# Patient Record
Sex: Female | Born: 1945 | Race: White | Hispanic: No | Smoking: Never smoker
Health system: Southern US, Community
[De-identification: ages and names within clinical notes are randomized; demographics above are authoritative.]

---

## 1999-03-07 ENCOUNTER — Emergency Department (HOSPITAL_COMMUNITY): Admission: EM | Admit: 1999-03-07 | Discharge: 1999-03-08 | Payer: Self-pay | Admitting: Emergency Medicine

## 2006-03-29 ENCOUNTER — Emergency Department (HOSPITAL_COMMUNITY): Admission: EM | Admit: 2006-03-29 | Discharge: 2006-03-30 | Payer: Self-pay | Admitting: Emergency Medicine

## 2009-03-15 ENCOUNTER — Inpatient Hospital Stay (HOSPITAL_COMMUNITY): Admission: EM | Admit: 2009-03-15 | Discharge: 2009-03-19 | Payer: Self-pay | Admitting: Emergency Medicine

## 2009-03-17 ENCOUNTER — Encounter (INDEPENDENT_AMBULATORY_CARE_PROVIDER_SITE_OTHER): Payer: Self-pay | Admitting: General Surgery

## 2009-04-10 ENCOUNTER — Encounter (INDEPENDENT_AMBULATORY_CARE_PROVIDER_SITE_OTHER): Payer: Self-pay

## 2011-01-29 LAB — COMPREHENSIVE METABOLIC PANEL
ALT: 43 U/L — ABNORMAL HIGH (ref 0–35)
AST: 25 U/L (ref 0–37)
AST: 28 U/L (ref 0–37)
AST: 36 U/L (ref 0–37)
AST: 41 U/L — ABNORMAL HIGH (ref 0–37)
Albumin: 2.3 g/dL — ABNORMAL LOW (ref 3.5–5.2)
Albumin: 2.5 g/dL — ABNORMAL LOW (ref 3.5–5.2)
Albumin: 2.6 g/dL — ABNORMAL LOW (ref 3.5–5.2)
Albumin: 3.7 g/dL (ref 3.5–5.2)
Alkaline Phosphatase: 109 U/L (ref 39–117)
Alkaline Phosphatase: 113 U/L (ref 39–117)
Alkaline Phosphatase: 117 U/L (ref 39–117)
Alkaline Phosphatase: 86 U/L (ref 39–117)
BUN: 16 mg/dL (ref 6–23)
BUN: 3 mg/dL — ABNORMAL LOW (ref 6–23)
BUN: 5 mg/dL — ABNORMAL LOW (ref 6–23)
CO2: 23 mEq/L (ref 19–32)
CO2: 25 mEq/L (ref 19–32)
CO2: 27 mEq/L (ref 19–32)
Calcium: 7.8 mg/dL — ABNORMAL LOW (ref 8.4–10.5)
Calcium: 9.1 mg/dL (ref 8.4–10.5)
Chloride: 105 mEq/L (ref 96–112)
Chloride: 105 mEq/L (ref 96–112)
Chloride: 109 mEq/L (ref 96–112)
Creatinine, Ser: 0.6 mg/dL (ref 0.4–1.2)
Creatinine, Ser: 0.87 mg/dL (ref 0.4–1.2)
GFR calc Af Amer: >60 mL/min (ref >60)
GFR calc Af Amer: >60 mL/min (ref >60)
GFR calc Af Amer: >60 mL/min (ref >60)
GFR calc Af Amer: >60 mL/min (ref >60)
GFR calc Af Amer: >60 mL/min (ref >60)
GFR calc non Af Amer: >60 mL/min (ref >60)
Glucose, Bld: 102 mg/dL — ABNORMAL HIGH (ref 70–99)
Glucose, Bld: 147 mg/dL — ABNORMAL HIGH (ref 70–99)
Potassium: 3.2 mEq/L — ABNORMAL LOW (ref 3.5–5.1)
Potassium: 3.4 mEq/L — ABNORMAL LOW (ref 3.5–5.1)
Potassium: 3.6 mEq/L (ref 3.5–5.1)
Sodium: 137 mEq/L (ref 135–145)
Sodium: 137 mEq/L (ref 135–145)
Total Bilirubin: 0.4 mg/dL (ref 0.3–1.2)
Total Bilirubin: 0.7 mg/dL (ref 0.3–1.2)
Total Bilirubin: 1 mg/dL (ref 0.3–1.2)
Total Bilirubin: 1 mg/dL (ref 0.3–1.2)
Total Bilirubin: 1.6 mg/dL — ABNORMAL HIGH (ref 0.3–1.2)
Total Protein: 5.1 g/dL — ABNORMAL LOW (ref 6.0–8.3)
Total Protein: 5.5 g/dL — ABNORMAL LOW (ref 6.0–8.3)

## 2011-01-29 LAB — CBC
HCT: 35.9 % — ABNORMAL LOW (ref 36.0–46.0)
HCT: 37.5 % (ref 36.0–46.0)
Hemoglobin: 13.3 g/dL (ref 12.0–15.0)
MCHC: 33.4 g/dL (ref 30.0–36.0)
MCHC: 34.2 g/dL (ref 30.0–36.0)
MCV: 93.6 fL (ref 78.0–100.0)
MCV: 94.9 fL (ref 78.0–100.0)
Platelets: 258 10*3/uL (ref 150–400)
Platelets: 283 10*3/uL (ref 150–400)
Platelets: 289 10*3/uL (ref 150–400)
RBC: 3.79 MIL/uL — ABNORMAL LOW (ref 3.87–5.11)
RBC: 4.09 MIL/uL (ref 3.87–5.11)
RBC: 4.98 MIL/uL (ref 3.87–5.11)
RDW: 12.9 % (ref 11.5–15.5)
RDW: 13 % (ref 11.5–15.5)
WBC: 12.7 10*3/uL — ABNORMAL HIGH (ref 4.0–10.5)
WBC: 14.9 10*3/uL — ABNORMAL HIGH (ref 4.0–10.5)
WBC: 26.8 10*3/uL — ABNORMAL HIGH (ref 4.0–10.5)

## 2011-01-29 LAB — URINALYSIS, ROUTINE W REFLEX MICROSCOPIC
Glucose, UA: NEGATIVE mg/dL
Ketones, ur: >80 mg/dL — AB
Nitrite: NEGATIVE

## 2011-01-29 LAB — GLUCOSE, CAPILLARY
Glucose-Capillary: 102 mg/dL — ABNORMAL HIGH (ref 70–99)
Glucose-Capillary: 111 mg/dL — ABNORMAL HIGH (ref 70–99)
Glucose-Capillary: 112 mg/dL — ABNORMAL HIGH (ref 70–99)
Glucose-Capillary: 114 mg/dL — ABNORMAL HIGH (ref 70–99)
Glucose-Capillary: 161 mg/dL — ABNORMAL HIGH (ref 70–99)
Glucose-Capillary: 85 mg/dL (ref 70–99)

## 2011-01-29 LAB — URINE CULTURE: Colony Count: 30000

## 2011-01-29 LAB — LIPID PANEL
HDL: 34 mg/dL — ABNORMAL LOW (ref >39)
LDL Cholesterol: 120 mg/dL — ABNORMAL HIGH (ref 0–99)
Triglycerides: 94 mg/dL (ref <150)
VLDL: 19 mg/dL (ref 0–40)

## 2011-01-29 LAB — URINE MICROSCOPIC-ADD ON

## 2011-01-29 LAB — DIFFERENTIAL
Basophils Absolute: 0.3 10*3/uL — ABNORMAL HIGH (ref 0.0–0.1)
Basophils Relative: 1 % (ref 0–1)
Eosinophils Relative: 0 % (ref 0–5)
Lymphocytes Relative: 4 % — ABNORMAL LOW (ref 12–46)
Lymphs Abs: 1.1 10*3/uL (ref 0.7–4.0)
Monocytes Absolute: 0.8 10*3/uL (ref 0.1–1.0)
Neutrophils Relative %: 92 % — ABNORMAL HIGH (ref 43–77)

## 2011-01-29 LAB — AMYLASE: Amylase: 33 U/L (ref 27–131)

## 2011-01-29 LAB — POTASSIUM: Potassium: 3 mEq/L — ABNORMAL LOW (ref 3.5–5.1)

## 2011-01-29 LAB — LIPASE, BLOOD: Lipase: 17 U/L (ref 11–59)

## 2011-01-29 LAB — ETHANOL: Alcohol, Ethyl (B): <5 mg/dL (ref 0–10)

## 2011-03-05 NOTE — Op Note (Signed)
NAMELENOIR, FACCHINI              ACCOUNT NO.:  [id number]   MEDICAL RECORD NO.:  [id number]          PATIENT TYPE:  INP   LOCATION:  1335                         FACILITY:  [facility]   PHYSICIAN:  Juanetta Gosling, MDDATE OF BIRTH:  11-16-1945   DATE OF PROCEDURE:  03/17/2009  DATE OF DISCHARGE:                               OPERATIVE REPORT   PREOPERATIVE DIAGNOSIS:  Gallstone pancreatitis.   POSTOPERATIVE DIAGNOSIS:  Gallstone pancreatitis.   PROCEDURE:  Laparoscopic cholecystectomy with cholangiogram.   SURGEON:  Harden Mo, M.D.   ASSISTANT:  Ollen Gross. Vernell Morgans, M.D.   ANESTHESIA:  General.   FINDINGS:  Chronically scarred and contracted gallbladder with adhesions  to the omentum and duodenum.   SPECIMEN:  Gallbladder contents to pathology.   DRAINS:  A 19-French Blake drain to gallbladder fossa.   ESTIMATED BLOOD LOSS:  50 mL.   COMPLICATIONS:  None.   DISPOSITION:  To recovery room in stable condition.   INDICATIONS:  Mrs. Egley is a 65 year old female who had developed  epigastric pain, nausea and vomiting on Mar 12, 2009, with a subjective  fever.  She was admitted on Mar 15, 2009, after her pain became worse.  She was evaluated and noted to have a white blood cell count of 26,000  and a CT consistent with acute pancreatitis at that time.  She also had  an episode similar to this in 2007.  An ultrasound showed a possible  gallstone, but a very contracted gallbladder and a common bile duct of  8.1 mm.  She also had a lipase of 1863.  She underwent conservative  therapy by the medical team and her pancreatitis has resolved, although  her white blood cell count is still 14, clinically she has completely  resolved this.  Dr. Karie Soda had initially seen her in consult and  recommended a lap cholecystectomy when her pancreatitis had resolved.  I  saw her today for Dr. Michaell Cowing and evaluated her.  I think that she is  ready for  a laparoscopic cholecystectomy.  I  counseled for a lap and  possible open cholecystectomy.   DESCRIPTION OF PROCEDURE:  After an informed consent was obtained, the  patient was taken to the operating room.  She was administered 2 grams  of intravenous cefazolin.  Sequential compression devices were placed on  her lower extremities prior to the operation.  She was then placed under  general endotracheal anesthesia without complication.  Her abdomen was  then prepped and draped in the standard sterile surgical fashion.  A  surgical time-out was then performed.   A 10-mm vertical incision was then made below her umbilicus, and  dissection was carried out down to the level of her fascia.  This was  then grasped with the Kocher clamp and incised sharply.  Her peritoneum  was then entered bluntly with my finger.  A #0-Vicryl pursestring suture  was then placed through the fascia and a Hassan trocar was introduced.  The abdomen was insufflated with 15 mmHg pressure.  She was then placed  in the reverse Trendelenburg position.  Three further  5 mm ports were  placed in the epigastrium and the right side of the abdomen under direct  vision, after infiltration with local anesthetic.  Her gallbladder was  noted to be very densely adherent to the omentum, as well as the  duodenum.  A combination of blunt dissection with clips overlying the  vessels in the omentum, as well as some judicious electrocautery were  used, and the omentum and duodenum were taken down from the gallbladder.  The duodenum was healthy-appearing following this.  Her gallbladder was  very difficult to identify and eventually was able to be retracted  cephalad and a little bit lateral.  A combination of blunt dissection  and suction were then used to identify what appeared to be the cystic  duct.  I eventually was able to obtain a critical view of safety, seeing  the cystic duct and a very small cystic artery next to the duct, as well  as being able to identify  the common bile duct.  The cystic artery was  separated from what appeared to be the cystic duct.  This was clipped  and then divided.  The cystic duct was then very clearly identified.  A  clip was placed distally.  The [facility] catheter was introduced.  A  ductotomy was then made.  The catheter was easily placed into the cystic  duct.  A cholangiogram was then performed, which showed a longer cystic  duct, as well as a very large common bile duct and even intrahepatic  radicals.  Both sides of the liver filled.  The common duct did not  appear to have any stones in it, and there was noted to be some filling  of the duodenum associated with this.  Following this, I then removed  the catheter and placed three clips proximal on the cystic duct and then  divided the cystic duct.  There was a posterior branch of the cystic  artery also identified.  This was clipped and divided as well.  The  gallbladder was removed with electrocautery.  A small hole was made in  the gallbladder, with leakage of bile, but no stones were able leak.  This was all controlled.  The gallbladder was eventually removed off the  bed of the liver some difficulty, due to its adherence to the liver.  This was then placed in an EndoCatch and removed through the umbilicus.  Following this, I then irrigated copiously.  Hemostasis was obtained on  the liver with Bovie electrocautery.  This was clear.  I then placed a  piece of Surgicel on the bed of the liver, down towards the triangle of  Calot, at a couple of areas that I did not want to cauterize.  This was  all hemostatic upon completion.  I placed a 19-French Blake drain  through the right lateral port as well.  All of the fluid was evacuated.  I removed the trocars, after I watched the umbilical trocar get  tied down.  The abdomen was then desufflated.  All wounds were then  closed with #4-0 Monocryl in a subcuticular fashion.  Dermabond was  placed over all these.   She  tolerated this well and was transferred to the recovery room in  stable condition.      Juanetta Gosling, MD  Electronically Signed     MCW/MEDQ  D:  03/17/2009  T:  03/17/2009  Job:  [id number]   cc:   Iva Boop, MD,FACG  [facility]  [address]  [place], [place] [address]   Ardeth Sportsman, MD  [address] [place] [address]

## 2011-03-05 NOTE — Discharge Summary (Signed)
NAMEWESTON, FULCO              ACCOUNT NO.:  [id number]   MEDICAL RECORD NO.:  [id number]          PATIENT TYPE:  INP   LOCATION:  1335                         FACILITY:  [facility]   PHYSICIAN:  Theodosia Paling, MD    DATE OF BIRTH:  02-27-1946   DATE OF ADMISSION:  03/15/2009  DATE OF DISCHARGE:  03/19/2009                               DISCHARGE SUMMARY   PRIMARY CARE PHYSICIAN:  None.   ADMITTING HISTORY:  Please refer to the excellent admission note  dictated by Dr. Derenda Mis under history of present illness.   DISCHARGE DIAGNOSIS:  Acute biliary pancreatitis.   DISCHARGE MEDICATIONS:  Include Percocet 5/325 mg p.o. q.6 hours p.r.n.   HOSPITAL COURSE:  Following issues were addressed during the  hospitalization.  Acute biliary pancreatitis.  The patient received a CT  scan which is consistent with pancreatitis.  Ultrasound showed  gallstones.  [place] gastro[facility] specialists were contrast  contacted for pancreatitis who recommended general surgery as ultrasound  was suggestive of acute cholecystitis.  Dr. Dwain Sarna evaluated the  patient.  On Mar 17, 2009 Dr. Dwain Sarna performed cholecystectomy.  The  patient's postoperative period was without event.  The patient tolerated  the surgery very well.  At the time of discharge the patient is  hemodynamically stable and clinically asymptomatic.   CONSULTATION:  Performed by [facility] and by Dr. Sondra Come for biliary  pancreatitis.   IMAGING PERFORMED:  As mentioned in HPI including IOP intraoperative  cholangiogram showing dilated intra and extrahepatic biliary tree.  Ultrasound of the abdomen showing gallstones, small, contracted  gallbladder.  CT scan was consistent with pancreatitis and extensive  diverticular changes in sigmoid colon.   DISPOSITION:  The patient will follow up with Dr. Dwain Sarna in 2 to 3  weeks' time.  The patient will also establish a primary care physician  for cancer screening and general well  being examination.  Total time  spent in discharge of this patient, 45 minutes.      Theodosia Paling, MD  Electronically Signed     NP/MEDQ  D:  03/19/2009  T:  03/19/2009  Job:  [id number]   cc:   Juanetta Gosling, MD  [address]  [place] [address]

## 2011-03-05 NOTE — Consult Note (Signed)
NAMEKHILEY, LIESER              ACCOUNT NO.:  [id number]   MEDICAL RECORD NO.:  [id number]          PATIENT TYPE:  INP   LOCATION:  1335                         FACILITY:  [facility]   PHYSICIAN:  Ardeth Sportsman, MD     DATE OF BIRTH:  10-23-1945   DATE OF CONSULTATION:  03/16/2009  DATE OF DISCHARGE:                                 CONSULTATION   REASON FOR CONSULTATION:  Recurrent acute pancreatitis, question biliary  etiology.   HISTORY OF PRESENT ILLNESS:  Sandra Parrish is a pleasant 65 year old female  who tends to avoid doctors who has had intermittent episodes of nausea  and vomiting and abdominal pain in the past. She had an episode in 2007.  Came to the emergency room and had an elevated amylase and lipase and  evaluation concerning for an ultrasound that showed a contracted  gallbladder with possible stones. It was thought to perhaps be biliary  in nature. Recommendation was made for admission but the compromise was  made when she followed up with gastroenterology with Dr. Jeani Hawking.  She does not recall if she ever did this and there are no records that I  can find.   She had recovered since that time and 5 days ago, started having severe  upper abdominal epigastric pain with severe nausea, vomiting, and  retching. She thought perhaps she had a stomach flu and tried transition  to liquids only but it got to the point where she could not keep things  down and came to the emergency room and was admitted yesterday via  hospital service after a CT scan showed evidence of pancreatitis.  Interesting, her lipase was not elevated at this time and her ALT was  only slightly elevated. Her white count was 23,000. Over the past 24  hours, she is feeling better with the pain decreased with the help of  some narcotics, although she is not using as much and she is starting to  tolerate some sips. She is walking around okay.   The patient denies any history of any alcohol consumption.  Had a  negative alcohol level when she came in. No history of any lipid  disorders and has normal triglycerides at this time. No history of  spider bites.   MEDICATIONS:  She is on no medications at all.   FAMILY HISTORY:  She has a family notable for a mother who had gallstone  problems in her 6's and she passed away from Alzheimer's. Father has  history of diabetes.   The patient said she denies any history of biliary colic and no  postprandial nausea and vomiting. She tends to avoid heavy greasy foods  anyway but she also said that is a personal choice. Cannot really tells  me why she avoids them. Denies any heartburn or reflux. No dysphagia to  solids or liquids. No hematochezia or melena. She normally has a bowel  movement about every day. No history of irritable bowel syndrome, IBD or  Crohn's. No history of stomach ulcers. No history of pneumonias.  Otherwise, she has pretty good exercise tolerance.   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 She has  not seen a physician in over 10 years.   PAST SURGICAL HISTORY:  She denies, although she did have an ankle  fracture in the past.   PAST GYN HISTORY:  Last menstrual period was 12 years ago. Last pap  smear was 11 years ago. She has not had a breast examination or  mammogram since age 89, which she tells me was negative.   ALLERGIES:  NO KNOWN DRUG ALLERGIES.   MEDICATIONS:  She occasionally takes some aspirin on account of  headaches, but rarely does that. She has not been on any other  medications recently.   REVIEW OF SYSTEMS:  As noted per HPI.  CONSTITUTION:  No fever, chills,  or sweats. No weight gain or weight loss. PSYCHIATRIC:  Denies history  of any auditory or visual hallucinations. No history of depression or  anxiety. HEENT:  Eyes, no double vision, blurred vision, or loss. No ear  drainage. No sore throats or recent sinusitis. NEUROLOGIC:  No focal  weakness or deficits.  MUSCULOSKELETAL:  No joint pain or recent injury.  PULMONARY:  No productive cough or sputum. No history of asthma or  wheezing. CARDIOVASCULAR:  No exertional chest pain or palpitations.  HEME/LYMPH/ALLERGIC/DERMATOLOGIC/BREAST:  Negative. GYN:  As per HPI,  otherwise negative.   SOCIAL HISTORY:  She works at the __________. She has a distant history  of tobacco, probably about 20 pack years but has not smoked in several  decades. She claims that she has occasional glass of wine. She denies  drinking any alcohol in the past few weeks. She is divorced without any  children.   PHYSICAL EXAMINATION:  VITAL SIGNS:  She has been afebrile since she has  been in the hospital and on ER evaluation. Current temperature is 98.5.  Pulse is around 80. Respiratory rate 16, blood pressure 151/85. She is  97% saturation on room air. Her glucose is 89 to 105.  GENERAL:  A well developed, well nourished, overweight female lying in  bed in no acute distress.  PSYCHIATRIC:  She is pleasant and interactive. Tends to be talkative but  no pressured speech.  NEUROLOGIC:  Cranial nerves 2-12 are intact and grossly 5/5 and equal  and symmetrical. No rest or intention tremors. Eyes:  Pupils are equal,  round, and reactive to light. Sclerae nonicteric or injected.  NECK:  Supple. No masses. Trachea is midline.  CHEST:  Clear to auscultation bilaterally. No wheezes, rales, or  rhonchi. No pain to rib or sternal compression.  HEENT:  Normocephalic. Moist mucous membranes. Oropharynx are clear.  HEART:  Regular rate and rhythm. No murmur, rub, or gallop.  ABDOMEN:  Soft and obese but with only some mild epigastric discomfort.  No Murphy sign. No focal tenderness. No umbilical hernia.  GENITOURINARY:  No inguinal hernias, no obvious vaginal discharge.  RECTAL:  Deferred.  MUSCULOSKELETAL:  Normal range of motion of the shoulders, elbows,  wrists, as well as hips, knees, and ankles.  SKIN:  No obvious petechiae or  purpura. No obvious jaundice. No spider  angiomas or telangiectasias.  LYMPH:  No head, neck, axillary or groin lymphadenopathy.  BACK:  No pain on cervical, thoracolumbar, sacral spine or costophrenic  angle on palpation.  BREAST:  Deferred.   LABORATORY DATA:  Her current comprehensive metabolic panel was rather  under-whelming except for a mildly elevated ALT. Total bilirubin was 1.6  when she came in and now it  is 1. Alkaline phosphatase was normal.  Hemoglobin A1C is normal. Triglycerides are 94. White count on initial  presentation was 26 and today is 17.8.   CT scan does show some phlegmonous changes around the head and neck of  her pancreas and no discrete mass suspicious for pancreatitis. I do not  see any obvious pseudocyst. Body of pancreas is involved.  I do not see  any ductal dilatation. It is a challenge to see a contracted gallbladder  but is very small. Her duodenal sweep looks normal with no air or mass.  There is no evidence of any bowel obstruction or hernia. Her liver,  otherwise looks okay with no discrete masses.   Ultrasound shows a contracted gallbladder with a question of a stone in  it, although it is kind of hard to tell since it is so small and maybe  confused with duodenum.   ASSESSMENT/PLAN:  A 65 year old female with elevated bilirubin and  pancreatitis, suspicious for gallstone pancreatitis. There is a prior  history of elevated lipase and amylase.  1. Continue IV fluids.  2. Advised diet as tolerated.  3. Make NPO after midnight.  4. Diagnostic laparoscopy with laparoscopic cholecystectomy and      cholangiogram.   Anatomy and physiology of hepatobiliary and pancreatic function was  discussed. Pathophysiology of cholecystolithiasis with natural history  risks were discussed. Options and recommendations were made for  laparoscopic study of the gallbladder and study of bile ducts. Risks,  benefits, and alternatives were discussed. My concern is  that she has  already had a couple of attacks adn alcohol has been disproven.  Triglycerides have been disproven. There is no other medication to blame  or other behaviors to blame and she does have and elevated bilirubin on  this admission. I think the risk of surgery in her are low and the risk  of another attack are significant. She is anxious to proceed with  surgery.   I would like to wait until her white count is more normalized and her  pain has gone down. Hopefully, this can happen within 1 to 2 days,  depending on how she is feeling. My partners will be available to help  cover for this later in the week.      Ardeth Sportsman, MD  Electronically Signed     SCG/MEDQ  D:  03/16/2009  T:  03/16/2009  Job:  [id number]   cc:   Dr. Larey Days - [facility]   Iva Boop, MD,FACG  [facility]  [address] [place], [place] [address]   Jordan Hawks. Elnoria Howard, MD  Fax: [phone]

## 2011-03-05 NOTE — H&P (Signed)
NAME:  Sandra Parrish, CAMPUS              ACCOUNT NO.:  [id number]   MEDICAL RECORD NO.:  [id number]          PATIENT TYPE:  EMS   LOCATION:  ED                           FACILITY:  [facility]   PHYSICIAN:  Melissa L. Ladona Ridgel, MD  DATE OF BIRTH:  [birth year]   DATE OF ADMISSION:  03/15/2009  DATE OF DISCHARGE:                              HISTORY & PHYSICAL   CHIEF COMPLAINT:  Stomach flu.   HISTORY OF PRESENT ILLNESS:  The patient is a 65 year old white female  who stated on Sunday she developed symptoms of what she thought was a  stomach flu.  Symptoms consisted of nausea, vomiting.  She persisted  in having the nausea and vomiting through the first of the weekend.  Had  some associated pain with it intermittently.  She was unable to keep  anything down including Jello or fluids.  The patient came to the  emergency room for further evaluation and was found to have white count  of 26,000.  A CT scan was obtained showing acute pancreatitis.  The  patient describes the pain as intermittent, currently not present, but  located across the top of her stomach.  Food made it worse.   REVIEW OF SYSTEMS:  CONSTITUTIONAL:  Her weight is the same.  There is  no fever or chills.  Eyes:  No blurred vision, double vision.  EARS/NOSE/THROAT:  No tinnitus, dysphagia or discharge.  CARDIOVASCULAR:  No chest pain or palpitations.  PULMONARY:  No shortness of breath or  cough.  GI:  Positive nausea.  Positive vomiting.  No diarrhea.  Her  last bowel movement was on Saturday, it was formed.  GU:  No hesitancy,  frequency or dysuria.  NEUROLOGICAL:  No headaches or seizure disorder.  MUSCULOSKELETAL:  No deformity or pain.  HEMATOLOGIC:  No bleeding  disorder.  PSYCHIATRIC:  No depression or anxiety.   ALLERGIES:  NO KNOWN DRUG ALLERGIES.   MEDICATIONS:  She takes no medications except for an occasional aspirin  over-the-counter for headache.   PAST MEDICAL HISTORY:  She had an ankle fracture.  Her last  menstrual  period was 12 years ago.  Her last Pap was at the age of 21.  Her last  breast exam was at the age of 72.  She has not seen a primary care  physician since approximately 10-11 years.   PAST SURGICAL HISTORY:  None.   Reviewed the old records and indicates that the patient had a bout of  abdominal pain back in 2007 for which she was seen in the emergency  room.  At that time, her LFTs were slightly elevated.  Her lipase was  elevated at 14,000 and she clinically had evidence for pancreatitis.  Evidently, the patient left the emergency room against medical advice  before being seen by a GI physician.  The patient does not recall why  she left.  She does not recall what occurred after the fact, but  evidently she got better.   FAMILY HISTORY:  Dad is deceased with history of diabetes.  Mom is  deceased with history of Alzheimer's.  SOCIAL HISTORY:  She is divorced.  She has no children.  She works as a  Diplomatic Services operational officer for [employer].  The patient quit tobacco  many years ago.  She has an occasional glass of wine.   PHYSICAL EXAMINATION:  VITAL SIGNS:  Temperature 98.6, blood pressure  118/57, pulse 83, respirations 16, saturation 94%.  GENERAL:  She is in no acute distress at present.  HEENT:  Normocephalic, atraumatic.  Pupils are equal, round and reactive  to light.  Extraocular muscles are intact.  She has anicteric sclerae.  Examination of the ears reveal tympanic membranes are obscured by  cerumen on the right.  TM on the left is clear.  No discharge.  Examination does reveal septum midline without discharge.  Examination  of the mouth reveals no oral lesions.  Dentition is intact.  There are  no lip lesions.  NECK:  Supple.  There is no JVD.  No lymph nodes.  No carotid bruits.  CHEST:  Clear to auscultation.  There are no rhonchi, rales or wheezes.  CARDIOVASCULAR:  Regular rate and rhythm.  Positive S1 and S2.  No S3 or  S4.  No murmurs, rubs or  gallops.  ABDOMEN:  Soft.  Currently she is nontender.  She has only a rare  occasional bowel sound.  There is no guarding.  There is no rebound.  There is no hepatosplenomegaly.  I do not appreciate any bruits.  EXTREMITIES:  No clubbing, cyanosis or edema.  She has a mildly deformed  hallux  bilaterally of the feet, otherwise no lesions are noted.  She  has 2+ pulses.  NEUROLOGIC:  She is awake, alert, oriented.  Cranial nerves II-XII are  intact.  Power is 5/5.  DTRs are 2+.  Plantars are downgoing.  PSYCHIATRIC:  Affect is appropriate.  Recent and remote memory are  intact.  Judgment and insight are intact.   LABORATORY:  White count is 26.8 with hemoglobin of 16.5, hematocrit  46.6, platelets of 347, sodium is 137, potassium 3.2, chloride 100, CO2  25, BUN 16, creatinine 0.87, glucose is 147, lipase is 17.  UA shows  small leukoesterase with 3-5 WBCs.   ASSESSMENT/PLAN:  This is a 65 year old white female who presents with  abdominal pain, nausea and vomiting that started over the weekend and  persisted.  In the emergency room, the patient is found to have an  elevated white blood cell count.  A CT scan was obtained which shows  diffuse pancreatitis.  The patient had an ED visit in 2007 for the same  issue, but has never been seen or evaluated for this.  1. Pancreatitis.  I will go ahead and check a STAT ultrasound to rule      out obstructing stone and look for masses.  There is no evidence      for a cyst on the CT scan.  Keep her n.p.o. except for may be      occasional ice chips and more IV fluid to hydrate her.  I will      check a lipid panel.  I will check an ethanol level.  I have      consulted [facility] because of the atypical presentation without      elevation in LFTs or lipase in this case.  I want to make sure that      we do not miss a possible obstructing lesion or other ominous cause      for her pancreatitis.  2. Genitourinary:  Slightly positive urinalysis.   In light of her      white count, we will go ahead and treat her with Levaquin.  We will      follow up the culture and discontinue if necessary.  3. Nausea with vomiting secondary to number 1.  We will treat her with      Zofran and discover the problem that is causing her pancreatitis.  4. Deep venous thrombosis prophylaxis.  Agree with either Lovenox or      heparin.   Total time in this case was from 1:15-2:15.      Melissa L. Ladona Ridgel, MD  Electronically Signed     MLT/MEDQ  D:  03/15/2009  T:  03/15/2009  Job:  [id number]   cc:   [facility]

## 2019-09-06 ENCOUNTER — Emergency Department (HOSPITAL_COMMUNITY): Payer: Self-pay

## 2019-09-06 ENCOUNTER — Other Ambulatory Visit: Payer: Self-pay

## 2019-09-06 ENCOUNTER — Emergency Department (HOSPITAL_COMMUNITY)
Admission: EM | Admit: 2019-09-06 | Discharge: 2019-09-07 | Disposition: A | Discharge status: Home / Self Care | Payer: Self-pay | Attending: Emergency Medicine | Admitting: Emergency Medicine

## 2019-09-06 DIAGNOSIS — R109 Unspecified abdominal pain: Secondary | ICD-10-CM | POA: Insufficient documentation

## 2019-09-06 DIAGNOSIS — Y999 Unspecified external cause status: Secondary | ICD-10-CM | POA: Insufficient documentation

## 2019-09-06 DIAGNOSIS — Z91040 Latex allergy status: Secondary | ICD-10-CM | POA: Insufficient documentation

## 2019-09-06 DIAGNOSIS — S0083XA Contusion of other part of head, initial encounter: Secondary | ICD-10-CM | POA: Insufficient documentation

## 2019-09-06 DIAGNOSIS — Y93E5 Activity, floor mopping and cleaning: Secondary | ICD-10-CM | POA: Insufficient documentation

## 2019-09-06 DIAGNOSIS — M549 Dorsalgia, unspecified: Secondary | ICD-10-CM

## 2019-09-06 DIAGNOSIS — Y92008 Other place in unspecified non-institutional (private) residence as the place of occurrence of the external cause: Secondary | ICD-10-CM | POA: Insufficient documentation

## 2019-09-06 DIAGNOSIS — Z23 Encounter for immunization: Secondary | ICD-10-CM | POA: Insufficient documentation

## 2019-09-06 DIAGNOSIS — W19XXXA Unspecified fall, initial encounter: Secondary | ICD-10-CM

## 2019-09-06 DIAGNOSIS — R519 Headache, unspecified: Secondary | ICD-10-CM | POA: Insufficient documentation

## 2019-09-06 DIAGNOSIS — S7001XA Contusion of right hip, initial encounter: Secondary | ICD-10-CM | POA: Insufficient documentation

## 2019-09-06 DIAGNOSIS — W010XXA Fall on same level from slipping, tripping and stumbling without subsequent striking against object, initial encounter: Secondary | ICD-10-CM | POA: Insufficient documentation

## 2019-09-06 LAB — CBC WITH DIFFERENTIAL/PLATELET
Abs Immature Granulocytes: 0.04 10*3/uL (ref 0.00–0.07)
Basophils Absolute: 0.1 10*3/uL (ref 0.0–0.1)
Basophils Relative: 1 %
Eosinophils Absolute: 0.1 10*3/uL (ref 0.0–0.5)
Eosinophils Relative: 0 %
HCT: 41.6 % (ref 36.0–46.0)
Hemoglobin: 14 g/dL (ref 12.0–15.0)
Immature Granulocytes: 0 %
Lymphocytes Relative: 7 %
Lymphs Abs: 1 10*3/uL (ref 0.7–4.0)
MCH: 30.9 pg (ref 26.0–34.0)
MCHC: 33.7 g/dL (ref 30.0–36.0)
MCV: 91.8 fL (ref 80.0–100.0)
Monocytes Absolute: 0.3 10*3/uL (ref 0.1–1.0)
Monocytes Relative: 2 %
Neutro Abs: 12.2 10*3/uL — ABNORMAL HIGH (ref 1.7–7.7)
Neutrophils Relative %: 90 %
Platelets: 327 10*3/uL (ref 150–400)
RBC: 4.53 MIL/uL (ref 3.87–5.11)
RDW: 12.1 % (ref 11.5–15.5)
WBC: 13.6 10*3/uL — ABNORMAL HIGH (ref 4.0–10.5)
nRBC: 0 % (ref 0.0–0.2)

## 2019-09-06 LAB — I-STAT CHEM 8, ED
BUN: 12 mg/dL (ref 8–23)
Calcium, Ion: 1.14 mmol/L — ABNORMAL LOW (ref 1.15–1.40)
Chloride: 106 mmol/L (ref 98–111)
Creatinine, Ser: 0.5 mg/dL (ref 0.44–1.00)
Glucose, Bld: 119 mg/dL — ABNORMAL HIGH (ref 70–99)
HCT: 41 % (ref 36.0–46.0)
Hemoglobin: 13.9 g/dL (ref 12.0–15.0)
Potassium: 3.8 mmol/L (ref 3.5–5.1)
Sodium: 140 mmol/L (ref 135–145)
TCO2: 22 mmol/L (ref 22–32)

## 2019-09-06 LAB — COMPREHENSIVE METABOLIC PANEL
ALT: 19 U/L (ref 0–44)
AST: 28 U/L (ref 15–41)
Albumin: 3.5 g/dL (ref 3.5–5.0)
Alkaline Phosphatase: 70 U/L (ref 38–126)
Anion gap: 13 (ref 5–15)
BUN: 11 mg/dL (ref 8–23)
CO2: 21 mmol/L — ABNORMAL LOW (ref 22–32)
Calcium: 8.7 mg/dL — ABNORMAL LOW (ref 8.9–10.3)
Chloride: 105 mmol/L (ref 98–111)
Creatinine, Ser: 0.59 mg/dL (ref 0.44–1.00)
GFR calc Af Amer: >60 mL/min (ref >60)
GFR calc non Af Amer: >60 mL/min (ref >60)
Glucose, Bld: 120 mg/dL — ABNORMAL HIGH (ref 70–99)
Potassium: 3.8 mmol/L (ref 3.5–5.1)
Sodium: 139 mmol/L (ref 135–145)
Total Bilirubin: 0.9 mg/dL (ref 0.3–1.2)
Total Protein: 6.6 g/dL (ref 6.5–8.1)

## 2019-09-06 MED ORDER — HYDROCODONE-ACETAMINOPHEN 5-325 MG PO TABS: 1.0000 | ORAL_TABLET | Freq: Four times a day (QID) | ORAL | 0 refills | Status: DC | PRN

## 2019-09-06 MED ORDER — TETANUS-DIPHTH-ACELL PERTUSSIS 5-2.5-18.5 LF-MCG/0.5 IM SUSP
0.5000 mL | Freq: Once | INTRAMUSCULAR | Status: AC
  Administered 2019-09-06: 0.5 mL via INTRAMUSCULAR
  Filled 2019-09-06: qty 0.5

## 2019-09-06 MED ORDER — IOHEXOL 300 MG/ML  SOLN
100.0000 mL | Freq: Once | INTRAMUSCULAR | Status: AC | PRN
  Administered 2019-09-06: 100 mL via INTRAVENOUS

## 2019-09-06 MED ORDER — MORPHINE SULFATE (PF) 4 MG/ML IV SOLN
4.0000 mg | Freq: Once | INTRAVENOUS | Status: AC
  Administered 2019-09-06: 4 mg via INTRAVENOUS
  Filled 2019-09-06: qty 1

## 2019-09-06 NOTE — Discharge Instructions (Addendum)
You bruised your hip and back   Take motrin for pain   Take vicodin for severe pain. Do NOT drive with it   See your doctor  Return to ER if you have worse back pain, hip pain, difficulty walking

## 2019-09-06 NOTE — ED Triage Notes (Signed)
Pt endorses mechanical fall when she tripped over some patio furniture landing on her right side. No loc. Not on blood thinners. Axox4. Complains of right hip pain and lower leg pain on palpation as well as right hand pain. VSS

## 2019-09-06 NOTE — ED Provider Notes (Signed)
[facility] EMERGENCY DEPARTMENT Provider Note   CSN: [id number] Arrival date & time: 09/06/19  1229     History   Chief Complaint Chief Complaint  Patient presents with   Fall    HPI [place] is a 73 y.o. female history of arthritis here presenting with a fall .  She states that she was on the porch and was cleaning some leaves off and tripped and fell and hit her face as well as right hand and right hip.  She states that she was laying on the floor unable to get up or bear any weight on her leg afterwards.  Patient was noted to have abrasion on the nose and states that her tetanus shot was over 20 years ago.     The history is provided by the patient.    No past medical history on file.  There are no active problems to display for this patient.    OB History   No obstetric history on file.      Home Medications    Prior to Admission medications   Medication Sig Start Date End Date Taking? Authorizing Provider  aspirin EC 325 MG tablet Take 650 mg by mouth daily as needed (pain/headache).   Yes [provider]    Family History No family history on file.  Social History Social History   Tobacco Use   Smoking status: Not on file  Substance Use Topics   Alcohol use: Not on file   Drug use: Not on file     Allergies   Latex   Review of Systems Review of Systems  Musculoskeletal:       R hip pain, R hand pain   All other systems reviewed and are negative.    Physical Exam Updated Vital Signs BP 109/90 (BP Location: Left Arm)    Pulse 71    Temp 98.7 F (37.1 C) (Oral)    Resp 16    SpO2 98%   Physical Exam Vitals signs and nursing note reviewed.  HENT:     Head: Normocephalic.     Nose:     Comments: Abrasion bridge of nose, no septal deviation     Mouth/Throat:     Mouth: Mucous membranes are moist.  Eyes:     Extraocular Movements: Extraocular movements intact.  Neck:     Musculoskeletal: Normal  range of motion and neck supple.     Comments: No midline tenderness  Cardiovascular:     Rate and Rhythm: Normal rate and regular rhythm.     Pulses: Normal pulses.     Heart sounds: Normal heart sounds.  Pulmonary:     Effort: Pulmonary effort is normal.     Breath sounds: Normal breath sounds.  Abdominal:     General: Abdomen is flat.     Palpations: Abdomen is soft.     Comments: Mild tenderness in RUQ. No obvious bruising   Musculoskeletal:     Comments: Dec ROM R hip, mild R SI joint vs paralumbar tenderness   Skin:    General: Skin is warm.     Capillary Refill: Capillary refill takes less than 2 seconds.  Neurological:     General: No focal deficit present.     Mental Status: She is alert.  Psychiatric:        Mood and Affect: Mood normal.        Behavior: Behavior normal.      ED Treatments / Results  Labs (all labs ordered are listed, but only abnormal results are displayed) Labs Reviewed  CBC WITH DIFFERENTIAL/PLATELET - Abnormal; Notable for the following components:      Result Value   WBC 13.6 (*)    Neutro Abs 12.2 (*)    All other components within normal limits  COMPREHENSIVE METABOLIC PANEL - Abnormal; Notable for the following components:   CO2 21 (*)    Glucose, Bld 120 (*)    Calcium 8.7 (*)    All other components within normal limits  I-STAT CHEM 8, ED - Abnormal; Notable for the following components:   Glucose, Bld 119 (*)    Calcium, Ion 1.14 (*)    All other components within normal limits    EKG None  Radiology Ct Head Wo Contrast  Result Date: 09/06/2019 CLINICAL DATA:  Mechanical fall no loss of consciousness or anticoagulation. EXAM: CT HEAD WITHOUT CONTRAST CT CERVICAL SPINE WITHOUT CONTRAST TECHNIQUE: Multidetector CT imaging of the head and cervical spine was performed following the standard protocol without intravenous contrast. Multiplanar CT image reconstructions of the cervical spine were also generated. COMPARISON:  None.  FINDINGS: CT HEAD FINDINGS Brain: No evidence of acute infarction, hemorrhage, hydrocephalus, extra-axial collection or mass lesion/mass effect. Symmetric prominence of the ventricles, cisterns and sulci compatible with parenchymal volume loss. Patchy areas of white matter hypoattenuation are most compatible with chronic microvascular angiopathy. Vascular: Atherosclerotic calcification of the carotid siphons. No hyperdense vessel. Skull: Midline frontal scalp swelling/contusion with small hematoma measuring 4 mm in maximal thickness. Sinuses/Orbits: Minimal mural thickening in the paranasal sinuses. Mastoid air cells are clear. Included orbital structures are unremarkable. Other: None CT CERVICAL SPINE FINDINGS Alignment: Straightening and slight reversal of the normal cervical lordosis centered at C5-6. No abnormal facet widening. Normal alignment of the craniocervical and atlantoaxial articulations. Skull base and vertebrae: No acute fracture. No primary bone lesion or focal pathologic process. Soft tissues and spinal canal: No pre or paravertebral fluid or swelling. No visible canal hematoma. Mild pannus formation posterior to the dens. Disc levels: Multilevel intervertebral disc height loss with spondylitic endplate changes. Diffuse facet hypertrophic changes and uncinate spurring are present as well. Features maximal at C5-6 and C6-7 with at least mild-to-moderate canal stenoses and moderate to severe bilateral neural foraminal narrowing at these levels. Upper chest: Atelectatic and hypoventilatory changes noted in the upper chest. No acute worse some abnormality. Aortic arch calcifications are noted. Other: Normal thyroid gland. IMPRESSION: 1. No acute intracranial abnormality. 2. Minimal parenchymal volume loss and chronic white matter angiopathy changes. 3. Midline frontal scalp swelling/contusion with small hematoma measuring 4 mm in maximal thickness. No calvarial fracture. 4. No acute cervical spine  fracture or traumatic listhesis. 5. Multilevel degenerative changes maximal at C5-6 and C6-7 with at least mild-to-moderate canal stenoses and moderate to severe bilateral neural foraminal narrowing at these levels. 6.  Aortic Atherosclerosis (ICD10-I70.0). Electronically Signed   By: Kreg Shropshire M.D.   On: 09/06/2019 14:06   Ct Cervical Spine Wo Contrast  Result Date: 09/06/2019 CLINICAL DATA:  Mechanical fall no loss of consciousness or anticoagulation. EXAM: CT HEAD WITHOUT CONTRAST CT CERVICAL SPINE WITHOUT CONTRAST TECHNIQUE: Multidetector CT imaging of the head and cervical spine was performed following the standard protocol without intravenous contrast. Multiplanar CT image reconstructions of the cervical spine were also generated. COMPARISON:  None. FINDINGS: CT HEAD FINDINGS Brain: No evidence of acute infarction, hemorrhage, hydrocephalus, extra-axial collection or mass lesion/mass effect. Symmetric prominence of the ventricles, cisterns and sulci  compatible with parenchymal volume loss. Patchy areas of white matter hypoattenuation are most compatible with chronic microvascular angiopathy. Vascular: Atherosclerotic calcification of the carotid siphons. No hyperdense vessel. Skull: Midline frontal scalp swelling/contusion with small hematoma measuring 4 mm in maximal thickness. Sinuses/Orbits: Minimal mural thickening in the paranasal sinuses. Mastoid air cells are clear. Included orbital structures are unremarkable. Other: None CT CERVICAL SPINE FINDINGS Alignment: Straightening and slight reversal of the normal cervical lordosis centered at C5-6. No abnormal facet widening. Normal alignment of the craniocervical and atlantoaxial articulations. Skull base and vertebrae: No acute fracture. No primary bone lesion or focal pathologic process. Soft tissues and spinal canal: No pre or paravertebral fluid or swelling. No visible canal hematoma. Mild pannus formation posterior to the dens. Disc levels:  Multilevel intervertebral disc height loss with spondylitic endplate changes. Diffuse facet hypertrophic changes and uncinate spurring are present as well. Features maximal at C5-6 and C6-7 with at least mild-to-moderate canal stenoses and moderate to severe bilateral neural foraminal narrowing at these levels. Upper chest: Atelectatic and hypoventilatory changes noted in the upper chest. No acute worse some abnormality. Aortic arch calcifications are noted. Other: Normal thyroid gland. IMPRESSION: 1. No acute intracranial abnormality. 2. Minimal parenchymal volume loss and chronic white matter angiopathy changes. 3. Midline frontal scalp swelling/contusion with small hematoma measuring 4 mm in maximal thickness. No calvarial fracture. 4. No acute cervical spine fracture or traumatic listhesis. 5. Multilevel degenerative changes maximal at C5-6 and C6-7 with at least mild-to-moderate canal stenoses and moderate to severe bilateral neural foraminal narrowing at these levels. 6.  Aortic Atherosclerosis (ICD10-I70.0). Electronically Signed   By: Kreg Shropshire M.D.   On: 09/06/2019 14:06   Ct Abdomen Pelvis W Contrast  Result Date: 09/06/2019 CLINICAL DATA:  Abdominal trauma EXAM: CT ABDOMEN AND PELVIS WITH CONTRAST TECHNIQUE: Multidetector CT imaging of the abdomen and pelvis was performed using the standard protocol following bolus administration of intravenous contrast. CONTRAST:  OMNIPAQUE IOHEXOL 300 MG/ML  SOLN COMPARISON:  01/13/2009 FINDINGS: Lower chest: Lung bases are clear. No effusions. Heart is normal size. Hepatobiliary: Prior cholecystectomy. Intrahepatic and extrahepatic biliary ductal dilatation. Common bile duct measures up to 13 mm. This may be related to patient's age and post cholecystectomy state. No focal hepatic abnormality. Pancreas: No focal abnormality or ductal dilatation. Spleen: No focal abnormality.  Normal size. Adrenals/Urinary Tract: No adrenal abnormality. No focal renal  abnormality. No stones or hydronephrosis. Urinary bladder is unremarkable. Stomach/Bowel: Extensive left colonic diverticulosis. No active diverticulitis. Normal appendix. Stomach and small bowel decompressed, unremarkable. Vascular/Lymphatic: Aortic atherosclerosis. No enlarged abdominal or pelvic lymph nodes. Reproductive: Uterus and adnexa unremarkable.  No mass. Other: No free fluid or free air. Musculoskeletal: Degenerative changes in the lumbar spine. No acute bony abnormality. IMPRESSION: No evidence of solid organ injury. Biliary ductal dilatation which may be related to post cholecystectomy state. Recommend correlation with LFTs. Extensive left colonic diverticulosis.  No active diverticulitis. Aortic atherosclerosis. No acute findings. Electronically Signed   By: Charlett Nose M.D.   On: 09/06/2019 20:55   Ct L-spine No Charge  Result Date: 09/06/2019 CLINICAL DATA:  Acute presentation with back pain. EXAM: CT LUMBAR SPINE WITHOUT CONTRAST TECHNIQUE: Multidetector CT imaging of the lumbar spine was performed without intravenous contrast administration. Multiplanar CT image reconstructions were also generated. COMPARISON:  None. FINDINGS: Segmentation: 5 lumbar type vertebral bodies. Alignment: Curvature convex to the left with the apex at L2-3. Vertebrae: No fracture or primary bone lesion. Chronic discogenic endplate changes. Paraspinal  and other soft tissues: See results of CT abdomen pelvis. Disc levels: T12-L1 and L1-2: Negative. L2-3: Chronic disc degeneration with vacuum phenomenon. Endplate osteophytes and bulging of the disc. No central canal stenosis. Lateral recess and foraminal narrowing on the right. L3-4: Disc degeneration with vacuum phenomenon. Prominent right-sided osteophytes. Bulging of the disc. Narrowing of the right lateral recess and intervertebral foramen that could cause right-sided pain. L4-5: Disc degeneration with vacuum phenomenon. Endplate osteophytes and bulging of the  disc. Narrowing of the lateral recesses and both foramina. Neural compression could occur on either side at this level. L5-S1: Disc degeneration with vacuum phenomenon. Mild bulging of the disc. Endplate osteophytes worse on the left with lateral recess and foraminal stenosis that could cause left-sided neural compression. IMPRESSION: No acute lumbar region finding. Chronic scoliotic curvature convex to the left the apex at L2-3. Right-sided predominant degenerative changes at L2-3 and L3-4 that could cause right-sided neural compression. Degenerative disease at L4-5 with narrowing of both lateral recesses and neural foramina that could cause neural compression on either or both sides. Left-sided predominant degenerative changes at L5-S1 with lateral recess and foraminal stenosis that could cause left-sided radicular pain. Certainly, the findings in general could be associated with back pain. Electronically Signed   By: Paulina Fusi M.D.   On: 09/06/2019 20:56   Dg Hand Complete Right  Result Date: 09/06/2019 CLINICAL DATA:  73 year old female with fall and trauma to the right hand. EXAM: RIGHT HAND - COMPLETE 3+ VIEW COMPARISON:  None. FINDINGS: There is no acute fracture or dislocation. The bones are osteopenic. Mild narrowing of the DIP joint spaces. The soft tissues are unremarkable. IMPRESSION: No acute fracture or dislocation. Electronically Signed   By: Elgie Collard M.D.   On: 09/06/2019 13:44   Dg Hip Unilat With Pelvis 2-3 Views Right  Result Date: 09/06/2019 CLINICAL DATA:  Status post fall, groin pain EXAM: DG HIP (WITH OR WITHOUT PELVIS) 2-3V RIGHT COMPARISON:  None. FINDINGS: Generalized osteopenia. No acute fracture or dislocation. Mild 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 IMPRESSION: No acute osseous injury of the right hip. Given the patient's age and osteopenia, if there is persistent clinical concern for an occult hip fracture, a MRI of the hip is recommended  for increased sensitivity. Electronically Signed   By: Elige Ko   On: 09/06/2019 13:43    Procedures Procedures (including critical care time)  Medications Ordered in ED Medications  morphine 4 MG/ML injection 4 mg (4 mg Intravenous Given 09/06/19 1646)  Tdap (BOOSTRIX) injection 0.5 mL (0.5 mLs Intramuscular Given 09/06/19 1646)  iohexol (OMNIPAQUE) 300 MG/ML solution 100 mL (100 mLs Intravenous Contrast Given 09/06/19 2006)     Initial Impression / Assessment and Plan / ED Course  I have reviewed the triage vital signs and the nursing notes.  Pertinent labs & imaging results that were available during my care of the patient were reviewed by me and considered in my medical decision making (see chart for details).       [place] is a 73 y.o. female here with ab pain, hip pain after fall. Also hit her head as well. CT head/neck and xrays ordered in triage. When I examined patient, she is still unable to bear weight on the right leg. Will get CT pelvis and CT lumbar spine to further assess.   9:34 PM CT showed some stenosis but no fractures. Pain improved and able to bear weight on the leg. Likely hip contusion. Stable  for discharge home.      Final Clinical Impressions(s) / ED Diagnoses   Final diagnoses:  Back pain    ED Discharge Orders    None       Charlynne PanderYao, Izel Eisenhardt Hsienta, MD 09/06/19 2135

## 2019-09-06 NOTE — ED Notes (Signed)
ptar called by dee  Unable to give a number on the list for pickup

## 2019-09-06 NOTE — ED Notes (Signed)
Unable to stand pt at beside. Pt is concerned about going home and being unable to ambulate. Dr Darl Householder notified

## 2019-09-07 ENCOUNTER — Emergency Department (HOSPITAL_COMMUNITY): Payer: Medicare Other

## 2019-09-07 ENCOUNTER — Encounter (HOSPITAL_COMMUNITY): Payer: Self-pay

## 2019-09-07 ENCOUNTER — Inpatient Hospital Stay (HOSPITAL_COMMUNITY)
Admission: EM | Admit: 2019-09-07 | Discharge: 2019-09-13 | DRG: 472 | Disposition: A | Discharge status: Rehabilitation Facility | Payer: Medicare Other | Attending: Neurosurgery | Admitting: Neurosurgery

## 2019-09-07 DIAGNOSIS — N319 Neuromuscular dysfunction of bladder, unspecified: Secondary | ICD-10-CM | POA: Diagnosis present

## 2019-09-07 DIAGNOSIS — Z91040 Latex allergy status: Secondary | ICD-10-CM

## 2019-09-07 DIAGNOSIS — Z79891 Long term (current) use of opiate analgesic: Secondary | ICD-10-CM

## 2019-09-07 DIAGNOSIS — M4802 Spinal stenosis, cervical region: Secondary | ICD-10-CM | POA: Diagnosis present

## 2019-09-07 DIAGNOSIS — G992 Myelopathy in diseases classified elsewhere: Secondary | ICD-10-CM | POA: Diagnosis present

## 2019-09-07 DIAGNOSIS — S0031XA Abrasion of nose, initial encounter: Secondary | ICD-10-CM | POA: Diagnosis present

## 2019-09-07 DIAGNOSIS — K5903 Drug induced constipation: Secondary | ICD-10-CM

## 2019-09-07 DIAGNOSIS — R339 Retention of urine, unspecified: Secondary | ICD-10-CM | POA: Diagnosis present

## 2019-09-07 DIAGNOSIS — Z20828 Contact with and (suspected) exposure to other viral communicable diseases: Secondary | ICD-10-CM | POA: Diagnosis present

## 2019-09-07 DIAGNOSIS — M50023 Cervical disc disorder at C6-C7 level with myelopathy: Secondary | ICD-10-CM | POA: Diagnosis present

## 2019-09-07 DIAGNOSIS — R001 Bradycardia, unspecified: Secondary | ICD-10-CM

## 2019-09-07 DIAGNOSIS — G9529 Other cord compression: Secondary | ICD-10-CM | POA: Diagnosis present

## 2019-09-07 DIAGNOSIS — M2578 Osteophyte, vertebrae: Secondary | ICD-10-CM | POA: Diagnosis present

## 2019-09-07 DIAGNOSIS — Z419 Encounter for procedure for purposes other than remedying health state, unspecified: Secondary | ICD-10-CM

## 2019-09-07 DIAGNOSIS — D62 Acute posthemorrhagic anemia: Secondary | ICD-10-CM | POA: Diagnosis not present

## 2019-09-07 DIAGNOSIS — W109XXA Fall (on) (from) unspecified stairs and steps, initial encounter: Secondary | ICD-10-CM | POA: Diagnosis present

## 2019-09-07 DIAGNOSIS — W19XXXA Unspecified fall, initial encounter: Secondary | ICD-10-CM

## 2019-09-07 DIAGNOSIS — Z7982 Long term (current) use of aspirin: Secondary | ICD-10-CM | POA: Diagnosis not present

## 2019-09-07 DIAGNOSIS — R1310 Dysphagia, unspecified: Secondary | ICD-10-CM | POA: Diagnosis present

## 2019-09-07 DIAGNOSIS — Z4889 Encounter for other specified surgical aftercare: Secondary | ICD-10-CM | POA: Diagnosis not present

## 2019-09-07 DIAGNOSIS — G8191 Hemiplegia, unspecified affecting right dominant side: Secondary | ICD-10-CM | POA: Diagnosis present

## 2019-09-07 DIAGNOSIS — G8918 Other acute postprocedural pain: Secondary | ICD-10-CM

## 2019-09-07 LAB — CBC
HCT: 42.8 % (ref 36.0–46.0)
Hemoglobin: 14 g/dL (ref 12.0–15.0)
MCH: 30.9 pg (ref 26.0–34.0)
MCHC: 32.7 g/dL (ref 30.0–36.0)
MCV: 94.5 fL (ref 80.0–100.0)
Platelets: 363 10*3/uL (ref 150–400)
RBC: 4.53 MIL/uL (ref 3.87–5.11)
RDW: 12.5 % (ref 11.5–15.5)
WBC: 11.4 10*3/uL — ABNORMAL HIGH (ref 4.0–10.5)
nRBC: 0 % (ref 0.0–0.2)

## 2019-09-07 LAB — BASIC METABOLIC PANEL
Anion gap: 11 (ref 5–15)
BUN: 15 mg/dL (ref 8–23)
CO2: 24 mmol/L (ref 22–32)
Calcium: 8.5 mg/dL — ABNORMAL LOW (ref 8.9–10.3)
Chloride: 105 mmol/L (ref 98–111)
Creatinine, Ser: 0.67 mg/dL (ref 0.44–1.00)
GFR calc Af Amer: >60 mL/min (ref >60)
GFR calc non Af Amer: >60 mL/min (ref >60)
Glucose, Bld: 107 mg/dL — ABNORMAL HIGH (ref 70–99)
Potassium: 3.7 mmol/L (ref 3.5–5.1)
Sodium: 140 mmol/L (ref 135–145)

## 2019-09-07 MED ORDER — ACETAMINOPHEN 325 MG PO TABS: 650.0000 mg | ORAL_TABLET | Freq: Four times a day (QID) | ORAL | Status: DC | PRN

## 2019-09-07 MED ORDER — FLEET ENEMA 7-19 GM/118ML RE ENEM: 1.0000 | ENEMA | Freq: Once | RECTAL | Status: DC | PRN

## 2019-09-07 MED ORDER — ONDANSETRON HCL 4 MG PO TABS: 4.0000 mg | ORAL_TABLET | Freq: Four times a day (QID) | ORAL | Status: DC | PRN

## 2019-09-07 MED ORDER — ONDANSETRON HCL 4 MG/2ML IJ SOLN: 4.0000 mg | Freq: Four times a day (QID) | INTRAMUSCULAR | Status: DC | PRN

## 2019-09-07 MED ORDER — ACETAMINOPHEN 650 MG RE SUPP: 650.0000 mg | Freq: Four times a day (QID) | RECTAL | Status: DC | PRN

## 2019-09-07 MED ORDER — OXYCODONE HCL 5 MG PO TABS
5.0000 mg | ORAL_TABLET | ORAL | Status: DC | PRN
  Administered 2019-09-08 – 2019-09-10 (×2): 5 mg via ORAL
  Filled 2019-09-07 (×2): qty 1

## 2019-09-07 MED ORDER — SODIUM CHLORIDE 0.9 % IV SOLN: 250.0000 mL | INTRAVENOUS | Status: DC | PRN

## 2019-09-07 MED ORDER — POLYETHYLENE GLYCOL 3350 17 G PO PACK: 17.0000 g | PACK | Freq: Every day | ORAL | Status: DC | PRN

## 2019-09-07 MED ORDER — SODIUM CHLORIDE 0.9% FLUSH
3.0000 mL | Freq: Two times a day (BID) | INTRAVENOUS | Status: DC
  Administered 2019-09-08 – 2019-09-11 (×2): 3 mL via INTRAVENOUS

## 2019-09-07 MED ORDER — ZOLPIDEM TARTRATE 5 MG PO TABS: 5.0000 mg | ORAL_TABLET | Freq: Every evening | ORAL | Status: DC | PRN

## 2019-09-07 MED ORDER — DOCUSATE SODIUM 100 MG PO CAPS
100.0000 mg | ORAL_CAPSULE | Freq: Two times a day (BID) | ORAL | Status: DC
  Administered 2019-09-08 – 2019-09-13 (×11): 100 mg via ORAL
  Filled 2019-09-07 (×11): qty 1

## 2019-09-07 MED ORDER — SODIUM CHLORIDE 0.9% FLUSH
3.0000 mL | Freq: Two times a day (BID) | INTRAVENOUS | Status: DC
  Administered 2019-09-08 – 2019-09-12 (×3): 3 mL via INTRAVENOUS

## 2019-09-07 MED ORDER — SODIUM CHLORIDE 0.9% FLUSH: 3.0000 mL | INTRAVENOUS | Status: DC | PRN

## 2019-09-07 MED ORDER — SODIUM CHLORIDE 0.9 % IV SOLN
INTRAVENOUS | Status: DC
  Administered 2019-09-07 – 2019-09-08 (×3): via INTRAVENOUS

## 2019-09-07 MED ORDER — LORAZEPAM 2 MG/ML IJ SOLN
1.0000 mg | Freq: Once | INTRAMUSCULAR | Status: DC | PRN
  Filled 2019-09-07: qty 1

## 2019-09-07 MED ORDER — BISACODYL 10 MG RE SUPP: 10.0000 mg | Freq: Every day | RECTAL | Status: DC | PRN

## 2019-09-07 NOTE — H&P (Signed)
Chief Complaint   No chief complaint on file.   HPI   Consult requested by: Dr Judd Lien, EDP at [facility] Reason for consult: cervical stenosis with myelopathy   HPI: [place] is a 73 y.o. female with no past medical history who presents today for worsening RUE weakness and RLE weakness. History begins yesterday after she had a mechanical fall - stubbed toe on outdoor wicker couch causing her to fall landing on face on concrete around 1100am. Initially after the fall, she felt as though she couldn't move her extremities, but this quickly resolved after several minutes. She did have difficulties getting up so she called her neighbor who helped her get into a chair, but ultimately called EMS. She underwent work up by EDP yesterday which included CT head/C spine/abd/pelvis which was negative for fracture. She was not noted to have any weakness and was able to bear weight and ambulate so was discharged home via EMS. Unfortunately, she was unable to get inside her home yesterday without what she reports "significant" assistance from EMS. They unfortunately also dropped her during transport. She was put in bed but unfortunately was unable to get up this am. She called her friend, who called EMS. On exam today, EDP noted RUE weakness. An MRI of cervical spine was obtained revealing severe cervical stenosis at C5-6 with cord signal changes. NSY consultation requested.   She complains of RUE weakness particularly with grip strength and difficulties with fine motor skills with RUE since yesterday evening. She is RHD. She also complains of RLE weaknesss which is causing gait instability. She denies neck pain, LUE or LLE symptoms. Did have numbness/tingling in right anterior forearm yesterday that has resolved. No other N/T. She denies bowel/bladder dysfunction.  Takes ASA as needed for HA, last dose approx 4 days ago. No other medications.  Patient Active Problem List   Diagnosis Date Noted   Stenosis of  cervical spine with myelopathy (HCC) 09/07/2019    PMH: History reviewed. No pertinent past medical history.  PSH: History reviewed. No pertinent surgical history.  (Not in a hospital admission)   SH: Social History   Tobacco Use   Smoking status: Never Smoker   Smokeless tobacco: Never Used  Substance Use Topics   Alcohol use: Not on file   Drug use: Not on file    MEDS: Prior to Admission medications   Medication Sig Start Date End Date Taking? Authorizing Provider  aspirin EC 325 MG tablet Take 650 mg by mouth daily as needed (pain/headache).   Yes [provider]  HYDROcodone-acetaminophen (NORCO/VICODIN) 5-325 MG tablet Take 1 tablet by mouth every 6 (six) hours as needed. 09/06/19  Yes Charlynne Pander, MD    ALLERGY: Allergies  Allergen Reactions   Latex Itching    Social History   Tobacco Use   Smoking status: Never Smoker   Smokeless tobacco: Never Used  Substance Use Topics   Alcohol use: Not on file     No family history on file.   ROS   Review of Systems  Constitutional: Negative.   HENT: Negative.   Eyes: Negative for blurred vision, double vision and photophobia.  Respiratory: Negative.   Cardiovascular: Negative.   Gastrointestinal: Negative for abdominal pain, nausea and vomiting.  Genitourinary: Negative.   Musculoskeletal: Positive for falls. Negative for back pain, joint pain, myalgias and neck pain.  Skin: Negative.   Neurological: Positive for weakness (RUE). Negative for dizziness, tingling, tremors, sensory change, speech change, focal weakness, seizures,  loss of consciousness and headaches.    Exam   Vitals:   09/07/19 1534 09/07/19 1834  BP: 131/60 (!) 144/65  Pulse: 63 66  Resp: 18 18  Temp:  98.5 F (36.9 C)  SpO2: 97% 96%   General appearance: WDWN, NAD, abrasion nasal bridge Eyes: No scleral injection Cardiovascular: Regular rate and rhythm without murmurs, rubs, gallops. No edema or  variciosities. Distal pulses normal. Pulmonary: Effort normal, non-labored breathing Musculoskeletal:     Muscle tone upper extremities: Normal    Muscle tone lower extremities: Normal    Motor exam: Upper Extremities Deltoid Bicep Tricep Grip  Right 4+/5 5/5 4/5 3/5, also unable to extend fingers, 4-/5 wrist extension  Left 5/5 5/5 5/5 5/5   Lower Extremity IP Quad PF DF EHL  Right 2/5 3/5 4/5 4/5 4/5  Left 5/5 5/5 5/5 5/5 5/5   Neurological Mental Status:    - Patient is awake, alert, oriented to person, place, month, year, and situation    - Patient is able to give a clear and coherent history.    - No signs of aphasia or neglect Cranial Nerves    - II: Visual Fields are full. PERRL    - III/IV/VI: EOMI without ptosis or diploplia.     - V: Facial sensation is grossly normal    - VII: Facial movement is symmetric.     - VIII: hearing is intact to voice    - X: Uvula elevates symmetrically    - XI: Shoulder shrug is symmetric.    - XII: tongue is midline without atrophy or fasciculations.  Sensory: Sensation grossly intact to LT Deep Tendon Reflexes    - 3+ in the biceps and 3+/4 patellar Plantars   - Toes are downgoing bilaterally.  Cerebellar    - FNF and HKS are intact bilaterally  Results - Imaging/Labs   Results for orders placed or performed during the hospital encounter of 09/07/19 (from the past 48 hour(s))  CBC     Status: Abnormal   Collection Time: 09/07/19  2:55 PM  Result Value Ref Range   WBC 11.4 (H) 4.0 - 10.5 K/uL   RBC 4.53 3.87 - 5.11 MIL/uL   Hemoglobin 14.0 12.0 - 15.0 g/dL   HCT 16.1 09.6 - 04.5 %   MCV 94.5 80.0 - 100.0 fL   MCH 30.9 26.0 - 34.0 pg   MCHC 32.7 30.0 - 36.0 g/dL   RDW 40.9 81.1 - 91.4 %   Platelets 363 150 - 400 K/uL   nRBC 0.0 0.0 - 0.2 %    Comment: Performed at [facility], [address] [place], [place] [address]  Basic metabolic panel     Status: Abnormal   Collection Time: 09/07/19  2:55 PM    Result Value Ref Range   Sodium 140 135 - 145 mmol/L   Potassium 3.7 3.5 - 5.1 mmol/L   Chloride 105 98 - 111 mmol/L   CO2 24 22 - 32 mmol/L   Glucose, Bld 107 (H) 70 - 99 mg/dL   BUN 15 8 - 23 mg/dL   Creatinine, Ser 6.21 0.44 - 1.00 mg/dL   Calcium 8.5 (L) 8.9 - 10.3 mg/dL   GFR calc non Af Amer >60 >60 mL/min   GFR calc Af Amer >60 >60 mL/min   Anion gap 11 5 - 15    Comment: Performed at [facility], [address] [place], [place] [address]    Ct  Head Wo Contrast  Result Date: 09/06/2019 CLINICAL DATA:  Mechanical fall no loss of consciousness or anticoagulation. EXAM: CT HEAD WITHOUT CONTRAST CT CERVICAL SPINE WITHOUT CONTRAST TECHNIQUE: Multidetector CT imaging of the head and cervical spine was performed following the standard protocol without intravenous contrast. Multiplanar CT image reconstructions of the cervical spine were also generated. COMPARISON:  None. FINDINGS: CT HEAD FINDINGS Brain: No evidence of acute infarction, hemorrhage, hydrocephalus, extra-axial collection or mass lesion/mass effect. Symmetric prominence of the ventricles, cisterns and sulci compatible with parenchymal volume loss. Patchy areas of white matter hypoattenuation are most compatible with chronic microvascular angiopathy. Vascular: Atherosclerotic calcification of the carotid siphons. No hyperdense vessel. Skull: Midline frontal scalp swelling/contusion with small hematoma measuring 4 mm in maximal thickness. Sinuses/Orbits: Minimal mural thickening in the paranasal sinuses. Mastoid air cells are clear. Included orbital structures are unremarkable. Other: None CT CERVICAL SPINE FINDINGS Alignment: Straightening and slight reversal of the normal cervical lordosis centered at C5-6. No abnormal facet widening. Normal alignment of the craniocervical and atlantoaxial articulations. Skull base and vertebrae: No acute fracture. No primary bone lesion or focal pathologic process. Soft tissues  and spinal canal: No pre or paravertebral fluid or swelling. No visible canal hematoma. Mild pannus formation posterior to the dens. Disc levels: Multilevel intervertebral disc height loss with spondylitic endplate changes. Diffuse facet hypertrophic changes and uncinate spurring are present as well. Features maximal at C5-6 and C6-7 with at least mild-to-moderate canal stenoses and moderate to severe bilateral neural foraminal narrowing at these levels. Upper chest: Atelectatic and hypoventilatory changes noted in the upper chest. No acute worse some abnormality. Aortic arch calcifications are noted. Other: Normal thyroid gland. IMPRESSION: 1. No acute intracranial abnormality. 2. Minimal parenchymal volume loss and chronic white matter angiopathy changes. 3. Midline frontal scalp swelling/contusion with small hematoma measuring 4 mm in maximal thickness. No calvarial fracture. 4. No acute cervical spine fracture or traumatic listhesis. 5. Multilevel degenerative changes maximal at C5-6 and C6-7 with at least mild-to-moderate canal stenoses and moderate to severe bilateral neural foraminal narrowing at these levels. 6.  Aortic Atherosclerosis (ICD10-I70.0). Electronically Signed   By: Lovena Le M.D.   On: 09/06/2019 14:06   Ct Cervical Spine Wo Contrast  Result Date: 09/06/2019 CLINICAL DATA:  Mechanical fall no loss of consciousness or anticoagulation. EXAM: CT HEAD WITHOUT CONTRAST CT CERVICAL SPINE WITHOUT CONTRAST TECHNIQUE: Multidetector CT imaging of the head and cervical spine was performed following the standard protocol without intravenous contrast. Multiplanar CT image reconstructions of the cervical spine were also generated. COMPARISON:  None. FINDINGS: CT HEAD FINDINGS Brain: No evidence of acute infarction, hemorrhage, hydrocephalus, extra-axial collection or mass lesion/mass effect. Symmetric prominence of the ventricles, cisterns and sulci compatible with parenchymal volume loss. Patchy  areas of white matter hypoattenuation are most compatible with chronic microvascular angiopathy. Vascular: Atherosclerotic calcification of the carotid siphons. No hyperdense vessel. Skull: Midline frontal scalp swelling/contusion with small hematoma measuring 4 mm in maximal thickness. Sinuses/Orbits: Minimal mural thickening in the paranasal sinuses. Mastoid air cells are clear. Included orbital structures are unremarkable. Other: None CT CERVICAL SPINE FINDINGS Alignment: Straightening and slight reversal of the normal cervical lordosis centered at C5-6. No abnormal facet widening. Normal alignment of the craniocervical and atlantoaxial articulations. Skull base and vertebrae: No acute fracture. No primary bone lesion or focal pathologic process. Soft tissues and spinal canal: No pre or paravertebral fluid or swelling. No visible canal hematoma. Mild pannus formation posterior to the dens. Disc levels: Multilevel intervertebral  disc height loss with spondylitic endplate changes. Diffuse facet hypertrophic changes and uncinate spurring are present as well. Features maximal at C5-6 and C6-7 with at least mild-to-moderate canal stenoses and moderate to severe bilateral neural foraminal narrowing at these levels. Upper chest: Atelectatic and hypoventilatory changes noted in the upper chest. No acute worse some abnormality. Aortic arch calcifications are noted. Other: Normal thyroid gland. IMPRESSION: 1. No acute intracranial abnormality. 2. Minimal parenchymal volume loss and chronic white matter angiopathy changes. 3. Midline frontal scalp swelling/contusion with small hematoma measuring 4 mm in maximal thickness. No calvarial fracture. 4. No acute cervical spine fracture or traumatic listhesis. 5. Multilevel degenerative changes maximal at C5-6 and C6-7 with at least mild-to-moderate canal stenoses and moderate to severe bilateral neural foraminal narrowing at these levels. 6.  Aortic Atherosclerosis (ICD10-I70.0).  Electronically Signed   By: Kreg ShropshirePrice  DeHay M.D.   On: 09/06/2019 14:06   Mr Cervical Spine Wo Contrast  Result Date: 09/07/2019 CLINICAL DATA:  Fall down stairs, reduced strength in the legs and right arm. EXAM: MRI CERVICAL SPINE WITHOUT CONTRAST TECHNIQUE: Multiplanar, multisequence MR imaging of the cervical spine was performed. No intravenous contrast was administered. COMPARISON:  Cervical spine CT 09/06/2019 FINDINGS: Alignment: 2 mm degenerative retrolisthesis at C5-6 and C6-7. Vertebrae: Disc desiccation throughout cervical spine with loss of disc height most severe at C5-6 and C6-7. Type 1 degenerative endplate findings at C5-6 and C6-7. Mild degenerative facet edema on the left at C4-5 and C5-6. Cord: There is abnormal accentuated T2 signal in the cord extending from the C4-5 level through the mid C7 level, about 3.2 cm in length. This abnormal signal is indistinctly marginated and probably associated with the severe impingement at C5-6 and C6-7. The rest of the cord appears unremarkable. Posterior Fossa, vertebral arteries, paraspinal tissues: Lack of flow signal in the left vertebral artery compatible with either slow flow or lack of flow, chronicity unknown. Disc levels: C2-3: Mild right foraminal stenosis due to facet spurring. C3-4: Mild bilateral foraminal stenosis due to disc bulge, uncinate spurring, and facet spurring. C4-5: Moderate right and mild left foraminal stenosis due to disc bulge, uncinate spurring, and facet spurring. C5-6: Severe central narrowing of the thecal sac with severe bilateral foraminal stenosis due to central disc protrusion, posterior osseous ridging, uncinate spurring, and facet arthropathy. The AP diameter of the thecal sac is narrowed to about 0.4 cm. C6-7: Prominent bilateral foraminal stenosis and moderate central narrowing of the thecal sac due to disc osteophyte complex, uncinate spurring, and facet spurring. C7-T1: Unremarkable. IMPRESSION: 1. Abnormal cord edema  or myelomalacia extending from C4-5 through the mid C7 level, probably associated with the severe central narrowing of the thecal sac at C5-6 and the moderate central narrowing of the thecal sac at C6-7. The chronicity of this abnormal cord signal is uncertain but an acute cord injury is not excluded. 2. Overall cervical spondylosis and degenerative disc disease causes severe impingement at C5-6 and C6-7; moderate impingement at C4-5; and mild impingement at C2-3 and C3-4, as detailed above. 3. Lack of flow signal in the left vertebral artery compatible with either slow flow or lack of flow, chronicity unknown. Electronically Signed   By: Gaylyn RongWalter  Liebkemann M.D.   On: 09/07/2019 18:10   Ct Abdomen Pelvis W Contrast  Result Date: 09/06/2019 CLINICAL DATA:  Abdominal trauma EXAM: CT ABDOMEN AND PELVIS WITH CONTRAST TECHNIQUE: Multidetector CT imaging of the abdomen and pelvis was performed using the standard protocol following bolus administration of intravenous  contrast. CONTRAST:  OMNIPAQUE IOHEXOL 300 MG/ML  SOLN COMPARISON:  01/13/2009 FINDINGS: Lower chest: Lung bases are clear. No effusions. Heart is normal size. Hepatobiliary: Prior cholecystectomy. Intrahepatic and extrahepatic biliary ductal dilatation. Common bile duct measures up to 13 mm. This may be related to patient's age and post cholecystectomy state. No focal hepatic abnormality. Pancreas: No focal abnormality or ductal dilatation. Spleen: No focal abnormality.  Normal size. Adrenals/Urinary Tract: No adrenal abnormality. No focal renal abnormality. No stones or hydronephrosis. Urinary bladder is unremarkable. Stomach/Bowel: Extensive left colonic diverticulosis. No active diverticulitis. Normal appendix. Stomach and small bowel decompressed, unremarkable. Vascular/Lymphatic: Aortic atherosclerosis. No enlarged abdominal or pelvic lymph nodes. Reproductive: Uterus and adnexa unremarkable.  No mass. Other: No free fluid or free air.  Musculoskeletal: Degenerative changes in the lumbar spine. No acute bony abnormality. IMPRESSION: No evidence of solid organ injury. Biliary ductal dilatation which may be related to post cholecystectomy state. Recommend correlation with LFTs. Extensive left colonic diverticulosis.  No active diverticulitis. Aortic atherosclerosis. No acute findings. Electronically Signed   By: Charlett Nose M.D.   On: 09/06/2019 20:55   Mr Hip Right Wo Contrast  Result Date: 09/07/2019 CLINICAL DATA:  Hip pain. Fall yesterday. EXAM: MR OF THE RIGHT HIP WITHOUT CONTRAST TECHNIQUE: Multiplanar, multisequence MR imaging was performed. No intravenous contrast was administered. COMPARISON:  CT pelvis 09/06/2019 FINDINGS: Bones: Mild spurring of both hips. No visible fracture. Lumbar spondylosis and degenerative disc disease. Mild degenerative subcortical cyst formation along both acetabular rims. Articular cartilage and labrum Articular cartilage:  Mild degenerative chondral thinning. Labrum:  Grossly unremarkable Joint or bursal effusion Joint effusion:  Absent Bursae: No regional bursitis. Muscles and tendons Muscles and tendons:  Unremarkable Other findings Miscellaneous:   Sigmoid colon diverticulosis. IMPRESSION: 1. No acute findings in the right hip or regional bony pelvis. 2. Lumbar spondylosis and degenerative disc disease. 3. Mild degenerative findings in both hips. 4. Sigmoid colon diverticulosis. Electronically Signed   By: Gaylyn Rong M.D.   On: 09/07/2019 18:02   Ct L-spine No Charge  Result Date: 09/06/2019 CLINICAL DATA:  Acute presentation with back pain. EXAM: CT LUMBAR SPINE WITHOUT CONTRAST TECHNIQUE: Multidetector CT imaging of the lumbar spine was performed without intravenous contrast administration. Multiplanar CT image reconstructions were also generated. COMPARISON:  None. FINDINGS: Segmentation: 5 lumbar type vertebral bodies. Alignment: Curvature convex to the left with the apex at L2-3.  Vertebrae: No fracture or primary bone lesion. Chronic discogenic endplate changes. Paraspinal and other soft tissues: See results of CT abdomen pelvis. Disc levels: T12-L1 and L1-2: Negative. L2-3: Chronic disc degeneration with vacuum phenomenon. Endplate osteophytes and bulging of the disc. No central canal stenosis. Lateral recess and foraminal narrowing on the right. L3-4: Disc degeneration with vacuum phenomenon. Prominent right-sided osteophytes. Bulging of the disc. Narrowing of the right lateral recess and intervertebral foramen that could cause right-sided pain. L4-5: Disc degeneration with vacuum phenomenon. Endplate osteophytes and bulging of the disc. Narrowing of the lateral recesses and both foramina. Neural compression could occur on either side at this level. L5-S1: Disc degeneration with vacuum phenomenon. Mild bulging of the disc. Endplate osteophytes worse on the left with lateral recess and foraminal stenosis that could cause left-sided neural compression. IMPRESSION: No acute lumbar region finding. Chronic scoliotic curvature convex to the left the apex at L2-3. Right-sided predominant degenerative changes at L2-3 and L3-4 that could cause right-sided neural compression. Degenerative disease at L4-5 with narrowing of both lateral recesses and neural foramina that  could cause neural compression on either or both sides. Left-sided predominant degenerative changes at L5-S1 with lateral recess and foraminal stenosis that could cause left-sided radicular pain. Certainly, the findings in general could be associated with back pain. Electronically Signed   By: Paulina Fusi M.D.   On: 09/06/2019 20:56   Dg Hand Complete Right  Result Date: 09/06/2019 CLINICAL DATA:  73 year old female with fall and trauma to the right hand. EXAM: RIGHT HAND - COMPLETE 3+ VIEW COMPARISON:  None. FINDINGS: There is no acute fracture or dislocation. The bones are osteopenic. Mild narrowing of the DIP joint spaces. The  soft tissues are unremarkable. IMPRESSION: No acute fracture or dislocation. Electronically Signed   By: Elgie Collard M.D.   On: 09/06/2019 13:44   Dg Hip Unilat With Pelvis 2-3 Views Right  Result Date: 09/06/2019 CLINICAL DATA:  Status post fall, groin pain EXAM: DG HIP (WITH OR WITHOUT PELVIS) 2-3V RIGHT COMPARISON:  None. FINDINGS: Generalized osteopenia. No acute fracture or dislocation. Mild 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 IMPRESSION: No acute osseous injury of the right hip. Given the patient's age and osteopenia, if there is persistent clinical concern for an occult hip fracture, a MRI of the hip is recommended for increased sensitivity. Electronically Signed   By: Elige Ko   On: 09/06/2019 13:43    Impression/Plan   73 y.o. female with severe cervical spinal stenosis with cord signal changes at C5-6 and to a lesser extent C6-7. Neuro deficits as above most notable for RUE and RLE weakness. Given degree of stenosis, se will need to undergo C5-6, C6-7 ACDF. Will admit and plan for surgery this upcoming Thursday with Dr Conchita Paris.  I had a long discussion with patient regarding her MRI findings, recommendations for surgery, surgery expectations, prognosis, risks, benefits etc. She states understanding and wishes to proceed.  Cindra Presume, PA-C [facility]

## 2019-09-07 NOTE — Progress Notes (Signed)
Per [place] MUST pt now has a PASRR:  [id number] With start date of: 09/07/19 and no end date.  CSW will continue to follow for D/C needs.  Alphonse Guild. Latamara Melder, LCSW, LCAS, CSI Transitions of Care Clinical Social Worker Care Coordination Department Ph: [phone]

## 2019-09-07 NOTE — ED Provider Notes (Signed)
Care assumed from Dr. Tomi Bamberger at shift change.  Patient seen last night at [facility] after a fall.  She had CT scans performed which were unremarkable.  Since returning home, she is now having weakness in her right hand and ongoing pain in her right hip.  Dr. Tomi Bamberger has ordered an MRI of the cervical spine and right hip and care was signed out to me in anticipation of the results of the studies.  Studies have returned showing no fracture of the hip, but do show abnormal signal flair in the cervical spine.  She has significant spinal stenosis.  This finding was discussed with neurosurgery who was evaluated and will admit the patient.  She will likely require surgical intervention/decompression.   Veryl Speak, MD 09/07/19 [phone]

## 2019-09-07 NOTE — Progress Notes (Signed)
CSW spoke to [facility] RN CM who states pt provided verbal  Permission for [facility] CSW/CM Dept to create an FL-2  and confirmed pt's plan to be discharged to SNF to for rehab at discharge to regain ability to perform ASL's and amulate safely as pt lives alone.  Per TOC RN CM, RN CM provided active listening and validated pt's concerns.   CSW will complete FL-2 and send referrals out to SNF facilities via the hub per pt's request.  Pt has been living independently, but alone prior to being admitted to [facility].  CSW will continue to follow for D/C needs.  Alphonse Guild. Emigdio Wildeman, LCSW, LCAS, CSI Transitions of Care Clinical Social Worker Care Coordination Department Ph: [phone]

## 2019-09-07 NOTE — ED Triage Notes (Addendum)
Patient arrived via [facility] from.  Ground level fall outside yesterday on concrete patio. Fell face first. Has scrape on her nose per ems.  Seen as [place] yesterday and was told no fractures just bruising.   Patient c/o weakness and soreness  Normally able to get around the house without walker or cane. Today patient can not get out of bed.   A friend called EMS wanting patient to possibly go to rehab.   Patient lives alone.   151/54 94% RA P-76 RR-18 98.2 orally   No significant hx per ems

## 2019-09-07 NOTE — Progress Notes (Signed)
PT Cancellation Note  Patient Details Name: Sandra Parrish MRN: [id number] DOB: 12-22-1945   Cancelled Treatment:    Reason Eval/Treat Not Completed: Medical issues which prohibited therapy  PT orders received.  Saw that imminent discharge order; however, pt with MRI c-spine pending due to new R grip weakness and MRI of hip pending to evaluate inability to bear weight (CT was negative yesterday).  Will await these results for safe PT evaluation.     Karlton Lemon 09/07/2019, 4:43 PM

## 2019-09-07 NOTE — TOC Initial Note (Addendum)
Transition of Care [facility]) - Initial/Assessment Note    Patient Details  Name: Sandra Parrish MRN: [id number] Date of Birth: 1946/05/06  Transition of Care [facility]) CM/SW Contact:    Erenest Rasher, RN Phone Number:[phone] 09/07/2019, 5:17 PM  Clinical Narrative:                 Spoke to pt a bedside. Pt states she lives alone. Gave permission to speak to speak to [facility], friend. States she does have Medicare Part A. States she does want to go to SNF rehab. Will need PT recommendations. CSW will follow up on SNF rehab placement. Pt states no DME at home. Reports she was independent at home prior to falls. Pt gave permission to create FL2 and fax referral to SNF for placement.   Expected Discharge Plan: Skilled Nursing Facility Barriers to Discharge: Continued Medical Work up   Patient Goals and CMS Choice Patient states their goals for this hospitalization and ongoing recovery are:: wants rehab CMS Medicare.gov Compare Post Acute Care list provided to:: Patient Choice offered to / list presented to : Patient  Expected Discharge Plan and Services Expected Discharge Plan: [place] In-house Referral: Clinical Social Work Discharge Planning Services: CM Consult Post Acute Care Choice: [place] Living arrangements for the past 2 months: Apartment                                      Prior Living Arrangements/Services Living arrangements for the past 2 months: Apartment Lives with:: Self Patient language and need for interpreter reviewed:: Yes Do you feel safe going back to the place where you live?: No   lives alone  Need for Family Participation in Patient Care: Yes (Comment) Care giver support system in place?: Yes (comment)   Criminal Activity/Legal Involvement Pertinent to Current Situation/Hospitalization: No - Comment as needed  Activities of Daily Living      Permission Sought/Granted Permission sought to share  information with : Case Manager, PCP, Family Supports Permission granted to share information with : Yes, Verbal Permission Granted  Share Information with NAME: Amy Rizalla  Permission granted to share info w AGENCY: SNF  Permission granted to share info w Relationship: friend  Permission granted to share info w Contact Information: [phone]  Emotional Assessment Appearance:: Appears younger than stated age Attitude/Demeanor/Rapport: Gracious Affect (typically observed): Accepting Orientation: : Oriented to Self, Oriented to Place, Oriented to  Time, Oriented to Situation   Psych Involvement: No (comment)  Admission diagnosis:  Fall There are no active problems to display for this patient.  PCP:  Patient, No Pcp Per Pharmacy:   [facility] [place], [place] - [address] [place] [address] Phone: [phone] Fax: [phone]     Social Determinants of Health ([place]) Interventions    Readmission Risk Interventions No flowsheet data found.

## 2019-09-07 NOTE — Progress Notes (Signed)
Consult request has been received. CSW attempting to follow up at present time.  Although chart previously stated pt had no insurance CSW spoke to registration who confirmed pt has Medicare A (but NOT B).  Per PT a PT assessment was pending a MRI completion.  CSW will continue to follow for D/C needs.  Alphonse Guild. Panagiotis Oelkers, LCSW, LCAS, CSI Transitions of Care Clinical Social Worker Care Coordination Department Ph: [phone]

## 2019-09-07 NOTE — ED Provider Notes (Signed)
[facility]-EMERGENCY DEPT Provider Note   CSN: [id number] Arrival date & time: 09/07/19  1306     History   Chief Complaint No chief complaint on file.   HPI Sandra Parrish is a 73 y.o. female.     HPI Patient presents to the emergency room for persistent pain and now weakness after recent fall.  Patient was at home yesterday when she was cleaning up some leaves on her porch.  She tripped and fell landing on the ground hitting her face right hand and right hip.  Patient was unable to get up and bear any weight after head injury.  Patient went to [facility] emergency room.  While there she had x-rays of her hand hip head C-spine abdomen pelvis and L-spine.  Patient CT scans demonstrated no acute fractures but she did have multilevel degenerative changes With mild to moderate canal stenosis and moderate to severe bilateral neural foraminal stenosis.  Patient's lumbar spine films also showed the possibility of degenerative changes at multiple levels that could cause foraminal stenosis and neural compression.  Patient was able to ambulate somewhat last night but this morning however she is finding it very difficult to move.  She is hurting in both sides her hip as well as her wrist.  Patient was not able to get out of bed.  She has been taking Vicodin that was prescribed for her.  Patient also finds that her right hand is weak.  She is also having difficulty lifting her right leg.  History reviewed. No pertinent past medical history.   There are no active problems to display for this patient.   History reviewed. No pertinent surgical history.   OB History   No obstetric history on file.      Home Medications    Prior to Admission medications   Medication Sig Start Date End Date Taking? Authorizing Provider  aspirin EC 325 MG tablet Take 650 mg by mouth daily as needed (pain/headache).   Yes [provider]  HYDROcodone-acetaminophen (NORCO/VICODIN)  5-325 MG tablet Take 1 tablet by mouth every 6 (six) hours as needed. 09/06/19  Yes Charlynne Pander, MD    Family History No family history on file.  Social History Social History   Tobacco Use   Smoking status: Never Smoker   Smokeless tobacco: Never Used  Substance Use Topics   Alcohol use: Not on file   Drug use: Not on file     Allergies   Latex   Review of Systems Review of Systems  All other systems reviewed and are negative.    Physical Exam Updated Vital Signs BP 131/60 (BP Location: Right Arm)    Pulse 63    Temp 98 F (36.7 C) (Oral)    Resp 18    SpO2 97%   Physical Exam Vitals signs and nursing note reviewed.  Constitutional:      General: She is not in acute distress.    Appearance: She is well-developed.  HENT:     Head: Normocephalic.     Comments: Abrasion nose    Right Ear: External ear normal.     Left Ear: External ear normal.  Eyes:     General: No scleral icterus.       Right eye: No discharge.        Left eye: No discharge.     Conjunctiva/sclera: Conjunctivae normal.  Neck:     Musculoskeletal: Neck supple.     Trachea: No tracheal deviation.  Cardiovascular:     Rate and Rhythm: Normal rate and regular rhythm.  Pulmonary:     Effort: Pulmonary effort is normal. No respiratory distress.     Breath sounds: Normal breath sounds. No stridor. No wheezing or rales.  Abdominal:     General: Bowel sounds are normal. There is no distension.     Palpations: Abdomen is soft.     Tenderness: There is no abdominal tenderness. There is no guarding or rebound.  Musculoskeletal:     Right wrist: She exhibits tenderness.     Right hip: She exhibits tenderness.     Comments: No swelling or deformity  Skin:    General: Skin is warm and dry.     Findings: No rash.  Neurological:     Mental Status: She is alert.     Cranial Nerves: No cranial nerve deficit (no facial droop, extraocular movements intact, no slurred speech).     Sensory:  No sensory deficit.     Motor: Weakness present. No abnormal muscle tone or seizure activity.     Coordination: Coordination normal.     Comments: Weak grip strength right hand, weakness vs pain  Right leg, unable to lift right leg off the bed,nl sensation      ED Treatments / Results  Labs (all labs ordered are listed, but only abnormal results are displayed) Labs Reviewed  CBC - Abnormal; Notable for the following components:      Result Value   WBC 11.4 (*)    All other components within normal limits  BASIC METABOLIC PANEL - Abnormal; Notable for the following components:   Glucose, Bld 107 (*)    Calcium 8.5 (*)    All other components within normal limits    EKG None  Radiology Ct Head Wo Contrast  Result Date: 09/06/2019 CLINICAL DATA:  Mechanical fall no loss of consciousness or anticoagulation. EXAM: CT HEAD WITHOUT CONTRAST CT CERVICAL SPINE WITHOUT CONTRAST TECHNIQUE: Multidetector CT imaging of the head and cervical spine was performed following the standard protocol without intravenous contrast. Multiplanar CT image reconstructions of the cervical spine were also generated. COMPARISON:  None. FINDINGS: CT HEAD FINDINGS Brain: No evidence of acute infarction, hemorrhage, hydrocephalus, extra-axial collection or mass lesion/mass effect. Symmetric prominence of the ventricles, cisterns and sulci compatible with parenchymal volume loss. Patchy areas of white matter hypoattenuation are most compatible with chronic microvascular angiopathy. Vascular: Atherosclerotic calcification of the carotid siphons. No hyperdense vessel. Skull: Midline frontal scalp swelling/contusion with small hematoma measuring 4 mm in maximal thickness. Sinuses/Orbits: Minimal mural thickening in the paranasal sinuses. Mastoid air cells are clear. Included orbital structures are unremarkable. Other: None CT CERVICAL SPINE FINDINGS Alignment: Straightening and slight reversal of the normal cervical lordosis  centered at C5-6. No abnormal facet widening. Normal alignment of the craniocervical and atlantoaxial articulations. Skull base and vertebrae: No acute fracture. No primary bone lesion or focal pathologic process. Soft tissues and spinal canal: No pre or paravertebral fluid or swelling. No visible canal hematoma. Mild pannus formation posterior to the dens. Disc levels: Multilevel intervertebral disc height loss with spondylitic endplate changes. Diffuse facet hypertrophic changes and uncinate spurring are present as well. Features maximal at C5-6 and C6-7 with at least mild-to-moderate canal stenoses and moderate to severe bilateral neural foraminal narrowing at these levels. Upper chest: Atelectatic and hypoventilatory changes noted in the upper chest. No acute worse some abnormality. Aortic arch calcifications are noted. Other: Normal thyroid gland. IMPRESSION: 1. No acute intracranial abnormality.  2. Minimal parenchymal volume loss and chronic white matter angiopathy changes. 3. Midline frontal scalp swelling/contusion with small hematoma measuring 4 mm in maximal thickness. No calvarial fracture. 4. No acute cervical spine fracture or traumatic listhesis. 5. Multilevel degenerative changes maximal at C5-6 and C6-7 with at least mild-to-moderate canal stenoses and moderate to severe bilateral neural foraminal narrowing at these levels. 6.  Aortic Atherosclerosis (ICD10-I70.0). Electronically Signed   By: Lovena Le M.D.   On: 09/06/2019 14:06   Ct Cervical Spine Wo Contrast  Result Date: 09/06/2019 CLINICAL DATA:  Mechanical fall no loss of consciousness or anticoagulation. EXAM: CT HEAD WITHOUT CONTRAST CT CERVICAL SPINE WITHOUT CONTRAST TECHNIQUE: Multidetector CT imaging of the head and cervical spine was performed following the standard protocol without intravenous contrast. Multiplanar CT image reconstructions of the cervical spine were also generated. COMPARISON:  None. FINDINGS: CT HEAD FINDINGS  Brain: No evidence of acute infarction, hemorrhage, hydrocephalus, extra-axial collection or mass lesion/mass effect. Symmetric prominence of the ventricles, cisterns and sulci compatible with parenchymal volume loss. Patchy areas of white matter hypoattenuation are most compatible with chronic microvascular angiopathy. Vascular: Atherosclerotic calcification of the carotid siphons. No hyperdense vessel. Skull: Midline frontal scalp swelling/contusion with small hematoma measuring 4 mm in maximal thickness. Sinuses/Orbits: Minimal mural thickening in the paranasal sinuses. Mastoid air cells are clear. Included orbital structures are unremarkable. Other: None CT CERVICAL SPINE FINDINGS Alignment: Straightening and slight reversal of the normal cervical lordosis centered at C5-6. No abnormal facet widening. Normal alignment of the craniocervical and atlantoaxial articulations. Skull base and vertebrae: No acute fracture. No primary bone lesion or focal pathologic process. Soft tissues and spinal canal: No pre or paravertebral fluid or swelling. No visible canal hematoma. Mild pannus formation posterior to the dens. Disc levels: Multilevel intervertebral disc height loss with spondylitic endplate changes. Diffuse facet hypertrophic changes and uncinate spurring are present as well. Features maximal at C5-6 and C6-7 with at least mild-to-moderate canal stenoses and moderate to severe bilateral neural foraminal narrowing at these levels. Upper chest: Atelectatic and hypoventilatory changes noted in the upper chest. No acute worse some abnormality. Aortic arch calcifications are noted. Other: Normal thyroid gland. IMPRESSION: 1. No acute intracranial abnormality. 2. Minimal parenchymal volume loss and chronic white matter angiopathy changes. 3. Midline frontal scalp swelling/contusion with small hematoma measuring 4 mm in maximal thickness. No calvarial fracture. 4. No acute cervical spine fracture or traumatic listhesis.  5. Multilevel degenerative changes maximal at C5-6 and C6-7 with at least mild-to-moderate canal stenoses and moderate to severe bilateral neural foraminal narrowing at these levels. 6.  Aortic Atherosclerosis (ICD10-I70.0). Electronically Signed   By: Lovena Le M.D.   On: 09/06/2019 14:06   Ct Abdomen Pelvis W Contrast  Result Date: 09/06/2019 CLINICAL DATA:  Abdominal trauma EXAM: CT ABDOMEN AND PELVIS WITH CONTRAST TECHNIQUE: Multidetector CT imaging of the abdomen and pelvis was performed using the standard protocol following bolus administration of intravenous contrast. CONTRAST:  127mL OMNIPAQUE IOHEXOL 300 MG/ML  SOLN COMPARISON:  01/13/2009 FINDINGS: Lower chest: Lung bases are clear. No effusions. Heart is normal size. Hepatobiliary: Prior cholecystectomy. Intrahepatic and extrahepatic biliary ductal dilatation. Common bile duct measures up to 13 mm. This may be related to patient's age and post cholecystectomy state. No focal hepatic abnormality. Pancreas: No focal abnormality or ductal dilatation. Spleen: No focal abnormality.  Normal size. Adrenals/Urinary Tract: No adrenal abnormality. No focal renal abnormality. No stones or hydronephrosis. Urinary bladder is unremarkable. Stomach/Bowel: Extensive left colonic diverticulosis. No  active diverticulitis. Normal appendix. Stomach and small bowel decompressed, unremarkable. Vascular/Lymphatic: Aortic atherosclerosis. No enlarged abdominal or pelvic lymph nodes. Reproductive: Uterus and adnexa unremarkable.  No mass. Other: No free fluid or free air. Musculoskeletal: Degenerative changes in the lumbar spine. No acute bony abnormality. IMPRESSION: No evidence of solid organ injury. Biliary ductal dilatation which may be related to post cholecystectomy state. Recommend correlation with LFTs. Extensive left colonic diverticulosis.  No active diverticulitis. Aortic atherosclerosis. No acute findings. Electronically Signed   By: Charlett NoseKevin  Dover M.D.   On:  09/06/2019 20:55   Ct L-spine No Charge  Result Date: 09/06/2019 CLINICAL DATA:  Acute presentation with back pain. EXAM: CT LUMBAR SPINE WITHOUT CONTRAST TECHNIQUE: Multidetector CT imaging of the lumbar spine was performed without intravenous contrast administration. Multiplanar CT image reconstructions were also generated. COMPARISON:  None. FINDINGS: Segmentation: 5 lumbar type vertebral bodies. Alignment: Curvature convex to the left with the apex at L2-3. Vertebrae: No fracture or primary bone lesion. Chronic discogenic endplate changes. Paraspinal and other soft tissues: See results of CT abdomen pelvis. Disc levels: T12-L1 and L1-2: Negative. L2-3: Chronic disc degeneration with vacuum phenomenon. Endplate osteophytes and bulging of the disc. No central canal stenosis. Lateral recess and foraminal narrowing on the right. L3-4: Disc degeneration with vacuum phenomenon. Prominent right-sided osteophytes. Bulging of the disc. Narrowing of the right lateral recess and intervertebral foramen that could cause right-sided pain. L4-5: Disc degeneration with vacuum phenomenon. Endplate osteophytes and bulging of the disc. Narrowing of the lateral recesses and both foramina. Neural compression could occur on either side at this level. L5-S1: Disc degeneration with vacuum phenomenon. Mild bulging of the disc. Endplate osteophytes worse on the left with lateral recess and foraminal stenosis that could cause left-sided neural compression. IMPRESSION: No acute lumbar region finding. Chronic scoliotic curvature convex to the left the apex at L2-3. Right-sided predominant degenerative changes at L2-3 and L3-4 that could cause right-sided neural compression. Degenerative disease at L4-5 with narrowing of both lateral recesses and neural foramina that could cause neural compression on either or both sides. Left-sided predominant degenerative changes at L5-S1 with lateral recess and foraminal stenosis that could cause  left-sided radicular pain. Certainly, the findings in general could be associated with back pain. Electronically Signed   By: Paulina FusiMark  Shogry M.D.   On: 09/06/2019 20:56   Dg Hand Complete Right  Result Date: 09/06/2019 CLINICAL DATA:  73 year old female with fall and trauma to the right hand. EXAM: RIGHT HAND - COMPLETE 3+ VIEW COMPARISON:  None. FINDINGS: There is no acute fracture or dislocation. The bones are osteopenic. Mild narrowing of the DIP joint spaces. The soft tissues are unremarkable. IMPRESSION: No acute fracture or dislocation. Electronically Signed   By: Elgie CollardArash  Radparvar M.D.   On: 09/06/2019 13:44   Dg Hip Unilat With Pelvis 2-3 Views Right  Result Date: 09/06/2019 CLINICAL DATA:  Status post fall, groin pain EXAM: DG HIP (WITH OR WITHOUT PELVIS) 2-3V RIGHT COMPARISON:  None. FINDINGS: Generalized osteopenia. No acute fracture or dislocation. Mild 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 IMPRESSION: No acute osseous injury of the right hip. Given the patient's age and osteopenia, if there is persistent clinical concern for an occult hip fracture, a MRI of the hip is recommended for increased sensitivity. Electronically Signed   By: Elige KoHetal  Patel   On: 09/06/2019 13:43    Procedures Procedures (including critical care time)  Medications Ordered in ED Medications  LORazepam (ATIVAN) injection 1 mg (has  no administration in time range)     Initial Impression / Assessment and Plan / ED Course  I have reviewed the triage vital signs and the nursing notes.  Pertinent labs & imaging results that were available during my care of the patient were reviewed by me and considered in my medical decision making (see chart for details).  Clinical Course as of Sep 07 1715  Tue Sep 07, 2019  1433 Patient had negative x-rays yesterday.  I am concerned about her weak grip strength and persistent pain and weakness in her leg.  CT scan did not show acute hip fracture but I do  think MRI is reasonable.  Also MRI her cervical spine considering her CT scan findings and her weak grip strength that is new.   [JK]  1715 Social work and PT consult ordered.    [JK]  1716 Care turned over to Dr Judd Lien   [JK]    Clinical Course User Index [JK] Linwood Dibbles, MD       Final Clinical Impressions(s) / ED Diagnoses  pending   Linwood Dibbles, MD 09/07/19 [phone]

## 2019-09-07 NOTE — ED Notes (Signed)
Discharge instructions discussed with pt. Pt verbalized understanding. Pt stable and ambulatory. No signature pad available. 

## 2019-09-08 ENCOUNTER — Other Ambulatory Visit: Payer: Self-pay | Admitting: Neurosurgery

## 2019-09-08 LAB — MRSA PCR SCREENING: MRSA by PCR: NEGATIVE

## 2019-09-08 LAB — SARS CORONAVIRUS 2 (TAT 6-24 HRS): SARS Coronavirus 2: NEGATIVE

## 2019-09-08 NOTE — Progress Notes (Signed)
  NEUROSURGERY PROGRESS NOTE   No issues overnight. Slept well. No concerns this am Denies new or progressive symptoms  EXAM:  BP 105/69 (BP Location: Left Arm)   Pulse 71   Temp 98 F (36.7 C) (Oral)   Resp 20   SpO2 94%   Awake, alert, oriented  Speech fluent, appropriate  CN grossly intact  LUE/LLE: grossly normal RUE: 4+ delt, 3 grip, 2 finger extension, 4- wrist extension RLE: 2 IP, 3 quad, 4 distal  IMPRESSION/PLAN 73 y.o. 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 Noted cord signal changes. Stable neurologically - Plan for C5-6 and C6-7 ACDF tomorrow with Dr Kathyrn Sheriff - NPO at midnight

## 2019-09-08 NOTE — Anesthesia Preprocedure Evaluation (Addendum)
Anesthesia Evaluation  Patient identified by MRN, date of birth, ID band Patient awake    Reviewed: Allergy & Precautions, NPO status , Patient's Chart, lab work & pertinent test results  History of Anesthesia Complications Negative for: history of anesthetic complications  Airway Mallampati: II  TM Distance: >3 FB Neck ROM: Limited    Dental  (+) Dental Advisory Given, Poor Dentition   Pulmonary neg pulmonary ROS,    Pulmonary exam normal        Cardiovascular negative cardio ROS Normal cardiovascular exam     Neuro/Psych negative psych ROS   GI/Hepatic negative GI ROS, Neg liver ROS,   Endo/Other  negative endocrine ROS  Renal/GU negative Renal ROS     Musculoskeletal negative musculoskeletal ROS (+)   Abdominal   Peds  Hematology negative hematology ROS (+)   Anesthesia Other Findings   Reproductive/Obstetrics                            Anesthesia Physical Anesthesia Plan  ASA: I  Anesthesia Plan: General   Post-op Pain Management:    Induction: Intravenous  PONV Risk Score and Plan: 4 or greater and Treatment may vary due to age or medical condition, Ondansetron and Dexamethasone  Airway Management Planned: Oral ETT and Video Laryngoscope Planned  Additional Equipment: None  Intra-op Plan:   Post-operative Plan: Extubation in OR  Informed Consent: I have reviewed the patients History and Physical, chart, labs and discussed the procedure including the risks, benefits and alternatives for the proposed anesthesia with the patient or authorized representative who has indicated his/her understanding and acceptance.     Dental advisory given  Plan Discussed with: CRNA and Anesthesiologist  Anesthesia Plan Comments:        Anesthesia Quick Evaluation

## 2019-09-08 NOTE — Evaluation (Signed)
Occupational Therapy Evaluation Patient Details Name: Sandra Parrish MRN: [id number] DOB: 03/22/1946 Today's Date: 09/08/2019    History of Present Illness Pt is a 73 yo female s/p fall plan is for C5-6, C6-7 ACDF on 09/09/19 and R sided weakness. Pt PMHx: no pertinent history.   Clinical Impression   Pt PTA: pt lives alone and reports no prior falls to this injury; independent with ADL and mobility. Pt currently performing be limited by bedrest until surgery tomorrow 11/19, poor activity tolerance and decreased ability to care for self due to spinal precautions. Limited to rolling  With supervisionA and scooting toward [facility] with minA with trendelenberg due to bed rest until sx. Education for log rolling and general C spinal precautions with handout provided. Pt seemed a little overwhelmed so limited OT session with set-upA to minA for UB ADL and MaxA for LB ADL. Pt requires continued OT skilled services for ADL, mobility and safety in setting TBD once pt  Has surgery and pt can get OOB. OT following acutely.    Follow Up Recommendations  SNF;Home health OT;Supervision/Assistance - 24 hour(TBD s/p sx)    Equipment Recommendations  3 in 1 bedside commode    Recommendations for Other Services       Precautions / Restrictions Precautions Precautions: Fall;Cervical Precaution Booklet Issued: Yes (comment) Precaution Comments: verbally discussed Required Braces or Orthoses: (will be given after sx) Restrictions Weight Bearing Restrictions: No      Mobility Bed Mobility Overal bed mobility: Needs Assistance Bed Mobility: Rolling Rolling: Supervision         General bed mobility comments: Limited to rolling and scooting toward HOB with trendelenberg due to bed rest until sx.  Transfers                 General transfer comment: deferred until after surgery    Balance                                           ADL either performed or assessed with  clinical judgement   ADL Overall ADL's : Needs assistance/impaired Eating/Feeding: Set up;Bed level   Grooming: Minimal assistance;Bed level   Upper Body Bathing: Set up;Bed level   Lower Body Bathing: Maximal assistance;Bed level   Upper Body Dressing : Set up;Bed level   Lower Body Dressing: Maximal assistance;Bed level       Toileting- Clothing Manipulation and Hygiene: Maximal assistance;Bed level         General ADL Comments: Pt limited by bedrest until surgery tomorrow 11/19, poor activity tolerance and decreased ability to care for self due to spinal precautions,     Vision   Vision Assessment?: No apparent visual deficits     Perception     Praxis      Pertinent Vitals/Pain Pain Assessment: 0-10 Pain Score: 5  Pain Location: neck Pain Descriptors / Indicators: Discomfort Pain Intervention(s): Limited activity within patient's tolerance     Hand Dominance Right   Extremity/Trunk Assessment Upper Extremity Assessment Upper Extremity Assessment: Generalized weakness   Lower Extremity Assessment Lower Extremity Assessment: Generalized weakness   Cervical / Trunk Assessment Cervical / Trunk Assessment: Other exceptions Cervical / Trunk Exceptions: s/p severe C spinal stenosis   Communication Communication Communication: No difficulties   Cognition Arousal/Alertness: Awake/alert Behavior During Therapy: WFL for tasks assessed/performed Overall Cognitive Status: Within Functional Limits for tasks assessed  General Comments       Exercises     Shoulder Instructions      Home Living Family/patient expects to be discharged to:: Private residence Living Arrangements: Alone   Type of Home: Apartment Home Access: Stairs to enter [employer] of Steps: 6+8 Entrance Stairs-Rails: Right Home Layout: One level     Bathroom Shower/Tub: Chief Strategy Officer: Standard      Home Equipment: None          Prior Functioning/Environment Level of Independence: Independent        Comments: Pt has 2 cats. pt was cooking, cleaning, driving        OT Problem List: Decreased strength;Decreased activity tolerance;Impaired balance (sitting and/or standing);Decreased safety awareness;Impaired UE functional use;Pain;Decreased knowledge of use of DME or AE;Decreased coordination      OT Treatment/Interventions: Self-care/ADL training;Therapeutic exercise;Neuromuscular education;Energy conservation;DME and/or AE instruction;Therapeutic activities;Patient/family education;Balance training    OT Goals(Current goals can be found in the care plan section) Acute Rehab OT Goals Patient Stated Goal: to go home  OT Goal Formulation: With patient Time For Goal Achievement: 09/22/19 Potential to Achieve Goals: Good ADL Goals Pt Will Perform Grooming: with set-up;standing Pt Will Perform Upper Body Dressing: with set-up;standing Pt Will Perform Lower Body Dressing: with min guard assist;with adaptive equipment;sitting/lateral leans;sit to/from stand Pt Will Transfer to Toilet: with min guard assist;ambulating;bedside commode Pt Will Perform Toileting - Clothing Manipulation and hygiene: with min guard assist;sitting/lateral leans;sit to/from stand  OT Frequency: Min 2X/week   Barriers to D/C: Decreased caregiver support          Co-evaluation              AM-PAC OT "6 Clicks" Daily Activity     Outcome Measure Help from another person eating meals?: None Help from another person taking care of personal grooming?: A Little Help from another person toileting, which includes using toliet, bedpan, or urinal?: A Lot Help from another person bathing (including washing, rinsing, drying)?: A Lot Help from another person to put on and taking off regular upper body clothing?: A Little Help from another person to put on and taking off regular lower body clothing?: A  Lot 6 Click Score: 16   End of Session Nurse Communication: Mobility status  Activity Tolerance: Patient tolerated treatment well Patient left: in bed;with call bell/phone within reach;with bed alarm set  OT Visit Diagnosis: Unsteadiness on feet (R26.81);Muscle weakness (generalized) (M62.81);Pain Pain - part of body: (neck)                Time: 6712-4580 OT Time Calculation (min): 34 min Charges:  OT General Charges $OT Visit: 1 Visit OT Evaluation $OT Eval Moderate Complexity: 1 Mod OT Treatments $Therapeutic Activity: 8-22 mins  Sandra Parrish Acute Rehabilitation Services Pager: [phone] Office: [phone]   Sandra Parrish 09/08/2019, 5:07 PM

## 2019-09-08 NOTE — Progress Notes (Signed)
PT Cancellation Note  Patient Details Name: Sandra Parrish MRN: [id number] DOB: [birth year]   Cancelled Treatment:    Reason Eval/Treat Not Completed: PT screened, no needs identified, will sign off  Noted pt to have surgery tomorrow 09/09/19 for cervical stenosis s/p fall with spinal cord signal changes and Rt sided weakness.   OT has completed a bed-level pre-op evaluation and further PT evaluation not indicated at this time.   PT is signing off and will await post-op orders when appropriate.   Barry Brunner, PT Pager [phone]  Rexanne Mano 09/08/2019, 9:32 AM

## 2019-09-09 ENCOUNTER — Inpatient Hospital Stay (HOSPITAL_COMMUNITY): Payer: Medicare Other | Admitting: Anesthesiology

## 2019-09-09 ENCOUNTER — Encounter (HOSPITAL_COMMUNITY): Payer: Self-pay

## 2019-09-09 ENCOUNTER — Inpatient Hospital Stay (HOSPITAL_COMMUNITY): Payer: Medicare Other

## 2019-09-09 ENCOUNTER — Encounter (HOSPITAL_COMMUNITY)
Admission: EM | Disposition: A | Discharge status: Rehabilitation Facility | Payer: Self-pay | Source: Home / Self Care | Attending: Neurosurgery

## 2019-09-09 HISTORY — PX: ANTERIOR CERVICAL DECOMP/DISCECTOMY FUSION: SHX1161

## 2019-09-09 SURGERY — ANTERIOR CERVICAL DECOMPRESSION/DISCECTOMY FUSION 2 LEVELS
Anesthesia: General

## 2019-09-09 MED ORDER — THROMBIN 5000 UNITS EX SOLR
CUTANEOUS | Status: AC
  Filled 2019-09-09: qty 5000

## 2019-09-09 MED ORDER — SUCCINYLCHOLINE CHLORIDE 200 MG/10ML IV SOSY
PREFILLED_SYRINGE | INTRAVENOUS | Status: AC
  Filled 2019-09-09: qty 10

## 2019-09-09 MED ORDER — ACETAMINOPHEN 325 MG PO TABS: 650.0000 mg | ORAL_TABLET | ORAL | Status: DC | PRN

## 2019-09-09 MED ORDER — OXYCODONE HCL 5 MG PO TABS: 5.0000 mg | ORAL_TABLET | Freq: Once | ORAL | Status: DC | PRN

## 2019-09-09 MED ORDER — FENTANYL CITRATE (PF) 100 MCG/2ML IJ SOLN
INTRAMUSCULAR | Status: DC | PRN
  Administered 2019-09-09: 50 ug via INTRAVENOUS
  Administered 2019-09-09: 75 ug via INTRAVENOUS
  Administered 2019-09-09: 25 ug via INTRAVENOUS
  Administered 2019-09-09 (×2): 50 ug via INTRAVENOUS

## 2019-09-09 MED ORDER — BISACODYL 10 MG RE SUPP: 10.0000 mg | Freq: Every day | RECTAL | Status: DC | PRN

## 2019-09-09 MED ORDER — FLEET ENEMA 7-19 GM/118ML RE ENEM: 1.0000 | ENEMA | Freq: Once | RECTAL | Status: DC | PRN

## 2019-09-09 MED ORDER — LIDOCAINE-EPINEPHRINE 1 %-1:100000 IJ SOLN
INTRAMUSCULAR | Status: DC | PRN
  Administered 2019-09-09: 3 mL

## 2019-09-09 MED ORDER — CEFAZOLIN SODIUM-DEXTROSE 2-3 GM-%(50ML) IV SOLR
INTRAVENOUS | Status: DC | PRN
  Administered 2019-09-09: 2 g via INTRAVENOUS

## 2019-09-09 MED ORDER — SODIUM CHLORIDE 0.9 % IV SOLN
INTRAVENOUS | Status: DC | PRN
  Administered 2019-09-09: 09:00:00 500 mL

## 2019-09-09 MED ORDER — BUPIVACAINE HCL 0.5 % IJ SOLN
INTRAMUSCULAR | Status: DC | PRN
  Administered 2019-09-09: 3 mL

## 2019-09-09 MED ORDER — MENTHOL 3 MG MT LOZG
1.0000 | LOZENGE | OROMUCOSAL | Status: DC | PRN
  Administered 2019-09-10: 3 mg via ORAL
  Filled 2019-09-09: qty 9

## 2019-09-09 MED ORDER — ONDANSETRON HCL 4 MG/2ML IJ SOLN: 4.0000 mg | Freq: Four times a day (QID) | INTRAMUSCULAR | Status: DC | PRN

## 2019-09-09 MED ORDER — DEXAMETHASONE SODIUM PHOSPHATE 10 MG/ML IJ SOLN
INTRAMUSCULAR | Status: AC
  Filled 2019-09-09: qty 1

## 2019-09-09 MED ORDER — SENNA 8.6 MG PO TABS
1.0000 | ORAL_TABLET | Freq: Two times a day (BID) | ORAL | Status: DC
  Administered 2019-09-09 – 2019-09-13 (×8): 8.6 mg via ORAL
  Filled 2019-09-09 (×8): qty 1

## 2019-09-09 MED ORDER — ONDANSETRON HCL 4 MG/2ML IJ SOLN
INTRAMUSCULAR | Status: DC | PRN
  Administered 2019-09-09: 4 mg via INTRAVENOUS

## 2019-09-09 MED ORDER — SODIUM CHLORIDE 0.9% FLUSH
3.0000 mL | Freq: Two times a day (BID) | INTRAVENOUS | Status: DC
  Administered 2019-09-10 – 2019-09-12 (×3): 3 mL via INTRAVENOUS

## 2019-09-09 MED ORDER — THROMBIN 5000 UNITS EX SOLR
OROMUCOSAL | Status: DC | PRN
  Administered 2019-09-09: 09:00:00 5 mL via TOPICAL

## 2019-09-09 MED ORDER — EPHEDRINE 5 MG/ML INJ
INTRAVENOUS | Status: AC
  Filled 2019-09-09: qty 10

## 2019-09-09 MED ORDER — DOCUSATE SODIUM 100 MG PO CAPS
100.0000 mg | ORAL_CAPSULE | Freq: Two times a day (BID) | ORAL | Status: DC
  Administered 2019-09-09: 100 mg via ORAL

## 2019-09-09 MED ORDER — LACTATED RINGERS IV SOLN
INTRAVENOUS | Status: DC | PRN
  Administered 2019-09-09 (×2): via INTRAVENOUS

## 2019-09-09 MED ORDER — ACETAMINOPHEN 650 MG RE SUPP: 650.0000 mg | RECTAL | Status: DC | PRN

## 2019-09-09 MED ORDER — FENTANYL CITRATE (PF) 250 MCG/5ML IJ SOLN
INTRAMUSCULAR | Status: AC
  Filled 2019-09-09: qty 5

## 2019-09-09 MED ORDER — THROMBIN 20000 UNITS EX SOLR
CUTANEOUS | Status: DC | PRN
  Administered 2019-09-09: 20 mL via TOPICAL

## 2019-09-09 MED ORDER — LIDOCAINE 2% (20 MG/ML) 5 ML SYRINGE
INTRAMUSCULAR | Status: AC
  Filled 2019-09-09: qty 5

## 2019-09-09 MED ORDER — ONDANSETRON HCL 4 MG/2ML IJ SOLN: 4.0000 mg | Freq: Once | INTRAMUSCULAR | Status: DC | PRN

## 2019-09-09 MED ORDER — SODIUM CHLORIDE 0.9 % IV SOLN: INTRAVENOUS | Status: DC

## 2019-09-09 MED ORDER — OXYCODONE HCL 5 MG/5ML PO SOLN: 5.0000 mg | Freq: Once | ORAL | Status: DC | PRN

## 2019-09-09 MED ORDER — LIDOCAINE 2% (20 MG/ML) 5 ML SYRINGE
INTRAMUSCULAR | Status: DC | PRN
  Administered 2019-09-09: 60 mg via INTRAVENOUS

## 2019-09-09 MED ORDER — PROPOFOL 10 MG/ML IV BOLUS
INTRAVENOUS | Status: AC
  Filled 2019-09-09: qty 40

## 2019-09-09 MED ORDER — LIDOCAINE-EPINEPHRINE 1 %-1:100000 IJ SOLN
INTRAMUSCULAR | Status: AC
  Filled 2019-09-09: qty 1

## 2019-09-09 MED ORDER — METHOCARBAMOL 500 MG PO TABS
500.0000 mg | ORAL_TABLET | Freq: Four times a day (QID) | ORAL | Status: DC | PRN
  Administered 2019-09-10 – 2019-09-12 (×4): 500 mg via ORAL
  Filled 2019-09-09 (×4): qty 1

## 2019-09-09 MED ORDER — HYDROCODONE-ACETAMINOPHEN 5-325 MG PO TABS: 1.0000 | ORAL_TABLET | ORAL | Status: DC | PRN

## 2019-09-09 MED ORDER — PROPOFOL 10 MG/ML IV BOLUS
INTRAVENOUS | Status: DC | PRN
  Administered 2019-09-09: 160 mg via INTRAVENOUS

## 2019-09-09 MED ORDER — FENTANYL CITRATE (PF) 100 MCG/2ML IJ SOLN: 25.0000 ug | INTRAMUSCULAR | Status: DC | PRN

## 2019-09-09 MED ORDER — CEFAZOLIN SODIUM-DEXTROSE 2-4 GM/100ML-% IV SOLN
INTRAVENOUS | Status: AC
  Filled 2019-09-09: qty 100

## 2019-09-09 MED ORDER — HYDROMORPHONE HCL 1 MG/ML IJ SOLN
0.5000 mg | INTRAMUSCULAR | Status: DC | PRN
  Administered 2019-09-10: 1 mg via INTRAVENOUS
  Filled 2019-09-09: qty 1

## 2019-09-09 MED ORDER — SUGAMMADEX SODIUM 200 MG/2ML IV SOLN
INTRAVENOUS | Status: DC | PRN
  Administered 2019-09-09: 160 mg via INTRAVENOUS

## 2019-09-09 MED ORDER — 0.9 % SODIUM CHLORIDE (POUR BTL) OPTIME
TOPICAL | Status: DC | PRN
  Administered 2019-09-09: 1000 mL

## 2019-09-09 MED ORDER — SODIUM CHLORIDE 0.9% FLUSH: 3.0000 mL | INTRAVENOUS | Status: DC | PRN

## 2019-09-09 MED ORDER — METHOCARBAMOL 1000 MG/10ML IJ SOLN
500.0000 mg | Freq: Four times a day (QID) | INTRAVENOUS | Status: DC | PRN
  Administered 2019-09-09: 500 mg via INTRAVENOUS
  Filled 2019-09-09 (×2): qty 5

## 2019-09-09 MED ORDER — PHENOL 1.4 % MT LIQD: 1.0000 | OROMUCOSAL | Status: DC | PRN

## 2019-09-09 MED ORDER — ROCURONIUM BROMIDE 10 MG/ML (PF) SYRINGE
PREFILLED_SYRINGE | INTRAVENOUS | Status: AC
  Filled 2019-09-09: qty 10

## 2019-09-09 MED ORDER — CEFAZOLIN SODIUM-DEXTROSE 2-4 GM/100ML-% IV SOLN
2.0000 g | Freq: Three times a day (TID) | INTRAVENOUS | Status: AC
  Administered 2019-09-09 – 2019-09-10 (×2): 2 g via INTRAVENOUS
  Filled 2019-09-09 (×2): qty 100

## 2019-09-09 MED ORDER — BUPIVACAINE HCL (PF) 0.5 % IJ SOLN
INTRAMUSCULAR | Status: AC
  Filled 2019-09-09: qty 30

## 2019-09-09 MED ORDER — CEFAZOLIN SODIUM-DEXTROSE 2-4 GM/100ML-% IV SOLN: 2.0000 g | INTRAVENOUS | Status: DC

## 2019-09-09 MED ORDER — OXYCODONE HCL 5 MG PO TABS
5.0000 mg | ORAL_TABLET | ORAL | Status: DC | PRN
  Administered 2019-09-09 – 2019-09-12 (×9): 10 mg via ORAL
  Administered 2019-09-12: 5 mg via ORAL
  Administered 2019-09-12 – 2019-09-13 (×2): 10 mg via ORAL
  Filled 2019-09-09 (×12): qty 2

## 2019-09-09 MED ORDER — ACETAMINOPHEN 500 MG PO TABS
1000.0000 mg | ORAL_TABLET | Freq: Four times a day (QID) | ORAL | Status: AC
  Administered 2019-09-09 – 2019-09-10 (×4): 1000 mg via ORAL
  Filled 2019-09-09 (×4): qty 2

## 2019-09-09 MED ORDER — DEXAMETHASONE SODIUM PHOSPHATE 10 MG/ML IJ SOLN
INTRAMUSCULAR | Status: DC | PRN
  Administered 2019-09-09: 10 mg via INTRAVENOUS

## 2019-09-09 MED ORDER — SODIUM CHLORIDE 0.9 % IV SOLN: 250.0000 mL | INTRAVENOUS | Status: DC

## 2019-09-09 MED ORDER — PHENYLEPHRINE HCL-NACL 10-0.9 MG/250ML-% IV SOLN
INTRAVENOUS | Status: DC | PRN
  Administered 2019-09-09: 25 ug/min via INTRAVENOUS

## 2019-09-09 MED ORDER — PHENYLEPHRINE 40 MCG/ML (10ML) SYRINGE FOR IV PUSH (FOR BLOOD PRESSURE SUPPORT)
PREFILLED_SYRINGE | INTRAVENOUS | Status: AC
  Filled 2019-09-09: qty 10

## 2019-09-09 MED ORDER — ONDANSETRON HCL 4 MG/2ML IJ SOLN
INTRAMUSCULAR | Status: AC
  Filled 2019-09-09: qty 2

## 2019-09-09 MED ORDER — THROMBIN 20000 UNITS EX SOLR
CUTANEOUS | Status: AC
  Filled 2019-09-09: qty 20000

## 2019-09-09 MED ORDER — ROCURONIUM BROMIDE 10 MG/ML (PF) SYRINGE
PREFILLED_SYRINGE | INTRAVENOUS | Status: DC | PRN
  Administered 2019-09-09: 60 mg via INTRAVENOUS

## 2019-09-09 MED ORDER — ONDANSETRON HCL 4 MG PO TABS: 4.0000 mg | ORAL_TABLET | Freq: Four times a day (QID) | ORAL | Status: DC | PRN

## 2019-09-09 MED ORDER — SENNOSIDES-DOCUSATE SODIUM 8.6-50 MG PO TABS: 1.0000 | ORAL_TABLET | Freq: Every evening | ORAL | Status: DC | PRN

## 2019-09-09 SURGICAL SUPPLY — 66 items
BAG DECANTER FOR FLEXI CONT (MISCELLANEOUS) ×2 IMPLANT
BENZOIN TINCTURE PRP APPL 2/3 (GAUZE/BANDAGES/DRESSINGS) IMPLANT
BLADE CLIPPER SURG (BLADE) IMPLANT
BLADE SURG 11 STRL SS (BLADE) ×2 IMPLANT
BLADE ULTRA TIP 2M (BLADE) IMPLANT
BNDG GAUZE ELAST 4 BULKY (GAUZE/BANDAGES/DRESSINGS) IMPLANT
BUR MATCHSTICK NEURO 3.0 LAGG (BURR) ×2 IMPLANT
CAGE LORDOTIC 6 SM (Cage) ×2 IMPLANT
CANISTER SUCT 3000ML PPV (MISCELLANEOUS) ×2 IMPLANT
CARTRIDGE OIL MAESTRO DRILL (MISCELLANEOUS) ×1 IMPLANT
COVER WAND RF STERILE (DRAPES) IMPLANT
DECANTER SPIKE VIAL GLASS SM (MISCELLANEOUS) ×2 IMPLANT
DERMABOND ADVANCED (GAUZE/BANDAGES/DRESSINGS) ×1
DERMABOND ADVANCED .7 DNX12 (GAUZE/BANDAGES/DRESSINGS) ×1 IMPLANT
DEVICE ENDSKLTN TC NANOLCK 6MM (Cage) ×1 IMPLANT
DIFFUSER DRILL AIR PNEUMATIC (MISCELLANEOUS) ×2 IMPLANT
DRAIN CHANNEL 10M FLAT 3/4 FLT (DRAIN) IMPLANT
DRAPE C-ARM 42X72 X-RAY (DRAPES) ×4 IMPLANT
DRAPE HALF SHEET 40X57 (DRAPES) IMPLANT
DRAPE LAPAROTOMY 100X72 PEDS (DRAPES) ×2 IMPLANT
DRAPE MICROSCOPE LEICA (MISCELLANEOUS) ×2 IMPLANT
DRSG OPSITE 4X5.5 SM (GAUZE/BANDAGES/DRESSINGS) ×4 IMPLANT
DRSG OPSITE POSTOP 3X4 (GAUZE/BANDAGES/DRESSINGS) ×2 IMPLANT
DURAPREP 6ML APPLICATOR 50/CS (WOUND CARE) ×2 IMPLANT
ELECT COATED BLADE 2.86 ST (ELECTRODE) ×4 IMPLANT
ELECT REM PT RETURN 9FT ADLT (ELECTROSURGICAL) ×2
ELECTRODE REM PT RTRN 9FT ADLT (ELECTROSURGICAL) ×1 IMPLANT
ENDOSKELETON TC NANOLOCK 6MM (Cage) ×2 IMPLANT
EVACUATOR SILICONE 100CC (DRAIN) IMPLANT
GAUZE 4X4 16PLY RFD (DISPOSABLE) IMPLANT
GLOVE BIO SURGEON STRL SZ7.5 (GLOVE) IMPLANT
GLOVE BIOGEL PI IND STRL 7.5 (GLOVE) ×2 IMPLANT
GLOVE BIOGEL PI INDICATOR 7.5 (GLOVE) ×2
GLOVE ECLIPSE 7.0 STRL STRAW (GLOVE) ×2 IMPLANT
GLOVE EXAM NITRILE XL STR (GLOVE) IMPLANT
GOWN STRL REUS W/ TWL LRG LVL3 (GOWN DISPOSABLE) ×2 IMPLANT
GOWN STRL REUS W/ TWL XL LVL3 (GOWN DISPOSABLE) IMPLANT
GOWN STRL REUS W/TWL 2XL LVL3 (GOWN DISPOSABLE) IMPLANT
GOWN STRL REUS W/TWL LRG LVL3 (GOWN DISPOSABLE) ×2
GOWN STRL REUS W/TWL XL LVL3 (GOWN DISPOSABLE)
HEMOSTAT POWDER KIT SURGIFOAM (HEMOSTASIS) ×2 IMPLANT
KIT BASIN OR (CUSTOM PROCEDURE TRAY) ×2 IMPLANT
KIT TURNOVER KIT B (KITS) ×2 IMPLANT
NEEDLE HYPO 22GX1.5 SAFETY (NEEDLE) ×2 IMPLANT
NEEDLE SPNL 22GX3.5 QUINCKE BK (NEEDLE) ×2 IMPLANT
NS IRRIG 1000ML POUR BTL (IV SOLUTION) ×2 IMPLANT
OIL CARTRIDGE MAESTRO DRILL (MISCELLANEOUS) ×2
PACK LAMINECTOMY NEURO (CUSTOM PROCEDURE TRAY) ×2 IMPLANT
PAD ARMBOARD 7.5X6 YLW CONV (MISCELLANEOUS) ×6 IMPLANT
PLATE 2 37.5XLCK NS SPNE CVD (Plate) ×1 IMPLANT
PLATE 2 ATLANTIS TRANS (Plate) ×1 IMPLANT
PUTTY DBF 1CC CORTICAL FIBERS (Putty) ×2 IMPLANT
RUBBERBAND STERILE (MISCELLANEOUS) ×4 IMPLANT
SCREW 4.0X15MM (Screw) ×12 IMPLANT
SPONGE INTESTINAL PEANUT (DISPOSABLE) ×2 IMPLANT
SPONGE SURGIFOAM ABS GEL 100 (HEMOSTASIS) ×2 IMPLANT
STRIP CLOSURE SKIN 1/2X4 (GAUZE/BANDAGES/DRESSINGS) ×2 IMPLANT
SUT ETHILON 3 0 FSL (SUTURE) IMPLANT
SUT VIC AB 3-0 SH 8-18 (SUTURE) ×2 IMPLANT
SUT VICRYL 3-0 RB1 18 ABS (SUTURE) ×2 IMPLANT
TAPE CLOTH 3X10 TAN LF (GAUZE/BANDAGES/DRESSINGS) ×2 IMPLANT
TOWEL GREEN STERILE (TOWEL DISPOSABLE) ×2 IMPLANT
TOWEL GREEN STERILE FF (TOWEL DISPOSABLE) ×2 IMPLANT
TRAY FOL W/BAG SLVR 16FR STRL (SET/KITS/TRAYS/PACK) ×1 IMPLANT
TRAY FOLEY W/BAG SLVR 16FR LF (SET/KITS/TRAYS/PACK) ×1
WATER STERILE IRR 1000ML POUR (IV SOLUTION) ×2 IMPLANT

## 2019-09-09 NOTE — Anesthesia Procedure Notes (Addendum)
Procedure Name: Intubation Date/Time: 09/09/2019 8:06 AM Performed by: Lowella Dell, CRNA Pre-anesthesia Checklist: Patient identified, Emergency Drugs available, Suction available and Patient being monitored Patient Re-evaluated:Patient Re-evaluated prior to induction Oxygen Delivery Method: Circle System Utilized Preoxygenation: Pre-oxygenation with 100% oxygen Induction Type: IV induction Ventilation: Mask ventilation without difficulty Laryngoscope Size: Glidescope and 3 (*elective d/t neuro symptoms*) Grade View: Grade I Tube type: Oral Tube size: 7.0 mm Number of attempts: 1 Airway Equipment and Method: Stylet Placement Confirmation: ETT inserted through vocal cords under direct vision,  positive ETCO2 and breath sounds checked- equal and bilateral Secured at: 21 cm Tube secured with: Tape Dental Injury: Teeth and Oropharynx as per pre-operative assessment  Comments: Head and neck maintained in neutral position throughout MV and VL.

## 2019-09-09 NOTE — Transfer of Care (Signed)
Immediate Anesthesia Transfer of Care Note  Patient: [place]  Procedure(s) Performed: ANTERIOR CERVICAL DECOMPRESSION/DISCECTOMY FUSION CERVICAL FIVE- CERVICAL SIX, CERVICAL SIX- CERVICAL SEVEN (N/A )  Patient Location: PACU  Anesthesia Type:General  Level of Consciousness: awake, alert , oriented and patient cooperative  Airway & Oxygen Therapy: Patient Spontanous Breathing and Patient connected to face mask oxygen  Post-op Assessment: Report given to RN and Post -op Vital signs reviewed and stable  Post vital signs: Reviewed and stable  Last Vitals:  Vitals Value Taken Time  BP 140/81   Temp    Pulse 82   Resp 14   SpO2 98     Last Pain:  Vitals:   09/09/19 0334  TempSrc: Oral  PainSc:       Patients Stated Pain Goal: 3 (30/94/07 6808)  Complications: No apparent anesthesia complications

## 2019-09-09 NOTE — Progress Notes (Signed)
Orthopedic Tech Progress Note Patient Details:  [place] 09/13/1946 [id number]  Ortho Devices Type of Ortho Device: Soft collar Ortho Device/Splint Interventions: Application   Post Interventions Patient Tolerated: Well Instructions Provided: Care of device   [place] Pink 09/09/2019, 11:35 AM

## 2019-09-09 NOTE — Progress Notes (Signed)
  NEUROSURGERY PROGRESS NOTE   No issues overnight.   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 Risks of the procedure were reviewed including risk of spinal cord injury leading to worsening weakness/numbness/tingling, vascular injury leading to life-threatening bleeding, stroke, dysphagia, infection, and need for additional surgeries. All her questions were answered today and consent was obtained.

## 2019-09-09 NOTE — Anesthesia Postprocedure Evaluation (Signed)
Anesthesia Post Note  Patient: Sandra Parrish  Procedure(s) Performed: ANTERIOR CERVICAL DECOMPRESSION/DISCECTOMY FUSION CERVICAL FIVE- CERVICAL SIX, CERVICAL SIX- CERVICAL SEVEN (N/A )     Patient location during evaluation: PACU Anesthesia Type: General Level of consciousness: awake and alert Pain management: pain level controlled Vital Signs Assessment: post-procedure vital signs reviewed and stable Respiratory status: spontaneous breathing, nonlabored ventilation and respiratory function stable Cardiovascular status: blood pressure returned to baseline and stable Postop Assessment: no apparent nausea or vomiting Anesthetic complications: no    Last Vitals:  Vitals:   09/09/19 1120 09/09/19 1204  BP: (!) 148/78 137/70  Pulse:  72  Resp:  16  Temp:  36.5 C  SpO2:  94%    Last Pain:  Vitals:   09/09/19 1204  TempSrc: Oral  PainSc:                  [name]

## 2019-09-10 ENCOUNTER — Encounter (HOSPITAL_COMMUNITY): Payer: Self-pay | Admitting: Neurosurgery

## 2019-09-10 DIAGNOSIS — K5903 Drug induced constipation: Secondary | ICD-10-CM

## 2019-09-10 DIAGNOSIS — G992 Myelopathy in diseases classified elsewhere: Secondary | ICD-10-CM

## 2019-09-10 DIAGNOSIS — R001 Bradycardia, unspecified: Secondary | ICD-10-CM

## 2019-09-10 DIAGNOSIS — D62 Acute posthemorrhagic anemia: Secondary | ICD-10-CM

## 2019-09-10 DIAGNOSIS — M4802 Spinal stenosis, cervical region: Principal | ICD-10-CM

## 2019-09-10 DIAGNOSIS — G8918 Other acute postprocedural pain: Secondary | ICD-10-CM

## 2019-09-10 DIAGNOSIS — R339 Retention of urine, unspecified: Secondary | ICD-10-CM

## 2019-09-10 LAB — CBC
HCT: 36.2 % (ref 36.0–46.0)
Hemoglobin: 11.6 g/dL — ABNORMAL LOW (ref 12.0–15.0)
MCH: 30.4 pg (ref 26.0–34.0)
MCHC: 32 g/dL (ref 30.0–36.0)
MCV: 94.8 fL (ref 80.0–100.0)
Platelets: 246 10*3/uL (ref 150–400)
RBC: 3.82 MIL/uL — ABNORMAL LOW (ref 3.87–5.11)
RDW: 12.4 % (ref 11.5–15.5)
WBC: 10 10*3/uL (ref 4.0–10.5)
nRBC: 0 % (ref 0.0–0.2)

## 2019-09-10 LAB — BASIC METABOLIC PANEL
Anion gap: 10 (ref 5–15)
BUN: 7 mg/dL — ABNORMAL LOW (ref 8–23)
CO2: 23 mmol/L (ref 22–32)
Calcium: 8 mg/dL — ABNORMAL LOW (ref 8.9–10.3)
Chloride: 107 mmol/L (ref 98–111)
Creatinine, Ser: 0.65 mg/dL (ref 0.44–1.00)
GFR calc Af Amer: >60 mL/min (ref >60)
GFR calc non Af Amer: >60 mL/min (ref >60)
Glucose, Bld: 103 mg/dL — ABNORMAL HIGH (ref 70–99)
Potassium: 3.6 mmol/L (ref 3.5–5.1)
Sodium: 140 mmol/L (ref 135–145)

## 2019-09-10 LAB — PROTIME-INR
INR: 1.1 (ref 0.8–1.2)
Prothrombin Time: 13.8 seconds (ref 11.4–15.2)

## 2019-09-10 LAB — APTT: aPTT: 28 seconds (ref 24–36)

## 2019-09-10 MED ORDER — CHLORHEXIDINE GLUCONATE CLOTH 2 % EX PADS
6.0000 | MEDICATED_PAD | Freq: Every day | CUTANEOUS | Status: DC
  Administered 2019-09-11 – 2019-09-12 (×2): 6 via TOPICAL

## 2019-09-10 MED ORDER — TAMSULOSIN HCL 0.4 MG PO CAPS
0.4000 mg | ORAL_CAPSULE | Freq: Every day | ORAL | Status: DC
  Administered 2019-09-10 – 2019-09-12 (×3): 0.4 mg via ORAL
  Filled 2019-09-10 (×3): qty 1

## 2019-09-10 NOTE — Progress Notes (Signed)
  NEUROSURGERY PROGRESS NOTE   Continued issues with urinary retention requiring I/O Complains of mild neck soreness and dysphagia although advancing diet well. Stable right hemiparesis  EXAM:  BP (!) 131/54 (BP Location: Left Arm)   Pulse 65   Temp (!) 97.5 F (36.4 C) (Oral)   Resp 18   SpO2 93%   Awake, alert, oriented  Speech fluent, appropriate  CN grossly intact  5/5 right deltoid, bicep, 4/5 right tricep, WR, 3/5 grip 5/5 LUE 3/5 right hip flexion, 3/5 quad, 5/5 PF/DF 5/5 LLE  IMPRESSION/PLAN 73 y.o. female pod #1 C5-6 and C6-7 ACDF for cervical stenosis with myelopathy. Stable neurologically with right hemiparesis.   - Urinary retention: flomax, foley, bladder rest - continue PT/OT. Possible CIR candidate - supportive care

## 2019-09-10 NOTE — Evaluation (Signed)
Physical Therapy Evaluation Patient Details Name: Sandra Parrish MRN: [id number] DOB: May 12, 1946 Today's Date: 09/10/2019   History of Present Illness  73 y.o. female s/p fall presenting with primarily subjective right arm/leg weakness confirmed on exam. 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 She underwent ACDF 09/09/19.    Clinical Impression  Pt admitted with above diagnosis. PTA pt active and independent. On eval, she required mod assist bed mobility, mod assist sit to stand, and +2 mod assist SPT with RW. Unable to progress gait due to LE weakness. R knee blocked for sit to stand and buckling noted during SPT.  Pt currently with functional limitations due to the deficits listed below (see PT Problem List). Pt will benefit from skilled PT to increase their independence and safety with mobility to allow discharge to the venue listed below.       Follow Up Recommendations CIR    Equipment Recommendations  Other (comment)(TBD)    Recommendations for Other Services Rehab consult     Precautions / Restrictions Precautions Precautions: Fall;Cervical Precaution Comments: reviewed cervical precautions Required Braces or Orthoses: Cervical Brace Cervical Brace: Soft collar;At all times Restrictions Weight Bearing Restrictions: No      Mobility  Bed Mobility Overal bed mobility: Needs Assistance Bed Mobility: Rolling;Sidelying to Sit Rolling: Min assist Sidelying to sit: Mod assist;HOB elevated       General bed mobility comments: +rail, increased time, cues for sequencing, use of bed pad to scoot to EOB  Transfers Overall transfer level: Needs assistance Equipment used: Rolling walker (2 wheeled) Transfers: Sit to/from [employer] Sit to Stand: From elevated surface;Mod assist Stand pivot transfers: Mod assist;+2 physical assistance;+2 safety/equipment       General transfer comment:  Therapist blocked R knee for sit to stand. Cues for hand placement/sequencing. Pivot steps toward L with RW bed to recliner. Multi episodes of R knee buckling with mod assist to maintain stance.  Ambulation/Gait             General Gait Details: unable  Stairs            Wheelchair Mobility    Modified Rankin (Stroke Patients Only)       Balance Overall balance assessment: Needs assistance Sitting-balance support: No upper extremity supported;Feet supported Sitting balance-Leahy Scale: Good     Standing balance support: Bilateral upper extremity supported;During functional activity Standing balance-Leahy Scale: Poor Standing balance comment: reliant on RW and therapist support                             Pertinent Vitals/Pain Pain Assessment: 0-10 Pain Score: 3  Pain Location: neck Pain Descriptors / Indicators: Discomfort Pain Intervention(s): Monitored during session;Repositioned    Home Living Family/patient expects to be discharged to:: Private residence Living Arrangements: Alone Available Help at Discharge: Friend(s);Available PRN/intermittently Type of Home: Apartment Home Access: Stairs to enter Entrance Stairs-Rails: Right Entrance Stairs-Number of Steps: 6+8 Home Layout: One level Home Equipment: None      Prior Function Level of Independence: Independent         Comments: Pt has 2 cats. pt was cooking, cleaning, driving     Hand Dominance   Dominant Hand: Right    Extremity/Trunk Assessment   Upper Extremity Assessment Upper Extremity Assessment: Defer to OT evaluation    Lower Extremity Assessment Lower Extremity Assessment: Generalized weakness;RLE deficits/detail;LLE deficits/detail RLE Deficits / Details:  grossly 3/5, numbness/tingling reported bilat thighs LLE Deficits / Details: grossly 4/5, numbness/tingling reported bilat thighs    Cervical / Trunk Assessment Cervical / Trunk Assessment: Other  exceptions Cervical / Trunk Exceptions: s/p ACDF  Communication   Communication: No difficulties  Cognition Arousal/Alertness: Awake/alert Behavior During Therapy: WFL for tasks assessed/performed Overall Cognitive Status: Within Functional Limits for tasks assessed                                        General Comments General comments (skin integrity, edema, etc.): VSS    Exercises     Assessment/Plan    PT Assessment Patient needs continued PT services  PT Problem List Decreased strength;Decreased mobility;Decreased coordination;Decreased knowledge of precautions;Decreased activity tolerance;Pain;Decreased knowledge of use of DME;Decreased balance       PT Treatment Interventions DME instruction;Therapeutic activities;Gait training;Therapeutic exercise;Stair training;Balance training;Functional mobility training;Patient/family education;Neuromuscular re-education    PT Goals (Current goals can be found in the Care Plan section)  Acute Rehab PT Goals Patient Stated Goal: to go home  PT Goal Formulation: With patient Time For Goal Achievement: 09/24/19 Potential to Achieve Goals: Good    Frequency Min 5X/week   Barriers to discharge Inaccessible home environment 14 steps to enter apartment    Co-evaluation               AM-PAC PT "6 Clicks" Mobility  Outcome Measure Help needed turning from your back to your side while in a flat bed without using bedrails?: A Little Help needed moving from lying on your back to sitting on the side of a flat bed without using bedrails?: A Lot Help needed moving to and from a bed to a chair (including a wheelchair)?: A Lot Help needed standing up from a chair using your arms (e.g., wheelchair or bedside chair)?: A Lot Help needed to walk in hospital room?: A Lot Help needed climbing 3-5 steps with a railing? : Total 6 Click Score: 12    End of Session Equipment Utilized During Treatment: Gait belt;Cervical  collar Activity Tolerance: Patient tolerated treatment well Patient left: in chair;with call bell/phone within reach Nurse Communication: Mobility status PT Visit Diagnosis: Unsteadiness on feet (R26.81);Muscle weakness (generalized) (M62.81);Pain;Other abnormalities of gait and mobility (R26.89)    Time: 5035-4656 PT Time Calculation (min) (ACUTE ONLY): 29 min   Charges:   PT Evaluation $PT Eval Moderate Complexity: 1 Mod PT Treatments $Therapeutic Activity: 8-22 mins        Lorrin Goodell, PT  Office # [phone] Pager [phone]   Lorriane Shire 09/10/2019, 11:22 AM

## 2019-09-10 NOTE — Progress Notes (Signed)
Occupational Therapy Treatment Patient Details Name: Sandra Parrish MRN: [id number] DOB: 06-10-1946 Today's Date: 09/10/2019    History of present illness 73 y.o. female s/p fall presenting with primarily subjective right arm/leg weakness confirmed on exam. 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 She underwent ACDF 09/09/19.   OT comments  OT re-assessment s/p ACDF. Pt PTA: living alone and independent. Pt minA overall for bed mobility, but maximal cueing for log roll. Pt currently maxA overall for ADL and very lethargic, poor memory with current pain medicine. Pt modA for lateral scoot to L side. Pt's BUEs fatigue easily, but pt very motivated to return to PLOF. Pt sitting upright in chair ~2.5 hours today. Pt requires higher level cognitive therapy as pt's medication decreases her short term memory as she had no carry over from PT visit this AM and the OT visit from yesterday. Pt would benefit from continued OT skilled services for ADL, mobility and safety in CIR setting. OT following acutely.    Follow Up Recommendations  CIR;Supervision/Assistance - 24 hour    Equipment Recommendations  Other (comment)(to be determined)    Recommendations for Other Services      Precautions / Restrictions Precautions Precautions: Fall;Cervical Precaution Booklet Issued: Yes (comment) Precaution Comments: reviewed cervical precautions Required Braces or Orthoses: Cervical Brace Cervical Brace: Soft collar;At all times Restrictions Weight Bearing Restrictions: No       Mobility Bed Mobility Overal bed mobility: Needs Assistance Bed Mobility: Sit to Supine;Sit to Sidelying Rolling: Min assist Sidelying to sit: Mod assist;HOB elevated   Sit to supine: Min assist Sit to sidelying: Min assist General bed mobility comments: Increased time, cues for sequencing  Transfers Overall transfer level: Needs assistance Equipment  used: Rolling walker (2 wheeled) Transfers: Lateral/Scoot Transfers Sit to Stand: From elevated surface;Mod assist Stand pivot transfers: Mod assist;+2 physical assistance;+2 safety/equipment      Lateral/Scoot Transfers: Mod assist;Max assist General transfer comment: Pt performing lateral scoot to L towards stronger side. Pt with pain in BUEs after scoot.    Balance Overall balance assessment: Needs assistance Sitting-balance support: No upper extremity supported;Feet supported Sitting balance-Leahy Scale: Good     Standing balance support: Bilateral upper extremity supported;During functional activity Standing balance-Leahy Scale: Poor Standing balance comment: scooting for transfer                           ADL either performed or assessed with clinical judgement   ADL Overall ADL's : Needs assistance/impaired Eating/Feeding: Modified independent;Sitting   Grooming: Minimal assistance;Bed level   Upper Body Bathing: Set up;Bed level   Lower Body Bathing: Maximal assistance;Bed level   Upper Body Dressing : Set up;Bed level   Lower Body Dressing: Maximal assistance;Bed level       Toileting- Clothing Manipulation and Hygiene: Maximal assistance;Bed level       Functional mobility during ADLs: Moderate assistance;+2 for safety/equipment;+2 for physical assistance General ADL Comments: Pt requiring increased assist for ADL and mobility. Pt limited by decreased strength, decreased activity tolerance and decreased mobility.      Vision   Vision Assessment?: No apparent visual deficits   Perception     Praxis      Cognition Arousal/Alertness: Awake/alert Behavior During Therapy: WFL for tasks assessed/performed Overall Cognitive Status: Within Functional Limits for tasks assessed  Exercises     Shoulder Instructions       General Comments VSS    Pertinent Vitals/ Pain       Pain  Assessment: 0-10 Pain Score: 3  Pain Location: neck Pain Descriptors / Indicators: Discomfort Pain Intervention(s): Monitored during session  Home Living Family/patient expects to be discharged to:: Private residence Living Arrangements: Alone Available Help at Discharge: Friend(s);Available PRN/intermittently Type of Home: Apartment Home Access: Stairs to enter Entrance Stairs-Number of Steps: 6+8 Entrance Stairs-Rails: Right Home Layout: One level     Bathroom Shower/Tub: Teacher, early years/pre: Standard     Home Equipment: None          Prior Functioning/Environment Level of Independence: Independent        Comments: Pt has 2 cats. pt was cooking, cleaning, driving   Frequency  Min 2X/week        Progress Toward Goals  OT Goals(current goals can now be found in the care plan section)  Progress towards OT goals: Progressing toward goals  Acute Rehab OT Goals Patient Stated Goal: to go home  OT Goal Formulation: With patient Time For Goal Achievement: 09/22/19 Potential to Achieve Goals: Good ADL Goals Pt Will Perform Grooming: with set-up;standing Pt Will Perform Upper Body Dressing: with set-up;standing Pt Will Perform Lower Body Dressing: with min guard assist;with adaptive equipment;sitting/lateral leans;sit to/from stand Pt Will Transfer to Toilet: with min guard assist;ambulating;bedside commode Pt Will Perform Toileting - Clothing Manipulation and hygiene: with min guard assist;sitting/lateral leans;sit to/from stand  Plan Discharge plan remains appropriate    Co-evaluation                 AM-PAC OT "6 Clicks" Daily Activity     Outcome Measure   Help from another person eating meals?: None Help from another person taking care of personal grooming?: A Little Help from another person toileting, which includes using toliet, bedpan, or urinal?: A Lot Help from another person bathing (including washing, rinsing, drying)?: A  Lot Help from another person to put on and taking off regular upper body clothing?: A Little Help from another person to put on and taking off regular lower body clothing?: A Lot 6 Click Score: 16    End of Session Equipment Utilized During Treatment: Cervical collar  OT Visit Diagnosis: Unsteadiness on feet (R26.81);Muscle weakness (generalized) (M62.81);Pain Pain - Right/Left: Right Pain - part of body: Arm   Activity Tolerance Patient tolerated treatment well   Patient Left in bed;with call bell/phone within reach;with bed alarm set   Nurse Communication Mobility status        Time: 0940-7680 OT Time Calculation (min): 34 min  Charges: OT General Charges $OT Visit: 1 Visit OT Treatments $Self Care/Home Management : 8-22 mins $Therapeutic Activity: 8-22 mins  Darryl Nestle) Marsa Aris OTR/L Acute Rehabilitation Services Pager: [phone] Office: [phone]    KMMNOTR R [facility] 09/10/2019, 12:12 PM

## 2019-09-10 NOTE — Consult Note (Signed)
Physical Medicine and Rehabilitation Consult Reason for Consult: Right side weakness Referring Physician: Dr. Conchita Paris   HPI: Sandra Parrish is a 73 y.o. right-handed female with unremarkable past medical history.  Per chart review and patient, patient lives alone.  She was independent prior to admission.  She does not have any family in the area, but does have a neighbor and some friends.  1 level home 6 steps to entry.  Presented 09/07/2019 after fall without loss of consciousness and progressive RUE/RLE weakness and tingling.  Cranial CT unremarkable for acute intracranial process.  There was midline frontal scalp swelling contusion with small hematoma measuring 4 mm in maximal thickness.  CT/ MRI cervical spine showed abnormal cord edema or myelomalacia extending from C4-5 through the mid C7 level associated severe central narrowing of the thecal sac at C5-6 and moderate central narrowing C6-7 with myelopathy.  Underwent discectomy at C5-6, 6-7 for decompression of spinal cord and existing nerve roots placement of anterior instrumentation consisting of interbody plate and screws arthrodesis on 09/09/2019 per Dr. Conchita Paris.  Hospital course complicated by pain, urinary retention.  Indwelling Foley was placed and she was started on Flomax.  Cervical collar when out of bed.  Therapy evaluation completed with recommendations of physical medicine rehab consult.   Review of Systems  Constitutional: Negative for chills and fever.  HENT: Negative for hearing loss.   Eyes: Negative for blurred vision and double vision.  Respiratory: Negative for cough and shortness of breath.   Cardiovascular: Negative for chest pain, palpitations and leg swelling.  Gastrointestinal: Positive for constipation. Negative for heartburn and nausea.  Genitourinary: Negative for dysuria, flank pain and hematuria.  Musculoskeletal: Positive for myalgias.  Skin: Negative for rash.  Neurological: Positive for  dizziness, tingling, focal weakness and weakness.  Psychiatric/Behavioral: Negative.    No past medical history. No past surgical history. No pertinent family history of trauma Social History:  reports that she has never smoked. She has never used smokeless tobacco.  Denies excessive alcohol use. Allergies:  Allergies  Allergen Reactions  . Latex Itching   Medications Prior to Admission  Medication Sig Dispense Refill  . aspirin EC 325 MG tablet Take 650 mg by mouth daily as needed (pain/headache).    Marland Kitchen HYDROcodone-acetaminophen (NORCO/VICODIN) 5-325 MG tablet Take 1 tablet by mouth every 6 (six) hours as needed. 8 tablet 0    Home: Home Living Family/patient expects to be discharged to:: Private residence Living Arrangements: Alone Type of Home: Apartment Home Access: Stairs to enter [employer] of Steps: 6+8 Entrance Stairs-Rails: Right Home Layout: One level Bathroom Shower/Tub: Engineer, manufacturing systems: Standard Home Equipment: None  Functional History: Prior Function Level of Independence: Independent Comments: Pt has 2 cats. pt was cooking, cleaning, driving Functional Status:  Mobility: Bed Mobility Overal bed mobility: Needs Assistance Bed Mobility: Rolling Rolling: Supervision General bed mobility comments: Limited to rolling and scooting toward HOB with trendelenberg due to bed rest until sx. Transfers General transfer comment: deferred until after surgery      ADL: ADL Overall ADL's : Needs assistance/impaired Eating/Feeding: Set up, Bed level Grooming: Minimal assistance, Bed level Upper Body Bathing: Set up, Bed level Lower Body Bathing: Maximal assistance, Bed level Upper Body Dressing : Set up, Bed level Lower Body Dressing: Maximal assistance, Bed level Toileting- Clothing Manipulation and Hygiene: Maximal assistance, Bed level General ADL Comments: Pt limited by bedrest until surgery tomorrow 11/19, poor activity tolerance and  decreased ability to care for  self due to spinal precautions,  Cognition: Cognition Overall Cognitive Status: Within Functional Limits for tasks assessed Orientation Level: Oriented X4 Cognition Arousal/Alertness: Awake/alert Behavior During Therapy: WFL for tasks assessed/performed Overall Cognitive Status: Within Functional Limits for tasks assessed  Blood pressure (!) 131/54, pulse 65, temperature (!) 97.5 F (36.4 C), temperature source Oral, resp. rate 18, SpO2 93 %. Physical Exam  Vitals reviewed. Constitutional: She appears well-developed and well-nourished.  Facial bruising with edema  Neck:  + Soft collar.  Respiratory: Effort normal. No respiratory distress.  GI: She exhibits no distension.  Musculoskeletal:     Comments: No edema or tenderness in extremities  Neurological: She is alert.  Alert sitting up in chair.   Follows full commands.   Fair awareness of deficits. Motor: Left upper extremity: Shoulder abduction, elbow flexion, 4-4+/5, elbow extension 3/5, wrist extension 4-4+/5, DI 2+/5 Right upper extremity: Shoulder abduction, elbow flexion/extension 3 -/5, wrist extension 4/5, handgrip 2+/5 Bilateral lower extremity: 4+/5 proximal to distal  Skin:  Surgical site clean and dry with cervical collar in place.   She does have healing abrasions to the forehead and nose  Psychiatric: She has a normal mood and affect. Her behavior is normal.    Results for orders placed or performed during the hospital encounter of 09/07/19 (from the past 24 hour(s))  CBC     Status: Abnormal   Collection Time: 09/10/19  6:33 AM  Result Value Ref Range   WBC 10.0 4.0 - 10.5 K/uL   RBC 3.82 (L) 3.87 - 5.11 MIL/uL   Hemoglobin 11.6 (L) 12.0 - 15.0 g/dL   HCT 36.2 36.0 - 46.0 %   MCV 94.8 80.0 - 100.0 fL   MCH 30.4 26.0 - 34.0 pg   MCHC 32.0 30.0 - 36.0 g/dL   RDW 12.4 11.5 - 15.5 %   Platelets 246 150 - 400 K/uL   nRBC 0.0 0.0 - 0.2 %  Basic Metabolic Panel     Status:  Abnormal   Collection Time: 09/10/19  6:33 AM  Result Value Ref Range   Sodium 140 135 - 145 mmol/L   Potassium 3.6 3.5 - 5.1 mmol/L   Chloride 107 98 - 111 mmol/L   CO2 23 22 - 32 mmol/L   Glucose, Bld 103 (H) 70 - 99 mg/dL   BUN 7 (L) 8 - 23 mg/dL   Creatinine, Ser 0.65 0.44 - 1.00 mg/dL   Calcium 8.0 (L) 8.9 - 10.3 mg/dL   GFR calc non Af Amer >60 >60 mL/min   GFR calc Af Amer >60 >60 mL/min   Anion gap 10 5 - 15  Protime-INR     Status: None   Collection Time: 09/10/19  6:33 AM  Result Value Ref Range   Prothrombin Time 13.8 11.4 - 15.2 seconds   INR 1.1 0.8 - 1.2  APTT     Status: None   Collection Time: 09/10/19  6:33 AM  Result Value Ref Range   aPTT 28 24 - 36 seconds   Dg Cervical Spine 2-3 Views  Result Date: 09/09/2019 CLINICAL DATA:  Cervical spine fusion EXAM: CERVICAL SPINE - 2-3 VIEW COMPARISON:  None. FINDINGS: Two lateral intraoperative radiographs are available. There is multilevel degenerative disc disease with disc height loss, endplate osteophytes, and endplate irregularity. Multilevel facet hypertrophy is noted. On the second image, there has been anterior fusion at C5-C7 with plate and screw fixation and interbody grafts. IMPRESSION: Post ACDF at C5-C7. Electronically Signed   By: Malachi Carl   M.D.   On: 09/09/2019 10:18   Dg C-arm 1-60 Min  Result Date: 09/09/2019 CLINICAL DATA:  Anterior cervical fusion EXAM: DG C-ARM 1-60 MIN FLUOROSCOPY TIME:  Fluoroscopy Time:  6 seconds COMPARISON:  None. FINDINGS: Two lateral intraoperative radiographs are submitted. Multilevel spondylosis is present with disc height loss, endplate osteophytes, endplate irregularity, and facet hypertrophy. On the second image, there has been anterior fusion at C5-C7 with plate and screw fixation and interbody grafts. IMPRESSION: Fluoroscopic guidance for ACDF at C5-C7. See operative note for additional details. Electronically Signed   By: Guadlupe SpanishPraneil   M.D.   On: 09/09/2019 10:19     Assessment/Plan: Diagnosis: Cervical myelopathy Labs and images (see above) independently reviewed.  Records reviewed and summated above.  1. Does the need for close, 24 hr/day medical supervision in concert with the patient's rehab needs make it unreasonable for this patient to be served in a less intensive setting? Likely 2. Co-Morbidities requiring supervision/potential complications: Bradycardia (monitor with increased activity), acute blood loss anemia (repeat labs, consider transfusion if necessary to ensure appropriate perfusion for increased activity tolerance), urinary retention (considering deseeding Foley with I/O cath as necessary), postoperative pain (Biofeedback training with therapies to help reduce reliance on opiate pain medications, monitor pain control during therapies, and sedation at rest and titrate to maximum efficacy to ensure participation and gains in therapies), drug-induced constipation (adjust bowel meds as necessary) 3. Due to bladder management, bowel management, safety, skin/wound care, disease management, pain management and patient education, does the patient require 24 hr/day rehab nursing? Yes 4. Does the patient require coordinated care of a physician, rehab nurse, therapy disciplines of PT/OT to address physical and functional deficits in the context of the above medical diagnosis(es)? Yes Addressing deficits in the following areas: balance, endurance, locomotion, strength, transferring, bathing, dressing, toileting and psychosocial support 5. Can the patient actively participate in an intensive therapy program of at least 3 hrs of therapy per day at least 5 days per week? Likely 6. The potential for patient to make measurable gains while on inpatient rehab is excellent 7. Anticipated functional outcomes upon discharge from inpatient rehab are Likely supervision  with PT, Likely supervision/min a with OT, n/a with SLP. 8. Estimated rehab length of stay to reach the  above functional goals is: TBD 9. Anticipated discharge destination: Home 10. Overall Rehab/Functional Prognosis: good  RECOMMENDATIONS: This patient's condition is appropriate for continued rehabilitative care in the following setting: Will await therapy evaluations, likely CIR. Patient has agreed to participate in recommended program. Yes Note that insurance prior authorization may be required for reimbursement for recommended care.  Comment: Rehab Admissions Coordinator to follow up.  I have personally performed a face to face diagnostic evaluation, including, but not limited to relevant history and physical exam findings, of this patient and developed relevant assessment and plan.  Additionally, I have reviewed and concur with the physician assistant's documentation above.   Mcarthur RossettiDaniel J Angiulli, PA-C 09/10/2019  Maryla MorrowAnkit , MD, ABPMR

## 2019-09-10 NOTE — Progress Notes (Signed)
Inpatient Rehabilitation Admissions Coordinator  Inpatient rehab consult received. I met with patient at bedside for rehab assessment. We discussed goals and expectations of an inpt rehab admit. Patient would like to discuss with her friends over the weekend and I will follow up with patient Monday. Patient overwhelmed with her decisions to be made at this time.  Danne Baxter, RN, MSN Rehab Admissions Coordinator [phone] 09/10/2019 12:13 PM

## 2019-09-10 NOTE — Op Note (Signed)
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 Discectomy at C5-6, C6-7 for decompression of spinal cord and exiting nerve roots  2. Placement of intervertebral biomechanical device, Medtronic Titan 6 mm lordotic x2 3. Placement of anterior instrumentation consisting of interbody plate and screws spanning Medtronic translational plate with 15 mm screws in C5, C6, C7 4. Use of morselized bone allograft  5. Arthrodesis, C5-6, C6-7, anterior interbody technique  6. Use of intraoperative microscope  SURGEON: Dr. Consuella Lose, MD  ASSISTANT: Mariana Single, PA-C  ANESTHESIA: General Endotracheal  EBL: 50 cc  SPECIMENS: None  DRAINS: None  COMPLICATIONS: None immediate  CONDITION: Hemodynamically stable to postanesthesia care unit  HISTORY: Sandra Parrish is a 73 y.o. woman initially presented to the hospital after a fall with primarily right arm and leg weakness.  MRI did demonstrate severe stenosis with spinal cord compression and intrinsic T2 signal change at C5-6 and C6-7.  With her clinical presentation and radiographic findings, surgical decompression and fusion was indicated.  The risks and benefits of the surgery were reviewed in detail with the patient and after all her questions were answered, informed consent was obtained and witnessed.  PROCEDURE IN DETAIL: The patient was brought to the operating room and transferred to the operative table. After induction of general anesthesia, the patient was positioned on the operative table in the supine position with all pressure points meticulously padded. The skin of the neck was then prepped and draped in the usual sterile fashion.  After timeout was conducted, the skin was infiltrated with local anesthetic. Skin incision was then made sharply and Bovie electrocautery was used to dissect the subcutaneous tissue until the platysma was  identified. The platysma was then divided and undermined. The sternocleidomastoid muscle was then identified and, utilizing natural fascial planes in the neck, the prevertebral fascia was identified and the carotid sheath was retracted laterally and the trachea and esophagus retracted medially. Again using fluoroscopy, the correct disc spaces were identified. Bovie electrocautery was used to dissect in the subperiosteal plane and elevate the bilateral longus coli muscles. Table mounted retractors were then placed. At this point, the microscope was draped and brought into the field, and the remainder of the case was done under the microscope using microdissecting technique.  The C6-7 disc space was incised sharply and rongeurs were use to initially complete a discectomy. The high-speed drill was then used to complete discectomy until the posterior annulus was identified and removed and the posterior longitudinal ligament was identified. Using a nerve hook, the PLL was elevated, and Kerrison rongeurs were used to remove the posterior longitudinal ligament and the ventral thecal sac was identified. Using a combination of curettes and rongeurs, complete decompression of the thecal sac and exiting nerve roots at this level was completed, and verified using micro-nerve hook.  I did note significant thickening of the posterior longitudinal ligament with a large overhanging osteophytes emanating from the C6 and the C7 vertebral bodies.  Once the decompression was completed I was is freely able to pass a small microdissector within the ventral epidural space indicating good decompression.  At this point, a 6 mm lordotic medium width interbody cage was sized and packed with morcellized bone allograft. This was then inserted and tapped into place.  Attention was then turned to the C5-6 level. In a similar fashion, discectomy was completed initially with curettes and rongeurs, and completed with the drill. The PLL was  again identified, elevated and incised. Using Kerrison rongeurs, decompression of the spinal cord and exiting roots was completed and confirmed with a dissector.  Similar to the lower level, there was a significant amount of thickening of the posterior longitudinal ligament.  In addition, there appeared to be a large posterior osteophyte emanating from the inferior margin of the C5 vertebral body causing a significant amount of compression.  This was removed using the high-speed drill and Kerrison punches.  Once decompression was completed, I was freely able to pass a ball-tipped dissector within the ventral epidural space.  A 6 mm lordotic small width interbody cage was then sized and filled with bone allograft, and tapped into place. Position of the interbody devices was then confirmed with fluoroscopy.  After placement of the intervertebral devices, the anterior cervical plate was selected, and placed across the interspaces. Using a high-speed drill, the cortex of the cervical vertebral bodies was punctured, and 15 mm screws inserted in the C5, C6, and C7 levels. Final fluoroscopic images in lateral projection were taken to confirm good hardware placement.  At this point, after all counts were verified to be correct, meticulous hemostasis was secured using a combination of bipolar electrocautery and passive hemostatics. The platysma muscle was then closed using interrupted 3-0 Vicryl sutures, and the skin was closed with a interrupted subcuticular stitch. Sterile dressings were then applied and the drapes removed.  The patient tolerated the procedure well and was extubated in the room and taken to the postanesthesia care unit in stable condition.

## 2019-09-11 NOTE — Progress Notes (Addendum)
Postop day 3.  Patient progressing well following anterior cervical decompression and fusion.  Right upper and lower extremity weakness slowly improving.  Participating with therapy.  Pain minimal.  She is afebrile.  Her vitals are stable.  Motor and sensory function are stable with some decreased grip and finger extension on the right.  Wound clean and dry.  Chest and abdomen benign.  Progressing reasonably well following anterior cervical decompression and fusion.  Continue efforts at therapy and working towards rehab transfer for further rehabilitation and convalescence.Sandra Parrish

## 2019-09-11 NOTE — Progress Notes (Signed)
Physical Therapy Treatment Patient Details Name: Sandra Parrish MRN: [id number] DOB: 1946-06-10 Today's Date: 09/11/2019    History of Present Illness 73 y.o. female s/p fall presenting with primarily subjective right arm/leg weakness confirmed on exam. 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 She underwent ACDF 09/09/19.    PT Comments    Patient seen for mobility progression. Pt is in bed upon arrival, pleasant, and eager to participate in therapy. Pt c/o soreness/discomfort between scapulae, shoulders, and bilat UE. Pt presents with bilat UE and R LE weakness and requires mod-mod A +2 for functional transfer training using RW. Continue to recommend CIR for further skilled PT services to maximize independence and safety with mobility.     Follow Up Recommendations  CIR     Equipment Recommendations  Other (comment)(TBD)    Recommendations for Other Services       Precautions / Restrictions Precautions Precautions: Fall;Cervical Required Braces or Orthoses: Cervical Brace Cervical Brace: Soft collar;At all times Restrictions Weight Bearing Restrictions: No    Mobility  Bed Mobility Overal bed mobility: Needs Assistance Bed Mobility: Supine to Sit     Supine to sit: Mod assist;+2 for safety/equipment;HOB elevated     General bed mobility comments: cues for sequencing and technique; assist to bring R LE to EOB and to elevate trunk into sitting   Transfers Overall transfer level: Needs assistance Equipment used: Rolling walker (2 wheeled) Transfers: Sit to/from [employer] Transfers Sit to Stand: From elevated surface;Mod assist Stand pivot transfers: Mod assist;+2 physical assistance;+2 safety/equipment       General transfer comment: cues for hand placement/positioning prior to stand; assistance to power up into standing and for balance and guiding RW when pivoting; pt with R knee buckling  throughout   Ambulation/Gait             General Gait Details: pt able to take pivotal steps bed to recliner   Stairs             Wheelchair Mobility    Modified Rankin (Stroke Patients Only)       Balance Overall balance assessment: Needs assistance Sitting-balance support: No upper extremity supported;Feet supported Sitting balance-Leahy Scale: Good     Standing balance support: Bilateral upper extremity supported;During functional activity Standing balance-Leahy Scale: Poor                              Cognition Arousal/Alertness: Awake/alert Behavior During Therapy: WFL for tasks assessed/performed Overall Cognitive Status: Within Functional Limits for tasks assessed                                        Exercises      General Comments        Pertinent Vitals/Pain Pain Assessment: Faces Faces Pain Scale: Hurts a little bit Pain Location: upper back, neck, and shoulders Pain Descriptors / Indicators: Sore Pain Intervention(s): Limited activity within patient's tolerance;Monitored during session;Repositioned    Home Living                      Prior Function            PT Goals (current goals can now be found in the care plan section) Progress towards PT goals: Progressing toward goals    Frequency  Min 5X/week      PT Plan Current plan remains appropriate    Co-evaluation              AM-PAC PT "6 Clicks" Mobility   Outcome Measure  Help needed turning from your back to your side while in a flat bed without using bedrails?: A Little Help needed moving from lying on your back to sitting on the side of a flat bed without using bedrails?: A Lot Help needed moving to and from a bed to a chair (including a wheelchair)?: A Lot Help needed standing up from a chair using your arms (e.g., wheelchair or bedside chair)?: A Lot Help needed to walk in hospital room?: A Lot Help needed climbing  3-5 steps with a railing? : Total 6 Click Score: 12    End of Session Equipment Utilized During Treatment: Gait belt;Cervical collar Activity Tolerance: Patient tolerated treatment well Patient left: in chair;with call bell/phone within reach Nurse Communication: Mobility status PT Visit Diagnosis: Unsteadiness on feet (R26.81);Muscle weakness (generalized) (M62.81);Pain;Other abnormalities of gait and mobility (R26.89)     Time: 3785-8850 PT Time Calculation (min) (ACUTE ONLY): 19 min  Charges:  $Gait Training: 8-22 mins                     Erline Levine, PTA Acute Rehabilitation Services Pager: [phone] Office: [phone]     Carolynne Edouard 09/11/2019, 1:34 PM

## 2019-09-11 NOTE — Progress Notes (Signed)
Occupational Therapy Treatment Patient Details Name: Sandra Parrish MRN: [id number] DOB: 02-Jul-1946 Today's Date: 09/11/2019    History of present illness 73 y.o. female s/p fall presenting with primarily subjective right arm/leg weakness confirmed on exam. 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 She underwent ACDF 09/09/19.   OT comments  Pt progressing with ADL tasks and BUE use. Pt able to hold toothpaste with RUE and squeeze toothpaste onto brush with LUE. Pt having difficulty with [facility] tasks like opening containers especially in RUE. Pt using LUE for grooming task. RUE continues to have poor fine motor coordination with her dominant hand. Pt very limited by decreased sensation and decreased FMC in BUEs. Pt continues to have numbness making it difficult to perform such tasks.  Pt unable to perform sit to stand without +2; pt attempting, but R side is very weak. Pt and OT unable to power up out of chair for chair push-up due to weakness. Pt performing light ROM exercises shoulder to 90* through digits. Pt would benefit from continued OT skilled services for ADL, mobility and HEP. OT following acutely.    Follow Up Recommendations  CIR;Supervision/Assistance - 24 hour    Equipment Recommendations  Other (comment)(to be determined)    Recommendations for Other Services      Precautions / Restrictions Precautions Precautions: Fall;Cervical Required Braces or Orthoses: Cervical Brace Cervical Brace: Soft collar;At all times Restrictions Weight Bearing Restrictions: No       Mobility Bed Mobility Overal bed mobility: Needs Assistance Bed Mobility: Supine to Sit     Supine to sit: Mod assist;+2 for safety/equipment;HOB elevated     General bed mobility comments: seated in recliner upon arrival  Transfers Overall transfer level: Needs assistance Equipment used: Rolling walker (2 wheeled) Transfers: Sit  to/from Stand Sit to Stand: From elevated surface;Mod assist Stand pivot transfers: Mod assist;+2 physical assistance;+2 safety/equipment       General transfer comment: unable to perform sit to stand without +2; pt attempting, but R side is very weak    Balance Overall balance assessment: Needs assistance Sitting-balance support: No upper extremity supported;Feet supported Sitting balance-Leahy Scale: Fair Sitting balance - Comments: pt fatigues easily in unsupported sitting   Standing balance support: Bilateral upper extremity supported;During functional activity Standing balance-Leahy Scale: Poor                             ADL either performed or assessed with clinical judgement   ADL Overall ADL's : Needs assistance/impaired Eating/Feeding: Modified independent;Sitting   Grooming: Minimal assistance;Sitting Grooming Details (indicate cue type and reason): Pt able to hold toothpaste with RUE and squeeze toothpaste onto brush with LUE. Pt having difficulty with [facility] tasks like opening containers especially in RUE.                              Functional mobility during ADLs: Moderate assistance;+2 for safety/equipment;+2 for physical assistance General ADL Comments: Pt very limited by decreased sensation and decreased FMC in BUEs. Pt continues to have numbness making it difficult to perform such tasks.      Vision   Vision Assessment?: No apparent visual deficits   Perception     Praxis      Cognition Arousal/Alertness: Awake/alert Behavior During Therapy: WFL for tasks assessed/performed Overall Cognitive Status: Within Functional Limits for tasks assessed  Exercises     Shoulder Instructions       General Comments Pt continues to feel "loopy on pain meds."    Pertinent Vitals/ Pain       Pain Assessment: Faces Faces Pain Scale: Hurts little more Pain Location: upper back,  neck, and shoulders Pain Descriptors / Indicators: Sore Pain Intervention(s): Limited activity within patient's tolerance  Home Living                                          Prior Functioning/Environment              Frequency  Min 2X/week        Progress Toward Goals  OT Goals(current goals can now be found in the care plan section)  Progress towards OT goals: Progressing toward goals  Acute Rehab OT Goals Patient Stated Goal: to go home  OT Goal Formulation: With patient Time For Goal Achievement: 09/22/19 Potential to Achieve Goals: Good ADL Goals Pt Will Perform Grooming: with set-up;standing Pt Will Perform Upper Body Dressing: with set-up;standing Pt Will Perform Lower Body Dressing: with min guard assist;with adaptive equipment;sitting/lateral leans;sit to/from stand Pt Will Transfer to Toilet: with min guard assist;ambulating;bedside commode Pt Will Perform Toileting - Clothing Manipulation and hygiene: with min guard assist;sitting/lateral leans;sit to/from stand Pt/caregiver will Perform Home Exercise Program: Increased strength;Right Upper extremity;Left upper extremity  Plan Discharge plan remains appropriate    Co-evaluation                 AM-PAC OT "6 Clicks" Daily Activity     Outcome Measure   Help from another person eating meals?: None Help from another person taking care of personal grooming?: A Little Help from another person toileting, which includes using toliet, bedpan, or urinal?: A Lot Help from another person bathing (including washing, rinsing, drying)?: A Lot Help from another person to put on and taking off regular upper body clothing?: A Little Help from another person to put on and taking off regular lower body clothing?: A Lot 6 Click Score: 16    End of Session Equipment Utilized During Treatment: Cervical collar;Rolling walker;Gait belt  OT Visit Diagnosis: Unsteadiness on feet (R26.81);Muscle  weakness (generalized) (M62.81);Pain Pain - part of body: (neck)   Activity Tolerance Patient tolerated treatment well   Patient Left in chair;with call bell/phone within reach;with chair alarm set   Nurse Communication Mobility status        Time: 0981-1914 OT Time Calculation (min): 33 min  Charges: OT General Charges $OT Visit: 1 Visit OT Treatments $Self Care/Home Management : 8-22 mins $Therapeutic Activity: 8-22 mins  Darryl Nestle) Marsa Aris OTR/L Acute Rehabilitation Services Pager: [phone] Office: [phone]    Sandra Parrish 09/11/2019, 2:21 PM

## 2019-09-12 MED ORDER — SALINE SPRAY 0.65 % NA SOLN
1.0000 | NASAL | Status: DC | PRN
  Filled 2019-09-12: qty 44

## 2019-09-12 NOTE — Progress Notes (Signed)
Patient ID: Sandra Parrish, female   DOB: 05/08/1946, 73 y.o.   MRN: [id number] BP (!) 141/63 (BP Location: Left Arm)   Pulse 75   Temp 98.1 F (36.7 C) (Oral)   Resp 20   SpO2 95%  Alert and oriented x4, speech is clear and fluent Moving extremities, weak in right upper extremity, no change Wound is clean, dry, no signs of infection Doing well

## 2019-09-13 ENCOUNTER — Inpatient Hospital Stay (HOSPITAL_COMMUNITY)
Admission: RE | Admit: 2019-09-13 | Discharge: 2019-10-08 | DRG: 949 | Disposition: A | Discharge status: Home Health | Payer: Medicare Other | Source: Intra-hospital | Attending: Physical Medicine and Rehabilitation | Admitting: Physical Medicine and Rehabilitation

## 2019-09-13 ENCOUNTER — Ambulatory Visit (HOSPITAL_COMMUNITY): Payer: Medicare Other

## 2019-09-13 ENCOUNTER — Encounter (HOSPITAL_COMMUNITY): Payer: Self-pay

## 2019-09-13 ENCOUNTER — Other Ambulatory Visit: Payer: Self-pay

## 2019-09-13 DIAGNOSIS — R338 Other retention of urine: Secondary | ICD-10-CM | POA: Diagnosis present

## 2019-09-13 DIAGNOSIS — K5909 Other constipation: Secondary | ICD-10-CM | POA: Diagnosis present

## 2019-09-13 DIAGNOSIS — Z79899 Other long term (current) drug therapy: Secondary | ICD-10-CM | POA: Diagnosis not present

## 2019-09-13 DIAGNOSIS — Z7982 Long term (current) use of aspirin: Secondary | ICD-10-CM

## 2019-09-13 DIAGNOSIS — N319 Neuromuscular dysfunction of bladder, unspecified: Secondary | ICD-10-CM | POA: Diagnosis present

## 2019-09-13 DIAGNOSIS — K592 Neurogenic bowel, not elsewhere classified: Secondary | ICD-10-CM | POA: Diagnosis present

## 2019-09-13 DIAGNOSIS — Z91040 Latex allergy status: Secondary | ICD-10-CM | POA: Diagnosis not present

## 2019-09-13 DIAGNOSIS — K5903 Drug induced constipation: Secondary | ICD-10-CM | POA: Diagnosis present

## 2019-09-13 DIAGNOSIS — R339 Retention of urine, unspecified: Secondary | ICD-10-CM | POA: Diagnosis present

## 2019-09-13 DIAGNOSIS — M7989 Other specified soft tissue disorders: Secondary | ICD-10-CM

## 2019-09-13 DIAGNOSIS — E876 Hypokalemia: Secondary | ICD-10-CM | POA: Diagnosis present

## 2019-09-13 DIAGNOSIS — G5621 Lesion of ulnar nerve, right upper limb: Secondary | ICD-10-CM | POA: Diagnosis present

## 2019-09-13 DIAGNOSIS — Z4889 Encounter for other specified surgical aftercare: Secondary | ICD-10-CM | POA: Diagnosis present

## 2019-09-13 DIAGNOSIS — Z981 Arthrodesis status: Secondary | ICD-10-CM | POA: Diagnosis not present

## 2019-09-13 DIAGNOSIS — M4802 Spinal stenosis, cervical region: Secondary | ICD-10-CM | POA: Diagnosis present

## 2019-09-13 DIAGNOSIS — R600 Localized edema: Secondary | ICD-10-CM | POA: Diagnosis present

## 2019-09-13 DIAGNOSIS — G959 Disease of spinal cord, unspecified: Secondary | ICD-10-CM | POA: Diagnosis present

## 2019-09-13 MED ORDER — SORBITOL 70 % SOLN
30.0000 mL | Freq: Every day | Status: DC | PRN
  Administered 2019-09-13 – 2019-09-23 (×2): 30 mL via ORAL
  Filled 2019-09-13 (×2): qty 30

## 2019-09-13 MED ORDER — ONDANSETRON HCL 4 MG PO TABS: 4.0000 mg | ORAL_TABLET | Freq: Four times a day (QID) | ORAL | Status: DC | PRN

## 2019-09-13 MED ORDER — ONDANSETRON HCL 4 MG/2ML IJ SOLN: 4.0000 mg | Freq: Four times a day (QID) | INTRAMUSCULAR | Status: DC | PRN

## 2019-09-13 MED ORDER — ACETAMINOPHEN 325 MG PO TABS
650.0000 mg | ORAL_TABLET | ORAL | Status: DC | PRN
  Administered 2019-09-20 – 2019-10-06 (×6): 650 mg via ORAL
  Filled 2019-09-13 (×7): qty 2

## 2019-09-13 MED ORDER — BISACODYL 10 MG RE SUPP
10.0000 mg | Freq: Every day | RECTAL | Status: DC | PRN
  Administered 2019-09-13: 10 mg via RECTAL
  Filled 2019-09-13: qty 1

## 2019-09-13 MED ORDER — POLYETHYLENE GLYCOL 3350 17 G PO PACK
17.0000 g | PACK | Freq: Every day | ORAL | Status: DC | PRN
  Administered 2019-09-13: 17 g via ORAL
  Filled 2019-09-13 (×3): qty 1

## 2019-09-13 MED ORDER — ACETAMINOPHEN 650 MG RE SUPP: 650.0000 mg | RECTAL | Status: DC | PRN

## 2019-09-13 MED ORDER — TAMSULOSIN HCL 0.4 MG PO CAPS: 0.4000 mg | ORAL_CAPSULE | Freq: Every day | ORAL | 0 refills | Status: DC

## 2019-09-13 MED ORDER — METHOCARBAMOL 500 MG PO TABS
500.0000 mg | ORAL_TABLET | Freq: Four times a day (QID) | ORAL | Status: DC | PRN
  Administered 2019-09-13 – 2019-10-06 (×6): 500 mg via ORAL
  Filled 2019-09-13 (×7): qty 1

## 2019-09-13 MED ORDER — TAMSULOSIN HCL 0.4 MG PO CAPS
0.4000 mg | ORAL_CAPSULE | Freq: Every day | ORAL | Status: DC
  Administered 2019-09-13 – 2019-09-14 (×2): 0.4 mg via ORAL
  Filled 2019-09-13 (×2): qty 1

## 2019-09-13 MED ORDER — DOCUSATE SODIUM 100 MG PO CAPS
100.0000 mg | ORAL_CAPSULE | Freq: Two times a day (BID) | ORAL | Status: DC
  Administered 2019-09-13 – 2019-10-08 (×49): 100 mg via ORAL
  Filled 2019-09-13 (×51): qty 1

## 2019-09-13 MED ORDER — SENNA 8.6 MG PO TABS
1.0000 | ORAL_TABLET | Freq: Two times a day (BID) | ORAL | Status: DC
  Administered 2019-09-13 – 2019-10-08 (×46): 8.6 mg via ORAL
  Filled 2019-09-13 (×50): qty 1

## 2019-09-13 MED ORDER — METHOCARBAMOL 1000 MG/10ML IJ SOLN: 500.0000 mg | Freq: Four times a day (QID) | INTRAVENOUS | Status: DC | PRN

## 2019-09-13 MED ORDER — OXYCODONE HCL 5 MG PO TABS
5.0000 mg | ORAL_TABLET | ORAL | Status: DC | PRN
  Administered 2019-09-13 – 2019-09-17 (×11): 10 mg via ORAL
  Filled 2019-09-13 (×16): qty 2

## 2019-09-13 NOTE — Progress Notes (Signed)
Pt admitted to room 4M12. Denies any pain or discomfort at this time. Oriented to floor, room number and rehab fall policy. No complaints at this time. Continue plan of care.  Gerald Stabs, RN

## 2019-09-13 NOTE — PMR Pre-admission (Addendum)
PMR Admission Coordinator Pre-Admission Assessment  Patient: Sandra Parrish is an 73 y.o., female MRN: [id number] DOB: 1945-11-08 Height:   Weight:                Insurance Information HMO:     PPO:      PCP:      IPA:      80/20:      OTHER:  PRIMARY: Medicare Part A only      Policy#: [id number]      Subscriber: pt Benefits:  Phone #: online     Name: 11/23 Eff. Date: 05/21/2012     Deduct: $1408      Out of Pocket Max: none      Life Max: none CIR: 100%      SNF: 20 full days Outpatient: 80%     Co-Pay: 20% Home Health: 100%      Co-Pay: none DME: 80%     Co-Pay: 20% Providers: pt choice  SECONDARY: none     Medicaid Application Date:       Case Manager:  Disability Application Date:       Case Worker:   The "Data Collection Information Summary" for patients in Inpatient Rehabilitation Facilities with attached "Privacy Act Assumption Records" was provided and verbally reviewed with: Patient  Emergency Contact Information Contact Information    Name Relation Home Work Mobile   [place] [phone]  [place], [place]   [phone]     Current Medical History  Patient Admitting Diagnosis: cervical myelopathy  History of Present Illness: 73 year old right-handed female with unremarkable past medical history.   Presented 09/07/2019 after a fall without loss of consciousness and progressive right upper extremity and right lower extremity weakness and tingling.  Admission chemistries unremarkable, SARS coronavirus negative, hemoglobin 11.6.  Cranial CT scan unremarkable for acute intracranial process.  There was a midline frontal scalp swelling contusion with small hematoma measuring 4 mm in maximal thickness.  CT/MRI cervical spine showed abnormal cord edema versus myelomalacia extending from C4-5 through the mid C7 level associated with severe central narrowing of the thecal sac at C5-6 and moderate central narrowing C6-7 with myelopathy.   Underwent discectomy at C5-6, 6-7 for decompression of spinal cord and existing nerve root placement of anterior instrumentation consisting of interbody plate and screws arthrodesis on 09/09/2019 per Dr. Kathyrn Sheriff.  Hospital course complicated by pain as well as bouts of urinary retention with initially Foley catheter tube placed maintained on Flomax.  Cervical collar when out of bed.  Tolerating a regular diet.   Past Medical History  History reviewed. No pertinent past medical history.  Family History  family history is not on file.  Prior Rehab/Hospitalizations:  Has the patient had prior rehab or hospitalizations prior to admission? Yes  Has the patient had major surgery during 100 days prior to admission? Yes  Current Medications   Current Facility-Administered Medications:  .  0.9 %  sodium chloride infusion, , Intravenous, Continuous, Costella, Vista Mink, PA-C, Stopped at 09/09/19 [phone] .  0.9 %  sodium chloride infusion, 250 mL, Intravenous, PRN, Costella, Vincent J, PA-C .  0.9 %  sodium chloride infusion, , Intravenous, Continuous, Costella, Vincent J, PA-C .  0.9 %  sodium chloride infusion, 250 mL, Intravenous, Continuous, Costella, Vincent J, PA-C .  acetaminophen (TYLENOL) tablet 650 mg, 650 mg, Oral, Q4H PRN **OR** acetaminophen (TYLENOL) suppository 650 mg, 650 mg, Rectal, Q4H PRN, Costella, Vincent J, PA-C .  bisacodyl (DULCOLAX) suppository  10 mg, 10 mg, Rectal, Daily PRN, Costella, Darci Current, PA-C .  Chlorhexidine Gluconate Cloth 2 % PADS 6 each, 6 each, Topical, Daily, Lisbeth Renshaw, MD, 6 each at 09/12/19 0854 .  docusate sodium (COLACE) capsule 100 mg, 100 mg, Oral, BID, Costella, Darci Current, PA-C, 100 mg at 09/12/19 2127 .  HYDROcodone-acetaminophen (NORCO/VICODIN) 5-325 MG per tablet 1 tablet, 1 tablet, Oral, Q4H PRN, Costella, Darci Current, PA-C .  HYDROmorphone (DILAUDID) injection 0.5-1 mg, 0.5-1 mg, Intravenous, Q2H PRN, Costella, Vincent J, PA-C, 1 mg at 09/10/19  1811 .  LORazepam (ATIVAN) injection 1 mg, 1 mg, Intravenous, Once PRN, Costella, Darci Current, PA-C .  menthol-cetylpyridinium (CEPACOL) lozenge 3 mg, 1 lozenge, Oral, PRN, 3 mg at 09/10/19 0657 **OR** phenol (CHLORASEPTIC) mouth spray 1 spray, 1 spray, Mouth/Throat, PRN, Costella, Vincent J, PA-C .  methocarbamol (ROBAXIN) tablet 500 mg, 500 mg, Oral, Q6H PRN, 500 mg at 09/12/19 1157 **OR** methocarbamol (ROBAXIN) 500 mg in dextrose 5 % 50 mL IVPB, 500 mg, Intravenous, Q6H PRN, Costella, Darci Current, PA-C, Stopped at 09/09/19 1458 .  ondansetron (ZOFRAN) tablet 4 mg, 4 mg, Oral, Q6H PRN **OR** ondansetron (ZOFRAN) injection 4 mg, 4 mg, Intravenous, Q6H PRN, Costella, Vincent J, PA-C .  oxyCODONE (Oxy IR/ROXICODONE) immediate release tablet 5 mg, 5 mg, Oral, Q4H PRN, Costella, Vincent J, PA-C, 5 mg at 09/10/19 0657 .  oxyCODONE (Oxy IR/ROXICODONE) immediate release tablet 5-10 mg, 5-10 mg, Oral, Q3H PRN, Costella, Vincent J, PA-C, 10 mg at 09/13/19 0529 .  polyethylene glycol (MIRALAX / GLYCOLAX) packet 17 g, 17 g, Oral, Daily PRN, Costella, Darci Current, PA-C .  senna (SENOKOT) tablet 8.6 mg, 1 tablet, Oral, BID, Costella, Vincent J, PA-C, 8.6 mg at 09/12/19 2127 .  senna-docusate (Senokot-S) tablet 1 tablet, 1 tablet, Oral, QHS PRN, Costella, Vincent J, PA-C .  sodium chloride (OCEAN) 0.65 % nasal spray 1 spray, 1 spray, Each Nare, PRN, Coletta Memos, MD .  sodium chloride flush (NS) 0.9 % injection 3 mL, 3 mL, Intravenous, Q12H, Costella, Vincent J, PA-C, 3 mL at 09/12/19 0855 .  sodium chloride flush (NS) 0.9 % injection 3 mL, 3 mL, Intravenous, Q12H, Costella, Vincent J, PA-C, 3 mL at 09/11/19 1052 .  sodium chloride flush (NS) 0.9 % injection 3 mL, 3 mL, Intravenous, PRN, Costella, Vincent J, PA-C .  sodium chloride flush (NS) 0.9 % injection 3 mL, 3 mL, Intravenous, Q12H, Costella, Vincent J, PA-C, 3 mL at 09/12/19 2137 .  sodium chloride flush (NS) 0.9 % injection 3 mL, 3 mL, Intravenous, PRN,  Costella, Vincent J, PA-C .  sodium phosphate (FLEET) 7-19 GM/118ML enema 1 enema, 1 enema, Rectal, Once PRN, Costella, Darci Current, PA-C .  tamsulosin (FLOMAX) capsule 0.4 mg, 0.4 mg, Oral, QPC supper, Costella, Vincent J, PA-C, 0.4 mg at 09/12/19 1732 .  zolpidem (AMBIEN) tablet 5 mg, 5 mg, Oral, QHS PRN, Costella, Darci Current, PA-C  Patients Current Diet:  Diet Order            Diet general        Diet regular Room service appropriate? Yes with Assist; Fluid consistency: Thin  Diet effective now              Precautions / Restrictions Precautions Precautions: Fall, Cervical Precaution Booklet Issued: Yes (comment) Precaution Comments: reviewed cervical precautions Cervical Brace: Soft collar, At all times Restrictions Weight Bearing Restrictions: No   Has the patient had 2 or more falls or a fall with injury in  the past year?Yes  Prior Activity Level Community (5-7x/wk): Independent and driving  Prior Functional Level Prior Function Level of Independence: Independent Comments: Pt has 2 cats. pt was cooking, cleaning, driving  Self Care: Did the patient need help bathing, dressing, using the toilet or eating?  Independent  Indoor Mobility: Did the patient need assistance with walking from room to room (with or without device)? Independent  Stairs: Did the patient need assistance with internal or external stairs (with or without device)? Independent  Functional Cognition: Did the patient need help planning regular tasks such as shopping or remembering to take medications? Independent  Home Assistive Devices / Equipment Home Equipment: None  Prior Device Use: Indicate devices/aids used by the patient prior to current illness, exacerbation or injury? None of the above  Current Functional Level Cognition  Overall Cognitive Status: Within Functional Limits for tasks assessed Orientation Level: Oriented X4    Extremity Assessment (includes Sensation/Coordination)   Upper Extremity Assessment: Overall WFL for tasks assessed RUE Coordination: decreased fine motor LUE Coordination: decreased fine motor  Lower Extremity Assessment: Generalized weakness RLE Deficits / Details: decreased strength RLE > LLE LLE Deficits / Details: grossly 4/5, numbness/tingling reported bilat thighs    ADLs  Overall ADL's : Needs assistance/impaired Eating/Feeding: Modified independent, Sitting Grooming: Minimal assistance, Sitting Grooming Details (indicate cue type and reason): Pt able to hold toothpaste with RUE and squeeze toothpaste onto brush with LUE. Pt having difficulty with [facility] tasks like opening containers especially in RUE.  Upper Body Bathing: Set up, Bed level Lower Body Bathing: Maximal assistance, Bed level Upper Body Dressing : Set up, Bed level Lower Body Dressing: Maximal assistance, Bed level Toileting- Clothing Manipulation and Hygiene: Maximal assistance, Bed level Functional mobility during ADLs: Moderate assistance, +2 for safety/equipment, +2 for physical assistance General ADL Comments: Pt very limited by decreased sensation and decreased FMC in BUEs. Pt continues to have numbness making it difficult to perform such tasks.     Mobility  Overal bed mobility: Needs Assistance Bed Mobility: Supine to Sit Rolling: Min assist Sidelying to sit: Mod assist, HOB elevated Supine to sit: Mod assist, +2 for safety/equipment, HOB elevated Sit to supine: Min assist Sit to sidelying: Min assist General bed mobility comments: seated in recliner upon arrival    Transfers  Overall transfer level: Needs assistance Equipment used: Rolling walker (2 wheeled) Transfers: Sit to/from Stand Sit to Stand: From elevated surface, Mod assist Stand pivot transfers: Mod assist, +2 physical assistance, +2 safety/equipment  Lateral/Scoot Transfers: Mod assist, Max assist General transfer comment: unable to perform sit to stand without +2; pt attempting, but R side is  very weak    Ambulation / Gait / Stairs / Wheelchair Mobility  Ambulation/Gait General Gait Details: pt able to take pivotal steps bed to recliner    Posture / Balance Dynamic Sitting Balance Sitting balance - Comments: pt fatigues easily in unsupported sitting Balance Overall balance assessment: Needs assistance Sitting-balance support: No upper extremity supported, Feet supported Sitting balance-Leahy Scale: Fair Sitting balance - Comments: pt fatigues easily in unsupported sitting Standing balance support: Bilateral upper extremity supported, During functional activity Standing balance-Leahy Scale: Poor Standing balance comment: scooting for transfer    Special needs/care consideration BiPAP/CPAP CPM Continuous Drip IV Dialysis        Days Life Vest Oxygen Special Bed Trach Size Wound Vac (area)      Location Skin  Location Bowel mgmt: no LBM documented Bladder mgmt: #16 french placed 11/20 due to urine retention Diabetic mgmt Behavioral consideration  Chemo/radiation  Designated visitor TBD between her two friend Amy and Florentina Addison   Previous Home Environment  Living Arrangements: Alone  Lives With: Alone Available Help at Discharge: Friend(s), Available PRN/intermittently Type of Home: Apartment Home Layout: One level Home Access: Stairs to enter Entrance Stairs-Rails: Right Secretary/administrator of Steps: 6+8 Bathroom Shower/Tub: Hydrographic surveyor, Engineer, building services: Pharmacist, community: Yes How Accessible: Accessible via walker Home Care Services: No  Discharge Living Setting Plans for Discharge Living Setting: Alone, Apartment Type of Home at Discharge: Apartment Discharge Home Layout: One level Discharge Home Access: Stairs to enter Entrance Stairs-Rails: Right Entrance Stairs-Number of Steps: 6+8 Discharge Bathroom Shower/Tub: Tub/shower unit, Curtain Discharge Bathroom Toilet: Standard Discharge Bathroom  Accessibility: Yes How Accessible: Accessible via walker Does the patient have any problems obtaining your medications?: No  Social/Family/Support Systems Contact Information: friend AMy and Katie Anticipated Caregiver: friends PRN  Anticipated Industrial/product designer Information: see above Caregiver Availability: Intermittent Discharge Plan Discussed with Primary Caregiver: Yes Is Caregiver In Agreement with Plan?: Yes Does Caregiver/Family have Issues with Lodging/Transportation while Pt is in Rehab?: No   Goals/Additional Needs Patient/Family Goal for Rehab: Mod I to supervision PT, Mod I to min with OT Expected length of stay: ELOS 10 to 14 days Pt/Family Agrees to Admission and willing to participate: Yes Program Orientation Provided & Reviewed with Pt/Caregiver Including Roles  & Responsibilities: Yes  Barriers to Discharge: Decreased caregiver support   Decrease burden of Care through IP rehab admission: n/a  Possible need for SNF placement upon discharge: hopeful to reach level for intermittent assist only   Patient Condition: This patient's medical and functional status has changed since the consult dated: 09/10/2019 in which the Rehabilitation Physician determined and documented that the patient's condition is appropriate for intensive rehabilitative care in an inpatient rehabilitation facility. See "History of Present Illness" (above) for medical update. Functional changes are: mod assist. Patient's medical and functional status update has been discussed with the Rehabilitation physician and patient remains appropriate for inpatient rehabilitation. Will admit to inpatient rehab today.  Preadmission Screen Completed By:  Clois Dupes, RN, 09/13/2019 10:51 AM ______________________________________________________________________   Discussed status with Dr. Wynn Banker on 09/13/2019 at  1055 and received approval for admission today.  Admission Coordinator:  Clois Dupes, time 0981 Date 09/13/2019

## 2019-09-13 NOTE — TOC Transition Note (Signed)
Transition of Care [facility]) - CM/SW Discharge Note Marvetta Gibbons RN,BSN Transitions of Care Unit 4NP (non trauma) - RN Case Manager [phone]   Patient Details  Name: Sandra Parrish MRN: [id number] Date of Birth: 05/11/1946  Transition of Care [facility]) CM/SW Contact:  Dawayne Patricia, RN Phone Number: 09/13/2019, 1:57 PM   Clinical Narrative:    Pt s/p fall at home, and post op ACDF, CIR consulted and can admit pt today per [facility] with inpt rehab. Pt is stable for transition to rehab- and plan will be to transition to [place] rehab.    Final next level of care: IP Rehab Facility Barriers to Discharge: Barriers Resolved   Patient Goals and CMS Choice Patient states their goals for this hospitalization and ongoing recovery are:: wants rehab CMS Medicare.gov Compare Post Acute Care list provided to:: Patient Choice offered to / list presented to : Patient  Discharge Placement        [facility] [facility]               Discharge Plan and Services In-house Referral: Clinical Social Work Discharge Planning Services: CM Consult Post Acute Care Choice: [place]          DME Arranged: N/A DME Agency: NA       HH Arranged: NA HH Agency: NA        Social Determinants of Health (SDOH) Interventions     Readmission Risk Interventions No flowsheet data found.

## 2019-09-13 NOTE — Progress Notes (Signed)
Lower extremity venous has been completed.   Preliminary results in CV Proc.   Abram Sander 09/13/2019 3:44 PM

## 2019-09-13 NOTE — H&P (Signed)
Physical Medicine and Rehabilitation Admission H&P       HPI: Sandra Parrish is a 73 year old right-handed female with unremarkable past medical history.  Per chart review and patient, patient lives alone.  She was independent prior to admission.  She does not have any family in the area but does have a neighbor and some friends.  1 level home 6 steps to entry.  Presented 09/07/2019 after a fall without loss of consciousness and progressive right upper extremity and right lower extremity weakness and tingling.  Admission chemistries unremarkable, SARS coronavirus negative, hemoglobin 11.6.  Cranial CT scan unremarkable for acute intracranial process.  There was a midline frontal scalp swelling contusion with small hematoma measuring 4 mm in maximal thickness.  CT/MRI cervical spine showed abnormal cord edema versus myelomalacia extending from C4-5 through the mid C7 level associated with severe central narrowing of the thecal sac at C5-6 and moderate central narrowing C6-7 with myelopathy.  Underwent discectomy at C5-6, 6-7 for decompression of spinal cord and existing nerve root placement of anterior instrumentation consisting of interbody plate and screws arthrodesis on 09/09/2019 per Dr. Kathyrn Sheriff.  Hospital course complicated by pain as well as bouts of urinary retention with initially Foley catheter tube placed maintained on Flomax.  Cervical collar when out of bed.  Tolerating a regular diet.  Therapy evaluations completed and patient was admitted for a comprehensive rehab program.   Review of Systems  Constitutional: Negative for fever.  HENT: Negative for hearing loss.   Eyes: Negative for blurred vision and double vision.  Respiratory: Negative for cough and shortness of breath.   Cardiovascular: Negative for chest pain, palpitations and leg swelling.  Gastrointestinal: Positive for constipation. Negative for heartburn, nausea and vomiting.  Genitourinary: Negative for dysuria, flank pain and  hematuria.  Musculoskeletal: Positive for falls.  Skin: Negative for rash.  Neurological: Positive for dizziness, tingling, focal weakness and weakness.  All other systems reviewed and are negative.   History reviewed. No pertinent past medical history.      Past Surgical History:  Procedure Laterality Date  . ANTERIOR CERVICAL DECOMP/DISCECTOMY FUSION N/A 09/09/2019    Procedure: ANTERIOR CERVICAL DECOMPRESSION/DISCECTOMY FUSION CERVICAL FIVE- CERVICAL SIX, CERVICAL SIX- CERVICAL SEVEN;  Surgeon: Consuella Lose, MD;  Location: [place];  Service: Neurosurgery;  Laterality: N/A;  ANTERIOR CERVICAL DECOMPRESSION/DISCECTOMY FUSION CERVICAL FIVE- CERVICAL SIX, CERVICAL SIX- CERVICAL SEVEN    History reviewed. No pertinent family history. Social History:  reports that she has never smoked. She has never used smokeless tobacco. No history on file for alcohol and drug. Allergies:      Allergies  Allergen Reactions  . Latex Itching          Medications Prior to Admission  Medication Sig Dispense Refill  . aspirin EC 325 MG tablet Take 650 mg by mouth daily as needed (pain/headache).      Marland Kitchen HYDROcodone-acetaminophen (NORCO/VICODIN) 5-325 MG tablet Take 1 tablet by mouth every 6 (six) hours as needed. 8 tablet 0      Drug Regimen Review  Drug regimen was reviewed and remains appropriate with no significant issues identified   Home: Home Living Family/patient expects to be discharged to:: Private residence Living Arrangements: Alone Available Help at Discharge: Friend(s), Available PRN/intermittently Type of Home: Apartment Home Access: Stairs to enter [employer] of Steps: 6+8 Entrance Stairs-Rails: Right Home Layout: One level Bathroom Shower/Tub: Chiropodist: Standard Home Equipment: None   Functional History: Prior Function Level of Independence: Independent Comments: Pt has  2 cats. pt was cooking, cleaning, driving   Functional Status:   Mobility: Bed Mobility Overal bed mobility: Needs Assistance Bed Mobility: Supine to Sit Rolling: Min assist Sidelying to sit: Mod assist, HOB elevated Supine to sit: Mod assist, +2 for safety/equipment, HOB elevated Sit to supine: Min assist Sit to sidelying: Min assist General bed mobility comments: seated in recliner upon arrival Transfers Overall transfer level: Needs assistance Equipment used: Rolling walker (2 wheeled) Transfers: Sit to/from Stand Sit to Stand: From elevated surface, Mod assist Stand pivot transfers: Mod assist, +2 physical assistance, +2 safety/equipment  Lateral/Scoot Transfers: Mod assist, Max assist General transfer comment: unable to perform sit to stand without +2; pt attempting, but R side is very weak Ambulation/Gait General Gait Details: pt able to take pivotal steps bed to recliner   ADL: ADL Overall ADL's : Needs assistance/impaired Eating/Feeding: Modified independent, Sitting Grooming: Minimal assistance, Sitting Grooming Details (indicate cue type and reason): Pt able to hold toothpaste with RUE and squeeze toothpaste onto brush with LUE. Pt having difficulty with [facility] tasks like opening containers especially in RUE.  Upper Body Bathing: Set up, Bed level Lower Body Bathing: Maximal assistance, Bed level Upper Body Dressing : Set up, Bed level Lower Body Dressing: Maximal assistance, Bed level Toileting- Clothing Manipulation and Hygiene: Maximal assistance, Bed level Functional mobility during ADLs: Moderate assistance, +2 for safety/equipment, +2 for physical assistance General ADL Comments: Pt very limited by decreased sensation and decreased FMC in BUEs. Pt continues to have numbness making it difficult to perform such tasks.    Cognition: Cognition Overall Cognitive Status: Within Functional Limits for tasks assessed Orientation Level: Oriented X4 Cognition Arousal/Alertness: Awake/alert Behavior During Therapy: WFL for tasks  assessed/performed Overall Cognitive Status: Within Functional Limits for tasks assessed   Physical Exam: Blood pressure 109/75, pulse 75, temperature 97.6 F (36.4 C), temperature source Oral, resp. rate 16, SpO2 95 %. Physical Exam  Neurological:  Alert sitting up in chair.  Makes good eye contact with examiner.  Follows commands.  She is oriented x3.  Fair insight and awareness of her deficits.    General: No acute distress Mood and affect are appropriate Heart: Regular rate and rhythm no rubs murmurs or extra sounds Lungs: Clear to auscultation, breathing unlabored, no rales or wheezes Abdomen: Positive bowel sounds, soft nontender to palpation, nondistended Extremities: No clubbing, cyanosis, or edema Skin: No evidence of breakdown, no evidence of rash, honeycomb dressing over the right anterior lower cervical incision. Neurologic: Alert and oriented x3, motor strength is 3 - in the right deltoid 3 - in the biceps triceps finger flexors and extensors Left upper extremity is 4 - at the deltoid bicep tricep finger flexors and extensors 4/5 in the hip flexors knee extensors ankle dorsiflexors plantar flexors bilaterally  lower extremities Fine motor is reduced bilateral hands with finger to thumb opposition. Musculoskeletal: Good passive range of motion in shoulders and hips as well as knees and ankles. Neck range of motion not tested.    Lab Results Last 48 Hours  No results found for this or any previous visit (from the past 48 hour(s)).   Imaging Results (Last 48 hours)  No results found.           Medical Problem List and Plan: 1.  Right upper and lower extremity weakness secondary to cervical myelopathy.S/P discectomy C5-6, 6-7 decompression placement of anterior instrumentation interbody plate and screws arthrodesis 09/09/2019.  Cervical collar when out of bed             -  patient may shower             -ELOS/Goals: 10-14d, supervision with mobility, min assist  showering and lower extremity dressing mod I/supervision upper limb ADL 2.  Antithrombotics: -DVT/anticoagulation: SCDs.  Check vascular study             -antiplatelet therapy: N/A 3. Pain Management: Oxycodone and Robaxin as needed 4. Mood: Provide emotional support             -antipsychotic agents: N/A 5. Neuropsych: This patient is capable of making decisions on her own behalf. 6. Skin/Wound Care: Routine skin checks 7. Fluids/Electrolytes/Nutrition: Routine in and outs with follow-up chemistries 8.  Urinary retention.  Flomax 0.4 mg daily.  Check PVR 9.  Constipation.  Laxative assistance     "I have personally performed a face to face diagnostic evaluation of this patient.  Additionally, I have reviewed and concur with the physician assistant's documentation above."   Charlton Amoraniel J Angiulli, PA-C  Erick ColaceAndrew E. Kirsteins M.D. [facility] FAAPM&R (Sports Med, Neuromuscular Med) Diplomate Am Board of Electrodiagnostic Med  09/13/2019

## 2019-09-13 NOTE — Discharge Summary (Signed)
Physician Discharge Summary  Patient ID: Sandra Parrish MRN: [id number] DOB/AGE: 07-07-1946 73 y.o.  Admit date: 09/07/2019 Discharge date: 09/13/2019  Admission Diagnoses:  Cervical stenosis Neurogenic bladder  Discharge Diagnoses:  Same Active Problems:   Stenosis of cervical spine with myelopathy (HCC)   Cervical spinal stenosis   Bradycardia   Acute blood loss anemia   Urinary retention   Postoperative pain   Drug induced constipation   Discharged Condition: Stable  Hospital Course:  Sandra Parrish is a 73 y.o. female Who presented to the emergency room on 09/07/2019 for progressive right upper extremity and right lower extremity weakness after a fall the  Previous day.  She underwent an MRI of her cervical spine by the emergency room physician.  This revealed severe cervical stenosis at C5-6 and to a lesser extent C6-7 with associated cord signal changes.  Neurosurgery was called.  Given the degree of stenosis and right hemiparesis, it was recommended she undergo cervical decompression at C5-6 and C6-7.  She was admitted and underwent surgery on 09/09/2019. Prior to surgery she did have issues with urinary retention.  She did require Foley placement.  She was started on Flomax.  She otherwise had an uncomplicated hospital course.  She was discharged to inpatient rehab on 09/13/2019.  She will follow-up in 3 weeks for routine follow-up.  Treatments: Surgery - C5-6 and C6-7 ACDF  Discharge Exam: Blood pressure 119/60, pulse 73, temperature 97.7 F (36.5 C), temperature source Oral, resp. rate 19, SpO2 95 %. Awake, alert, oriented  Speech fluent, appropriate  CN grossly intact 5/5 right deltoid, bicep,4/5 right tricep, WR, 2+/5 grip 5/5 LUE 3/5 right hip flexion, 3/5 quad, 5/5 PF/DF 5/5 LLE Incision: bruising, dried blood on bandage  Disposition: Discharge disposition: [facility] Not Defined       Discharge Instructions    Call MD for:   difficulty breathing, headache or visual disturbances   Complete by: As directed    Call MD for:  persistant dizziness or light-headedness   Complete by: As directed    Call MD for:  redness, tenderness, or signs of infection (pain, swelling, redness, odor or green/yellow discharge around incision site)   Complete by: As directed    Call MD for:  severe uncontrolled pain   Complete by: As directed    Call MD for:  temperature >100.4   Complete by: As directed    Diet general   Complete by: As directed    Driving Restrictions   Complete by: As directed    Do not drive until given clearance.   Increase activity slowly   Complete by: As directed    Lifting restrictions   Complete by: As directed    Do not lift anything >10lbs. Avoid bending and twisting in awkward positions. Avoid bending at the back.   May shower / Bathe   Complete by: As directed    In 24 hours. Okay to wash wound with warm soapy water. Avoid scrubbing the wound. Pat dry.   Remove dressing in 24 hours   Complete by: As directed      Allergies as of 09/13/2019      Reactions   Latex Itching      Medication List    STOP taking these medications   aspirin EC 325 MG tablet   HYDROcodone-acetaminophen 5-325 MG tablet Commonly known as: NORCO/VICODIN     TAKE these medications   tamsulosin 0.4 MG Caps capsule Commonly known as: FLOMAX Take 1 capsule (  0.4 mg total) by mouth daily after supper.      Follow-up Information    Consuella Lose, MD. Schedule an appointment as soon as possible for a visit in 3 week(s).   Specialty: Neurosurgery Contact information: [address] [place] [address] [place] [address] [phone]           Signed: Traci Sermon 09/13/2019, 10:39 AM

## 2019-09-13 NOTE — Progress Notes (Signed)
Inpatient Rehabilitation Admissions Coordinator  I met with patient at bedside and contacted her friend, Katie, by phone to discuss CIR goals and expectations . Both are in agreement to admit today. I contacted Vinnie, PA, RN CM and SW to make everyone aware of plans for CIR admit. I will make the arrangements today.  Barbara Boyette, RN, MSN Rehab Admissions Coordinator [phone] 09/13/2019 10:23 AM  

## 2019-09-13 NOTE — Progress Notes (Signed)
  NEUROSURGERY PROGRESS NOTE   No issues overnight.  Multiple concerns today, mostly about rehab  EXAM:  BP 119/60 (BP Location: Left Arm)   Pulse 73   Temp 97.7 F (36.5 C) (Oral)   Resp 19   SpO2 95%   Awake, alert, oriented  Speech fluent, appropriate  CN grossly intact  5/5 right deltoid, bicep, 4/5 right tricep, WR, 2+/5 grip 5/5 LUE 3/5 right hip flexion, 3/5 quad, 5/5 PF/DF 5/5 LLE Incision: bruising, dried blood on bandage  IMPRESSION/PLAN 73 y.o. female pod #4 C5-6 and C6-7 ACDF for cervical stenosis with myelopathy. Grossly stable neurologically with right hemiparesis.   - Urinary retention: trial catheter removal. Continue flomax. If unable to void, will replace catheter - patient more receptive to CIR. Stable when insurance approved - supportive care

## 2019-09-13 NOTE — Progress Notes (Signed)
Charlett Blake, MD  Physician  Physical Medicine and Rehabilitation  PMR Pre-admission  Addendum  Date of Service:  09/13/2019 10:44 AM      Related encounter: ED to Hosp-Admission (Discharged) from 09/07/2019 in [place]         Show:Clear all [x] Manual[x] Template[x] Copied  Added by: [x] Cristina Gong, RN  [] Hover for details PMR Admission Coordinator Pre-Admission Assessment  Patient: Sandra Parrish is an 73 y.o., female MRN: [id number] DOB: 11-24-1945 Height:   Weight:                                                                                                                                                    Insurance Information HMO:     PPO:      PCP:      IPA:      80/20:      OTHER:  PRIMARY: Medicare Part A only      Policy#: [id number]      Subscriber: pt Benefits:  Phone #: online     Name: 11/23 Eff. Date: 05/21/2012     Deduct: $1408      Out of Pocket Max: none      Life Max: none CIR: 100%      SNF: 20 full days Outpatient: 80%     Co-Pay: 20% Home Health: 100%      Co-Pay: none DME: 80%     Co-Pay: 20% Providers: pt choice  SECONDARY: none     Medicaid Application Date:       Case Manager:  Disability Application Date:       Case Worker:   The "Data Collection Information Summary" for patients in Inpatient Rehabilitation Facilities with attached "Privacy Act [place] Records" was provided and verbally reviewed with: Patient  Emergency Contact Information         Contact Information    Name Relation Home Work Mobile   [place] [phone]  [place], [place]   [phone]     Current Medical History  Patient Admitting Diagnosis: cervical myelopathy  History of Present Illness: 73 year old right-handed female with unremarkable past medical history. Presented 09/07/2019 after a fall without loss of consciousness and progressive right upper extremity  and right lower extremity weakness and tingling. Admission chemistries unremarkable, SARS coronavirus negative, hemoglobin 11.6. Cranial CT scan unremarkable for acute intracranial process. There was a midline frontal scalp swelling contusion with small hematoma measuring 4 mm in maximal thickness. CT/MRI cervical spine showed abnormal cord edema versus myelomalacia extending from C4-5 through the mid C7 level associated with severe central narrowing of the thecal sac at C5-6 and moderate central narrowing C6-7 with myelopathy. Underwent discectomy at C5-6, 6-7 for decompression of spinal cord and existing nerve root placement of anterior instrumentation consisting of interbody plate and screws arthrodesis  on 09/09/2019 per Dr. Conchita Paris. Hospital course complicated by pain as well as bouts of urinary retention with initially Foley catheter tube placed maintained on Flomax. Cervical collar when out of bed. Tolerating a regular diet.   Past Medical History  History reviewed. No pertinent past medical history.  Family History  family history is not on file.  Prior Rehab/Hospitalizations:  Has the patient had prior rehab or hospitalizations prior to admission? Yes  Has the patient had major surgery during 100 days prior to admission? Yes  Current Medications   Current Facility-Administered Medications:  .  0.9 %  sodium chloride infusion, , Intravenous, Continuous, Costella, Darci Current, PA-C, Stopped at 09/09/19 [phone] .  0.9 %  sodium chloride infusion, 250 mL, Intravenous, PRN, Costella, Vincent J, PA-C .  0.9 %  sodium chloride infusion, , Intravenous, Continuous, Costella, Vincent J, PA-C .  0.9 %  sodium chloride infusion, 250 mL, Intravenous, Continuous, Costella, Vincent J, PA-C .  acetaminophen (TYLENOL) tablet 650 mg, 650 mg, Oral, Q4H PRN **OR** acetaminophen (TYLENOL) suppository 650 mg, 650 mg, Rectal, Q4H PRN, Costella, Vincent J, PA-C .  bisacodyl (DULCOLAX) suppository 10  mg, 10 mg, Rectal, Daily PRN, Costella, Darci Current, PA-C .  Chlorhexidine Gluconate Cloth 2 % PADS 6 each, 6 each, Topical, Daily, Lisbeth Renshaw, MD, 6 each at 09/12/19 0854 .  docusate sodium (COLACE) capsule 100 mg, 100 mg, Oral, BID, Costella, Darci Current, PA-C, 100 mg at 09/12/19 2127 .  HYDROcodone-acetaminophen (NORCO/VICODIN) 5-325 MG per tablet 1 tablet, 1 tablet, Oral, Q4H PRN, Costella, Darci Current, PA-C .  HYDROmorphone (DILAUDID) injection 0.5-1 mg, 0.5-1 mg, Intravenous, Q2H PRN, Costella, Vincent J, PA-C, 1 mg at 09/10/19 1811 .  LORazepam (ATIVAN) injection 1 mg, 1 mg, Intravenous, Once PRN, Costella, Darci Current, PA-C .  menthol-cetylpyridinium (CEPACOL) lozenge 3 mg, 1 lozenge, Oral, PRN, 3 mg at 09/10/19 0657 **OR** phenol (CHLORASEPTIC) mouth spray 1 spray, 1 spray, Mouth/Throat, PRN, Costella, Vincent J, PA-C .  methocarbamol (ROBAXIN) tablet 500 mg, 500 mg, Oral, Q6H PRN, 500 mg at 09/12/19 1157 **OR** methocarbamol (ROBAXIN) 500 mg in dextrose 5 % 50 mL IVPB, 500 mg, Intravenous, Q6H PRN, Costella, Darci Current, PA-C, Stopped at 09/09/19 1458 .  ondansetron (ZOFRAN) tablet 4 mg, 4 mg, Oral, Q6H PRN **OR** ondansetron (ZOFRAN) injection 4 mg, 4 mg, Intravenous, Q6H PRN, Costella, Vincent J, PA-C .  oxyCODONE (Oxy IR/ROXICODONE) immediate release tablet 5 mg, 5 mg, Oral, Q4H PRN, Costella, Vincent J, PA-C, 5 mg at 09/10/19 0657 .  oxyCODONE (Oxy IR/ROXICODONE) immediate release tablet 5-10 mg, 5-10 mg, Oral, Q3H PRN, Costella, Vincent J, PA-C, 10 mg at 09/13/19 0529 .  polyethylene glycol (MIRALAX / GLYCOLAX) packet 17 g, 17 g, Oral, Daily PRN, Costella, Darci Current, PA-C .  senna (SENOKOT) tablet 8.6 mg, 1 tablet, Oral, BID, Costella, Vincent J, PA-C, 8.6 mg at 09/12/19 2127 .  senna-docusate (Senokot-S) tablet 1 tablet, 1 tablet, Oral, QHS PRN, Costella, Vincent J, PA-C .  sodium chloride (OCEAN) 0.65 % nasal spray 1 spray, 1 spray, Each Nare, PRN, Coletta Memos, MD .  sodium chloride  flush (NS) 0.9 % injection 3 mL, 3 mL, Intravenous, Q12H, Costella, Vincent J, PA-C, 3 mL at 09/12/19 0855 .  sodium chloride flush (NS) 0.9 % injection 3 mL, 3 mL, Intravenous, Q12H, Costella, Vincent J, PA-C, 3 mL at 09/11/19 1052 .  sodium chloride flush (NS) 0.9 % injection 3 mL, 3 mL, Intravenous, PRN, Costella, Vincent J, PA-C .  sodium  chloride flush (NS) 0.9 % injection 3 mL, 3 mL, Intravenous, Q12H, Costella, Vincent J, PA-C, 3 mL at 09/12/19 2137 .  sodium chloride flush (NS) 0.9 % injection 3 mL, 3 mL, Intravenous, PRN, Costella, Vincent J, PA-C .  sodium phosphate (FLEET) 7-19 GM/118ML enema 1 enema, 1 enema, Rectal, Once PRN, Costella, Darci Current, PA-C .  tamsulosin (FLOMAX) capsule 0.4 mg, 0.4 mg, Oral, QPC supper, Costella, Vincent J, PA-C, 0.4 mg at 09/12/19 1732 .  zolpidem (AMBIEN) tablet 5 mg, 5 mg, Oral, QHS PRN, Costella, Darci Current, PA-C  Patients Current Diet:     Diet Order                  Diet general         Diet regular Room service appropriate? Yes with Assist; Fluid consistency: Thin  Diet effective now               Precautions / Restrictions Precautions Precautions: Fall, Cervical Precaution Booklet Issued: Yes (comment) Precaution Comments: reviewed cervical precautions Cervical Brace: Soft collar, At all times Restrictions Weight Bearing Restrictions: No   Has the patient had 2 or more falls or a fall with injury in the past year?Yes  Prior Activity Level Community (5-7x/wk): Independent and driving  Prior Functional Level Prior Function Level of Independence: Independent Comments: Pt has 2 cats. pt was cooking, cleaning, driving  Self Care: Did the patient need help bathing, dressing, using the toilet or eating?  Independent  Indoor Mobility: Did the patient need assistance with walking from room to room (with or without device)? Independent  Stairs: Did the patient need assistance with internal or external stairs (with  or without device)? Independent  Functional Cognition: Did the patient need help planning regular tasks such as shopping or remembering to take medications? Independent  Home Assistive Devices / Equipment Home Equipment: None  Prior Device Use: Indicate devices/aids used by the patient prior to current illness, exacerbation or injury? None of the above  Current Functional Level Cognition  Overall Cognitive Status: Within Functional Limits for tasks assessed Orientation Level: Oriented X4    Extremity Assessment (includes Sensation/Coordination)  Upper Extremity Assessment: Overall WFL for tasks assessed RUE Coordination: decreased fine motor LUE Coordination: decreased fine motor  Lower Extremity Assessment: Generalized weakness RLE Deficits / Details: decreased strength RLE > LLE LLE Deficits / Details: grossly 4/5, numbness/tingling reported bilat thighs    ADLs  Overall ADL's : Needs assistance/impaired Eating/Feeding: Modified independent, Sitting Grooming: Minimal assistance, Sitting Grooming Details (indicate cue type and reason): Pt able to hold toothpaste with RUE and squeeze toothpaste onto brush with LUE. Pt having difficulty with [facility] tasks like opening containers especially in RUE.  Upper Body Bathing: Set up, Bed level Lower Body Bathing: Maximal assistance, Bed level Upper Body Dressing : Set up, Bed level Lower Body Dressing: Maximal assistance, Bed level Toileting- Clothing Manipulation and Hygiene: Maximal assistance, Bed level Functional mobility during ADLs: Moderate assistance, +2 for safety/equipment, +2 for physical assistance General ADL Comments: Pt very limited by decreased sensation and decreased FMC in BUEs. Pt continues to have numbness making it difficult to perform such tasks.     Mobility  Overal bed mobility: Needs Assistance Bed Mobility: Supine to Sit Rolling: Min assist Sidelying to sit: Mod assist, HOB elevated Supine to sit: Mod  assist, +2 for safety/equipment, HOB elevated Sit to supine: Min assist Sit to sidelying: Min assist General bed mobility comments: seated in recliner upon arrival  Transfers  Overall transfer level: Needs assistance Equipment used: Rolling walker (2 wheeled) Transfers: Sit to/from Stand Sit to Stand: From elevated surface, Mod assist Stand pivot transfers: Mod assist, +2 physical assistance, +2 safety/equipment  Lateral/Scoot Transfers: Mod assist, Max assist General transfer comment: unable to perform sit to stand without +2; pt attempting, but R side is very weak    Ambulation / Gait / Stairs / Wheelchair Mobility  Ambulation/Gait General Gait Details: pt able to take pivotal steps bed to recliner    Posture / Balance Dynamic Sitting Balance Sitting balance - Comments: pt fatigues easily in unsupported sitting Balance Overall balance assessment: Needs assistance Sitting-balance support: No upper extremity supported, Feet supported Sitting balance-Leahy Scale: Fair Sitting balance - Comments: pt fatigues easily in unsupported sitting Standing balance support: Bilateral upper extremity supported, During functional activity Standing balance-Leahy Scale: Poor Standing balance comment: scooting for transfer    Special needs/care consideration BiPAP/CPAP CPM Continuous Drip IV Dialysis        Days Life Vest Oxygen Special Bed Trach Size Wound Vac (area)      Location Skin                              Location Bowel mgmt: no LBM documented Bladder mgmt: #16 french placed 11/20 due to urine retention Diabetic mgmt Behavioral consideration  Chemo/radiation  Designated visitor TBD between her two friend Amy and Psychiatric nurseKatie   Previous Home Environment  Living Arrangements: Alone  Lives With: Alone Available Help at Discharge: Friend(s), Available PRN/intermittently Type of Home: Apartment Home Layout: One level Home Access: Stairs to enter Entrance Stairs-Rails: Right  Entrance Stairs-Number of Steps: 6+8 Bathroom Shower/Tub: Hydrographic surveyorTub/shower unit, Engineer, building servicesCurtain Bathroom Toilet: Standard Bathroom Accessibility: Yes How Accessible: Accessible via walker Home Care Services: No  Discharge Living Setting Plans for Discharge Living Setting: Alone, Apartment Type of Home at Discharge: Apartment Discharge Home Layout: One level Discharge Home Access: Stairs to enter Entrance Stairs-Rails: Right Entrance Stairs-Number of Steps: 6+8 Discharge Bathroom Shower/Tub: Tub/shower unit, Curtain Discharge Bathroom Toilet: Standard Discharge Bathroom Accessibility: Yes How Accessible: Accessible via walker Does the patient have any problems obtaining your medications?: No  Social/Family/Support Systems Contact Information: friend AMy and Katie Anticipated Caregiver: friends PRN  Anticipated Caregiver's Contact Information: see above Caregiver Availability: Intermittent Discharge Plan Discussed with Primary Caregiver: Yes Is Caregiver In Agreement with Plan?: Yes Does Caregiver/Family have Issues with Lodging/Transportation while Pt is in Rehab?: No   Goals/Additional Needs Patient/Family Goal for Rehab: Mod I to supervision PT, Mod I to min with OT Expected length of stay: ELOS 10 to 14 days Pt/Family Agrees to Admission and willing to participate: Yes Program Orientation Provided & Reviewed with Pt/Caregiver Including Roles  & Responsibilities: Yes  Barriers to Discharge: Decreased caregiver support   Decrease burden of Care through IP rehab admission: n/a  Possible need for SNF placement upon discharge: hopeful to reach level for intermittent assist only   Patient Condition: This patient's medical and functional status has changed since the consult dated: 09/10/2019 in which the Rehabilitation Physician determined and documented that the patient's condition is appropriate for intensive rehabilitative care in an inpatient rehabilitation facility. See  "History of Present Illness" (above) for medical update. Functional changes are: mod assist. Patient's medical and functional status update has been discussed with the Rehabilitation physician and patient remains appropriate for inpatient rehabilitation. Will admit to inpatient rehab today.  Preadmission Screen Completed By:  Beckie SaltsBoyette,  Foye Spurling, RN, 09/13/2019 10:51 AM ______________________________________________________________________   Discussed status with Dr. Wynn Banker on 09/13/2019 at  1055 and received approval for admission today.  Admission Coordinator:  Clois Dupes, time 8921 Date 09/13/2019     Revision History

## 2019-09-13 NOTE — Progress Notes (Signed)
  NEUROSURGERY PROGRESS NOTE   Patient kindly asked me to call her friend and advocate, Joycelyn Schmid, and explain to her her diagnosis, prognosis, etc..  Ms. Sandra Parrish agrees with plan for CIR, which patient also agrees to.  We will continue to pursue this.  All questions answered.

## 2019-09-14 ENCOUNTER — Inpatient Hospital Stay (HOSPITAL_COMMUNITY): Payer: Self-pay | Admitting: Occupational Therapy

## 2019-09-14 ENCOUNTER — Inpatient Hospital Stay (HOSPITAL_COMMUNITY): Payer: Self-pay | Admitting: Physical Therapy

## 2019-09-14 DIAGNOSIS — N319 Neuromuscular dysfunction of bladder, unspecified: Secondary | ICD-10-CM | POA: Diagnosis present

## 2019-09-14 LAB — CBC WITH DIFFERENTIAL/PLATELET
Abs Immature Granulocytes: 0.02 10*3/uL (ref 0.00–0.07)
Basophils Absolute: 0 10*3/uL (ref 0.0–0.1)
Basophils Relative: 1 %
Eosinophils Absolute: 0.3 10*3/uL (ref 0.0–0.5)
Eosinophils Relative: 4 %
HCT: 35.4 % — ABNORMAL LOW (ref 36.0–46.0)
Hemoglobin: 11.6 g/dL — ABNORMAL LOW (ref 12.0–15.0)
Immature Granulocytes: 0 %
Lymphocytes Relative: 21 %
Lymphs Abs: 1.7 10*3/uL (ref 0.7–4.0)
MCH: 30.7 pg (ref 26.0–34.0)
MCHC: 32.8 g/dL (ref 30.0–36.0)
MCV: 93.7 fL (ref 80.0–100.0)
Monocytes Absolute: 0.6 10*3/uL (ref 0.1–1.0)
Monocytes Relative: 7 %
Neutro Abs: 5.7 10*3/uL (ref 1.7–7.7)
Neutrophils Relative %: 67 %
Platelets: 308 10*3/uL (ref 150–400)
RBC: 3.78 MIL/uL — ABNORMAL LOW (ref 3.87–5.11)
RDW: 12.3 % (ref 11.5–15.5)
WBC: 8.5 10*3/uL (ref 4.0–10.5)
nRBC: 0 % (ref 0.0–0.2)

## 2019-09-14 LAB — COMPREHENSIVE METABOLIC PANEL
ALT: 21 U/L (ref 0–44)
AST: 18 U/L (ref 15–41)
Albumin: 2.7 g/dL — ABNORMAL LOW (ref 3.5–5.0)
Alkaline Phosphatase: 65 U/L (ref 38–126)
Anion gap: 9 (ref 5–15)
BUN: 12 mg/dL (ref 8–23)
CO2: 26 mmol/L (ref 22–32)
Calcium: 8.2 mg/dL — ABNORMAL LOW (ref 8.9–10.3)
Chloride: 101 mmol/L (ref 98–111)
Creatinine, Ser: 0.61 mg/dL (ref 0.44–1.00)
GFR calc Af Amer: >60 mL/min (ref >60)
GFR calc non Af Amer: >60 mL/min (ref >60)
Glucose, Bld: 118 mg/dL — ABNORMAL HIGH (ref 70–99)
Potassium: 3.3 mmol/L — ABNORMAL LOW (ref 3.5–5.1)
Sodium: 136 mmol/L (ref 135–145)
Total Bilirubin: 0.8 mg/dL (ref 0.3–1.2)
Total Protein: 5.8 g/dL — ABNORMAL LOW (ref 6.5–8.1)

## 2019-09-14 MED ORDER — POTASSIUM CHLORIDE CRYS ER 20 MEQ PO TBCR
40.0000 meq | EXTENDED_RELEASE_TABLET | Freq: Two times a day (BID) | ORAL | Status: AC
  Administered 2019-09-14 (×2): 40 meq via ORAL
  Filled 2019-09-14 (×2): qty 2

## 2019-09-14 MED ORDER — ENOXAPARIN SODIUM 40 MG/0.4ML ~~LOC~~ SOLN
40.0000 mg | SUBCUTANEOUS | Status: DC
  Administered 2019-09-14 – 2019-10-07 (×24): 40 mg via SUBCUTANEOUS
  Filled 2019-09-14 (×24): qty 0.4

## 2019-09-14 MED ORDER — GABAPENTIN 300 MG PO CAPS
300.0000 mg | ORAL_CAPSULE | Freq: Two times a day (BID) | ORAL | Status: DC
  Administered 2019-09-14 – 2019-10-01 (×35): 300 mg via ORAL
  Filled 2019-09-14 (×35): qty 1

## 2019-09-14 NOTE — Plan of Care (Signed)
  Problem: Consults Goal: RH SPINAL CORD INJURY PATIENT EDUCATION Description:  See Patient Education module for education specifics.  Outcome: Progressing   Problem: SCI BOWEL ELIMINATION Goal: RH STG MANAGE BOWEL WITH ASSISTANCE Description: STG Manage Bowel with min Assistance. Outcome: Progressing Goal: RH STG SCI MANAGE BOWEL WITH MEDICATION WITH ASSISTANCE Description: STG SCI Manage bowel with medication with min assistance. Outcome: Progressing Goal: RH STG SCI MANAGE BOWEL PROGRAM W/ASSIST OR AS APPROPRIATE Description: STG SCI Manage bowel program with min assist or as appropriate. Outcome: Progressing   Problem: SCI BLADDER ELIMINATION Goal: RH OTHER STG BLADDER ELIMINATION GOALS W/ASSIST Description: Other STG Bladder Elimination Goals With min Assistance Outcome: Progressing   Problem: RH SAFETY Goal: RH STG ADHERE TO SAFETY PRECAUTIONS W/ASSISTANCE/DEVICE Description: STG Adhere to Safety Precautions With cues and reminders Outcome: Progressing   Problem: RH PAIN MANAGEMENT Goal: RH STG PAIN MANAGED AT OR BELOW PT'S PAIN GOAL Description: Pain level less than 4 on scale of 0-10 Outcome: Progressing   Problem: RH KNOWLEDGE DEFICIT SCI Goal: RH STG INCREASE KNOWLEDGE OF SELF CARE AFTER SCI Description: Pt will be able to demonstrate understanding of fall prevention and proper steps to call for help in case of emergency independently upon discharge. Pt will be able to identify and eliminate environmental hazards that can lead to pt's falling independently.  Outcome: Progressing   

## 2019-09-14 NOTE — Evaluation (Signed)
Occupational Therapy Assessment and Plan  Patient Details  Name: Sandra Parrish MRN: [id number] Date of Birth: [birth year]  OT Diagnosis: abnormal posture, muscle weakness (generalized) and coordination disorder, and decreased sensation Rehab Potential: Rehab Potential (ACUTE ONLY): Fair ELOS: 3 weeks   Today's Date: 09/14/2019 OT Individual Time: 1100-1208 OT Individual Time Calculation (min): 68 min     Problem List:  Patient Active Problem List   Diagnosis Date Noted  . Neurogenic bladder 09/14/2019  . Cervical myelopathy ([place]) 09/13/2019  . Cervical spinal stenosis   . Bradycardia   . Acute blood loss anemia   . Urinary retention   . Postoperative pain   . Drug induced constipation   . Stenosis of cervical spine with myelopathy ([place]) 09/07/2019    Past Medical History: History reviewed. No pertinent past medical history. Past Surgical History:  Past Surgical History:  Procedure Laterality Date  . ANTERIOR CERVICAL DECOMP/DISCECTOMY FUSION N/A 09/09/2019   Procedure: ANTERIOR CERVICAL DECOMPRESSION/DISCECTOMY FUSION CERVICAL FIVE- CERVICAL SIX, CERVICAL SIX- CERVICAL SEVEN;  Surgeon: Consuella Lose, MD;  Location: [facility];  Service: Neurosurgery;  Laterality: N/A;  ANTERIOR CERVICAL DECOMPRESSION/DISCECTOMY FUSION CERVICAL FIVE- CERVICAL SIX, CERVICAL SIX- CERVICAL SEVEN    Assessment & Plan Clinical Impression: Patient is a 73 y.o. year old female with unremarkable past medical history. Per chart review and patient, patient lives alone. She was independent prior to admission. She does not have any family in the area but does have a neighbor and some friends. 1 level home 6 steps to entry. Presented 09/07/2019 after a fall without loss of consciousness and progressive right upper extremity and right lower extremity weakness and tingling. Admission chemistries unremarkable, SARS coronavirus negative, hemoglobin 11.6. Cranial CT scan unremarkable for acute intracranial  process. There was a midline frontal scalp swelling contusion with small hematoma measuring 4 mm in maximal thickness. CT/MRI cervical spine showed abnormal cord edema versus myelomalacia extending from C4-5 through the mid C7 level associated with severe central narrowing of the thecal sac at C5-6 and moderate central narrowing C6-7 with myelopathy. Underwent discectomy at C5-6, 6-7 for decompression of spinal cord and existing nerve root placement of anterior instrumentation consisting of interbody plate and screws arthrodesis on 09/09/2019 per Dr. Kathyrn Sheriff. Hospital course complicated by pain as well as bouts of urinary retention with initially Foley catheter tube placed maintained on Flomax. Cervical collar when out of bed. Tolerating a regular diet.  .  Patient transferred to CIR on 09/13/2019 .    Patient currently requires mod - total of 2 with basic self-care skills secondary to muscle weakness, decreased cardiorespiratoy endurance, decreased coordination and decreased motor planning and decreased sitting balance, decreased standing balance, decreased postural control and decreased balance strategies.  Prior to hospitalization, patient could complete ADLs and IADLs with modified independent .  Patient will benefit from skilled intervention to decrease level of assist with basic self-care skills prior to discharge home with care partner.  Anticipate patient will require 24 hour supervision and minimal physical assistance and follow up home health.  OT - End of Session Activity Tolerance: Decreased this session Endurance Deficit: Yes Endurance Deficit Description: frequent rest breaks during functional transfers and self care OT Assessment Rehab Potential (ACUTE ONLY): Fair OT Barriers to Discharge: Decreased caregiver support;Home environment access/layout OT Patient demonstrates impairments in the following area(s): Balance;Endurance;Motor;Pain;Safety OT Basic ADL's Functional  Problem(s): Grooming;Bathing;Dressing;Toileting OT Transfers Functional Problem(s): Toilet;Tub/Shower OT Additional Impairment(s): None OT Plan OT Intensity: Minimum of 1-2 x/day, 45 to 90 minutes OT Frequency:  5 out of 7 days OT Duration/Estimated Length of Stay: 3 weeks OT Treatment/Interventions: Balance/vestibular training;DME/adaptive equipment instruction;Patient/family education;Therapeutic Activities;Wheelchair propulsion/positioning;Cognitive remediation/compensation;Psychosocial support;Therapeutic Exercise;Community reintegration;Functional mobility training;UE/LE Strength taining/ROM;Self Care/advanced ADL retraining;Discharge planning;Neuromuscular re-education;UE/LE Coordination activities;Pain management OT Self Feeding Anticipated Outcome(s): set up A OT Basic Self-Care Anticipated Outcome(s): min A OT Toileting Anticipated Outcome(s): min A OT Bathroom Transfers Anticipated Outcome(s): min A OT Recommendation Recommendations for Other Services: Neuropsych consult;Therapeutic Recreation consult Therapeutic Recreation Interventions: Stress management Patient destination: Home Follow Up Recommendations: 24 hour supervision/assistance;Home health OT Equipment Recommended: To be determined   Skilled Therapeutic Intervention Upon entering the room, pt supine in bed with no c/o pain and agreeable to OT intervention. Pt very anxious with mobility and needing increased cuing this session. LB self care performed from bed level for safety with pt rolling L <> R with max A and unaware of incontinence. Supine >sit with max A to EOB. UB self care tasks performed seated on EOB with min - mod A sitting balance. Pt washing with B UEs but increased difficulty utilizing R UE. Pt needing mod A to don clean gown. Sit >stand attempted but pt unable to clear the bed. Pt standing in stedy with +2 assistance for safety and transferred into recliner chair with wheelchair cushion placed. OT assisted pt  with finding comfortable position and pt very excited about being up in chair. OT educated pt on OT purpose, POC, and goals with pt verbalizing understanding and agreement. Pt remained in wheelchair with call bell and all needed items within reach upon exiting the room.   OT Evaluation Precautions/Restrictions  Precautions Precautions: Fall;Cervical Precaution Comments: reviewed cervical precautions Cervical Brace: Soft collar Restrictions Weight Bearing Restrictions: No  Pain Pain Assessment Pain Scale: Faces Faces Pain Scale: No hurt Home Living/Prior Functioning Home Living Available Help at Discharge: Friend(s), Available PRN/intermittently Type of Home: Apartment Home Access: Stairs to enter [employer] of Steps: 6+8 Entrance Stairs-Rails: Right Home Layout: One level Bathroom Shower/Tub: Tub/shower unit, Architectural technologist: Standard Bathroom Accessibility: Yes  Lives With: Alone Prior Function Level of Independence: Independent with gait, Independent with transfers  Able to Take Stairs?: Yes Driving: Yes Vocation: Retired Leisure: Hobbies-yes (Comment) Comments: crocheting; 2 cats Vision Baseline Vision/History: Wears glasses Wears Glasses: Reading only Patient Visual Report: No change from baseline Perception  Perception: Within Functional Limits Praxis Praxis: Intact Cognition Overall Cognitive Status: Within Functional Limits for tasks assessed Arousal/Alertness: Awake/alert Orientation Level: Person;Place;Situation Person: Oriented Place: Oriented Situation: Oriented Year: 2020 Month: November Day of Week: Correct Memory: Appears intact Immediate Memory Recall: Sock;Blue;Bed Memory Recall Sock: Without Cue Memory Recall Blue: Without Cue Memory Recall Bed: Without Cue Attention: Focused Focused Attention: Appears intact Awareness: Appears intact Problem Solving: Appears intact Safety/Judgment: Appears  intact Sensation Sensation Light Touch: Impaired Detail Light Touch Impaired Details: Impaired RLE Proprioception: Appears Intact Coordination Gross Motor Movements are Fluid and Coordinated: No Fine Motor Movements are Fluid and Coordinated: No Coordination and Movement Description: impaired 2/2 R-side weakness Motor  Motor Motor: Abnormal tone;Abnormal postural alignment and control;Tetraplegia Motor - Skilled Clinical Observations: R side>L side quadraplegia Mobility  Bed Mobility Bed Mobility: Rolling Right;Rolling Left;Supine to Sit;Sit to Supine Rolling Right: Moderate Assistance - Patient 50-74% Rolling Left: Maximal Assistance - Patient 25-49% Supine to Sit: Maximal Assistance - Patient - Patient 25-49% Sit to Supine: Maximal Assistance - Patient 25-49% Transfers Sit to Stand: Total Assistance - Patient < 25%  Trunk/Postural Assessment  Cervical Assessment Cervical Assessment: Exceptions to WFL(cervical precautions) Thoracic  Assessment Thoracic Assessment: Exceptions to WFL(kyphotic) Lumbar Assessment Lumbar Assessment: Exceptions to WFL(posterior pelvic tilt) Postural Control Postural Control: Deficits on evaluation Trunk Control: impaired Righting Reactions: delayed Protective Responses: insufficient Postural Limitations: impaired  Balance Balance Balance Assessed: Yes Static Sitting Balance Static Sitting - Balance Support: Left upper extremity supported;Feet supported Static Sitting - Level of Assistance: 4: Min assist Dynamic Sitting Balance Dynamic Sitting - Balance Support: Left upper extremity supported;Feet supported;During functional activity Dynamic Sitting - Level of Assistance: 2: Max assist Extremity/Trunk Assessment RUE Assessment RUE Assessment: Exceptions to [facility] Passive Range of Motion (PROM) Comments: WFLs Active Range of Motion (AROM) Comments: proximal >distal General Strength Comments: 2/5 wrist and digits, 3+/5 elbow and shoulder LUE  Assessment LUE Assessment: Within Functional Limits     Refer to Care Plan for Long Term Goals  Recommendations for other services: Neuropsych and Therapeutic Recreation  Stress management   Discharge Criteria: Patient will be discharged from OT if patient refuses treatment 3 consecutive times without medical reason, if treatment goals not met, if there is a change in medical status, if patient makes no progress towards goals or if patient is discharged from hospital.  The above assessment, treatment plan, treatment alternatives and goals were discussed and mutually agreed upon: by patient  Gypsy Decant 09/14/2019, 5:30 PM

## 2019-09-14 NOTE — Evaluation (Signed)
Physical Therapy Assessment and Plan  Patient Details  Name: Sandra Parrish MRN: [id number] Date of Birth: [birth year]  PT Diagnosis: Abnormal posture, Difficulty walking, Impaired sensation, Muscle weakness and Quadriplegia Rehab Potential: Good ELOS: 21-24 days   Today's Date: 09/14/2019 PT Individual Time: 0800-0915 PT Individual Time Calculation (min): 75 min    Problem List:  Patient Active Problem List   Diagnosis Date Noted  . Neurogenic bladder 09/14/2019  . Cervical myelopathy ([place]) 09/13/2019  . Cervical spinal stenosis   . Bradycardia   . Acute blood loss anemia   . Urinary retention   . Postoperative pain   . Drug induced constipation   . Stenosis of cervical spine with myelopathy ([place]) 09/07/2019    Past Medical History: History reviewed. No pertinent past medical history. Past Surgical History:  Past Surgical History:  Procedure Laterality Date  . ANTERIOR CERVICAL DECOMP/DISCECTOMY FUSION N/A 09/09/2019   Procedure: ANTERIOR CERVICAL DECOMPRESSION/DISCECTOMY FUSION CERVICAL FIVE- CERVICAL SIX, CERVICAL SIX- CERVICAL SEVEN;  Surgeon: Consuella Lose, MD;  Location: [facility];  Service: Neurosurgery;  Laterality: N/A;  ANTERIOR CERVICAL DECOMPRESSION/DISCECTOMY FUSION CERVICAL FIVE- CERVICAL SIX, CERVICAL SIX- CERVICAL SEVEN    Assessment & Plan Clinical Impression:  Sandra Parrish a 73 year old right-handed female with unremarkable past medical history. Per chart review and patient, patient lives alone. She was independent prior to admission. She does not have any family in the area but does have a neighbor and some friends. 1 level home 6 steps to entry. Presented 09/07/2019 after a fall without loss of consciousness and progressive right upper extremity and right lower extremity weakness and tingling. Admission chemistries unremarkable, SARS coronavirus negative, hemoglobin 11.6. Cranial CT scan unremarkable for acute intracranial process. There was a  midline frontal scalp swelling contusion with small hematoma measuring 4 mm in maximal thickness. CT/MRI cervical spine showed abnormal cord edema versus myelomalacia extending from C4-5 through the mid C7 level associated with severe central narrowing of the thecal sac at C5-6 and moderate central narrowing C6-7 with myelopathy. Underwent discectomy at C5-6, 6-7 for decompression of spinal cord and existing nerve root placement of anterior instrumentation consisting of interbody plate and screws arthrodesis on 09/09/2019 per Dr. Kathyrn Sheriff. Hospital course complicated by pain as well as bouts of urinary retention with initially Foley catheter tube placed maintained on Flomax. Cervical collar when out of bed. Tolerating a regular diet. Therapy evaluations completed and patient was admitted for a comprehensive rehab program. Patient transferred to CIR on 09/13/2019 .   Patient currently requires total with mobility secondary to muscle weakness, abnormal tone and unbalanced muscle activation and decreased sitting balance, decreased standing balance, decreased postural control and decreased balance strategies.  Prior to hospitalization, patient was independent  with mobility and lived with Alone in a [place] home.  Home access is 6+8Stairs to enter.  Patient will benefit from skilled PT intervention to maximize safe functional mobility, minimize fall risk and decrease caregiver burden for planned discharge home with intermittent assist.  Anticipate patient will benefit from follow up [facility] at discharge.  PT - End of Session Activity Tolerance: Tolerates 30+ min activity with multiple rests Endurance Deficit: Yes Endurance Deficit Description: frequent rest breaks during functional transfers and mobility PT Assessment Rehab Potential (ACUTE/IP ONLY): Good PT Barriers to Discharge: [place] home environment;Decreased caregiver support;Home environment access/layout;Lack of/limited family support PT  Patient demonstrates impairments in the following area(s): Balance;Endurance;Motor;Pain;Safety;Sensory PT Transfers Functional Problem(s): Bed Mobility;Bed to Chair;Car;Furniture;Floor PT Locomotion Functional Problem(s): Ambulation;Wheelchair Mobility;Stairs PT Plan PT Intensity: Minimum of 1-2  x/day ,45 to 90 minutes PT Frequency: 5 out of 7 days PT Duration Estimated Length of Stay: 21-24 days PT Treatment/Interventions: Ambulation/gait training;Balance/vestibular training;Community reintegration;Discharge planning;DME/adaptive equipment instruction;Functional electrical stimulation;Functional mobility training;Neuromuscular re-education;Pain management;Patient/family education;Psychosocial support;Splinting/orthotics;Stair training;Therapeutic Activities;Therapeutic Exercise;UE/LE Strength taining/ROM;UE/LE Coordination activities;Wheelchair propulsion/positioning PT Transfers Anticipated Outcome(s): min A PT Locomotion Anticipated Outcome(s): min A at w/c level PT Recommendation Recommendations for Other Services: Neuropsych consult;Therapeutic Recreation consult Therapeutic Recreation Interventions: Stress management Follow Up Recommendations: Home health PT Patient destination: Home Equipment Recommended: Wheelchair (measurements);Wheelchair cushion (measurements);Rolling walker with 5" wheels Equipment Details: TBD pending progress  Skilled Therapeutic Intervention Evaluation completed (see details above and below) with education on PT POC and goals and individual treatment initiated with focus on functional transfer assessment, orientation to rehab schedule, discussion of goals and ELOS. Pt received seated in bed, agreeable to PT eval. Pt reports 5/10 pain in neck radiating into B shoulders. Discussed nerve pain that pt is experiencing and MD present during session to take note of pain and how to address via medication. Rolling to the L with max A and to the R with min A with use of  bedrails. Supine to sit with max A for BLE management and trunk control. Pt requires CGA to min A to maintain sitting balance EOB with BLE support and LUE support. Pt is dependent to don soft collar while seated EOB. Sit to stand to stedy with total A +1. Pt exhibits fair ability to maintain upright stance in stedy and has onset of BLE in standing in stedy due to pressure through LE. Deferred further OOB mobility due to time constraints and pt's pain level with standing with stedy. Sit to supine max A for trunk control and BLE management. Pt on room air during session, SpO2 remains at 94% and above. Pt left semi-reclined in bed with needs in reach at end of session.  PT Evaluation Precautions/Restrictions Precautions Precautions: Fall;Cervical Precaution Comments: reviewed cervical precautions Cervical Brace: Soft collar(when OOB) Restrictions Weight Bearing Restrictions: No Home Living/Prior Functioning Home Living Available Help at Discharge: Friend(s);Available PRN/intermittently Type of Home: Apartment Home Access: Stairs to enter Entrance Stairs-Number of Steps: 6+8 Entrance Stairs-Rails: Right Home Layout: One level  Lives With: Alone Prior Function Level of Independence: Independent with gait;Independent with transfers  Able to Take Stairs?: Yes Driving: Yes Vocation: Retired Leisure: Hobbies-yes (Comment) Comments: crocheting; 2 cats Vision/Perception  Perception Perception: Within Functional Limits Praxis Praxis: Intact  Cognition Overall Cognitive Status: Within Functional Limits for tasks assessed Arousal/Alertness: Awake/alert Orientation Level: Oriented X4 Attention: Focused Focused Attention: Appears intact Memory: Appears intact Awareness: Appears intact Problem Solving: Appears intact Safety/Judgment: Appears intact Sensation Sensation Light Touch: Impaired Detail Light Touch Impaired Details: Impaired RLE(intermittent ability to detect light touch  sensation in RLE) Proprioception: Appears Intact Coordination Gross Motor Movements are Fluid and Coordinated: No Fine Motor Movements are Fluid and Coordinated: No Coordination and Movement Description: impaired 2/2 R-side weakness Motor  Motor Motor: Abnormal tone;Abnormal postural alignment and control;Tetraplegia Motor - Skilled Clinical Observations: R side>L side quadraplegia  Mobility Bed Mobility Bed Mobility: Rolling Right;Rolling Left;Supine to Sit;Sit to Supine Rolling Right: Moderate Assistance - Patient 50-74% Rolling Left: Maximal Assistance - Patient 25-49% Supine to Sit: Maximal Assistance - Patient - Patient 25-49% Sit to Supine: Maximal Assistance - Patient 25-49% Transfers Transfers: Sit to Stand Sit to Stand: Total Assistance - Patient < 25% Transfer (Assistive device): Other (Comment)(to stedy) Artist / Additional Locomotion Stairs: No Product manager Mobility: No  Trunk/Postural  Assessment  Cervical Assessment Cervical Assessment: Exceptions to WFL(cervical precautions) Thoracic Assessment Thoracic Assessment: Exceptions to WFL(kyphotic) Lumbar Assessment Lumbar Assessment: Exceptions to WFL(posterior pelvic tilt) Postural Control Postural Control: Deficits on evaluation Trunk Control: impaired Righting Reactions: delayed Protective Responses: insufficient Postural Limitations: impaired  Balance Balance Balance Assessed: Yes Static Sitting Balance Static Sitting - Balance Support: Left upper extremity supported;Feet supported Static Sitting - Level of Assistance: 4: Min assist Dynamic Sitting Balance Dynamic Sitting - Balance Support: Left upper extremity supported;Feet supported;During functional activity Dynamic Sitting - Level of Assistance: 2: Max assist Extremity Assessment   RLE Assessment RLE Assessment: Exceptions to [facility] General Strength Comments: impaired, see below RLE Strength Right Hip Flexion:  2-/5 Right Knee Flexion: 2/5 Right Knee Extension: 2/5 Right Ankle Dorsiflexion: 2-/5 LLE Assessment LLE Assessment: Exceptions to [facility] General Strength Comments: impaired, see below LLE Strength Left Hip Flexion: 3/5 Left Knee Flexion: 3/5 Left Knee Extension: 3/5 Left Ankle Dorsiflexion: 3/5    Refer to Care Plan for Long Term Goals  Recommendations for other services: Neuropsych and Therapeutic Recreation  Stress management  Discharge Criteria: Patient will be discharged from PT if patient refuses treatment 3 consecutive times without medical reason, if treatment goals not met, if there is a change in medical status, if patient makes no progress towards goals or if patient is discharged from hospital.  The above assessment, treatment plan, treatment alternatives and goals were discussed and mutually agreed upon: by patient   Excell Seltzer, PT, DPT 09/14/2019, 3:51 PM

## 2019-09-14 NOTE — Patient Care Conference (Signed)
Inpatient RehabilitationTeam Conference and Plan of Care Update Date: 09/14/2019   Time: 11:15 AM   Patient Name: Sandra Parrish      Medical Record Number: [id number]  Date of Birth: [birth year] Sex: Female         Room/Bed: 4M12C/4M12C-01 Payor Info: Payor: MEDICARE / Plan: MEDICARE PART A / Product Type: *No Product type* /    Admit Date/Time:  09/13/2019  1:20 PM  Primary Diagnosis:  Cervical myelopathy ([place])  Patient Active Problem List   Diagnosis Date Noted  . Neurogenic bladder 09/14/2019  . Cervical myelopathy ([place]) 09/13/2019  . Cervical spinal stenosis   . Bradycardia   . Acute blood loss anemia   . Urinary retention   . Postoperative pain   . Drug induced constipation   . Stenosis of cervical spine with myelopathy ([place]) 09/07/2019    Expected Discharge Date: Expected Discharge Date: (estimated LOS 3 weeks.)  Team Members Present: Physician leading conference: Dr. Courtney Heys Social Worker Present: Lennart Pall, LCSW Nurse Present: Mohammed Kindle, RN Case Manager: Karene Fry, RN PT Present: Excell Seltzer, PT OT Present: Amy Rounds, OT SLP Present: Jettie Booze, CF-SLP PPS Coordinator present : Gunnar Fusi, SLP     Current Status/Progress Goal Weekly Team Focus  Bowel/Bladder   neurogenic bladder and drug induced constipation bm 11/25  improve continence  address toileting needs and bowel program   Swallow/Nutrition/ Hydration             ADL's   eval pending         Mobility   max A bed mobility, total A to +2 to stand to stedy  min A overall  PT eval today   Communication             Safety/Cognition/ Behavioral Observations            Pain   experiences neck pain with movement, laying flat. has prns available  pain less than 5  q shift and prn   Skin   anterior surgical wound with honey comb, cervical collar when oob. large amount of brusiing to body and extremeties that appear to be healing  maintain skin integrity  q shift and prn    Rehab  Goals Patient on target to meet rehab goals: Yes *See Care Plan and progress notes for long and short-term goals.     Barriers to Discharge  Current Status/Progress Possible Resolutions Date Resolved   Nursing                  PT  Inaccessible home environment;Decreased caregiver support;Home environment access/layout;Lack of/limited family support                 OT Decreased caregiver support;Home environment access/layout                SLP                SW                Discharge Planning/Teaching Needs:  Pt has only intermittent assistance available from friends.  May need to consider SNF, however, insurance coverage an issue as well.  TBD    Team Discussion: Cervical myelopathy, has nerve pain neck, shoulder down to elbows, urinary retention, had BM last night.  RN - anxiety, I+O cath 9:30 am, not voiding.  OT eval pending.  PT max bed, steady with total +2, min A goals.  No family nearby.   Revisions to Treatment  Plan: N/A     Medical Summary Current Status: cervical myelopathy; needed suppsoitory and PO bowel  meds to go last night- cathing for bladder required Weekly Focus/Goal: max assist bed mobility- cera steady  Barriers to Discharge: Incontinence;Neurogenic Bowel & Bladder;Decreased family/caregiver support;Medical stability;Wound care;Weight bearing restrictions;Weight;Medication compliance  Barriers to Discharge Comments: high anxiety Possible Resolutions to Barriers: OT eval pending   Continued Need for Acute Rehabilitation Level of Care: The patient requires daily medical management by a physician with specialized training in physical medicine and rehabilitation for the following reasons: Direction of a multidisciplinary physical rehabilitation program to maximize functional independence : Yes Medical management of patient stability for increased activity during participation in an intensive rehabilitation regime.: Yes Analysis of laboratory values and/or  radiology reports with any subsequent need for medication adjustment and/or medical intervention. : Yes   I attest that I was present, lead the team conference, and concur with the assessment and plan of the team.   Trish Mage 09/16/2019, 5:51 PM  Team conference was held via web/ teleconference due to COVID - 19

## 2019-09-14 NOTE — Progress Notes (Signed)
[place] PHYSICAL MEDICINE & REHABILITATION PROGRESS NOTE   Subjective/Complaints:  Pt reports pain is doing better than initially- but still having a lot of nerve pain at neck, between shoulder blades- and down UEs to elbow B/L  Had a LBM last night, but took all PO meds and suppository to go- also voided last night, but required a in/out cath this AM ~ 9am- Is currently on Flomax, and is going some, but not emptying.   Not clear if it's constipation or neurogenic bowel.   ROS- denies CP, SOB, N/V/D  Objective:   Vas [place] Lower Extremity Venous (dvt)  Result Date: 09/14/2019  Lower Venous Study Indications: Swelling.  Comparison Study: no prior Performing Technologist: Blanch Media RVS  Examination Guidelines: A complete evaluation includes B-mode imaging, spectral Doppler, color Doppler, and power Doppler as needed of all accessible portions of each vessel. Bilateral testing is considered an integral part of a complete examination. Limited examinations for reoccurring indications may be performed as noted.  +---------+---------------+---------+-----------+----------+--------------+ RIGHT    CompressibilityPhasicitySpontaneityPropertiesThrombus Aging +---------+---------------+---------+-----------+----------+--------------+ CFV      Full           Yes      Yes                                 +---------+---------------+---------+-----------+----------+--------------+ SFJ      Full                                                        +---------+---------------+---------+-----------+----------+--------------+ FV Prox  Full                                                        +---------+---------------+---------+-----------+----------+--------------+ FV Mid   Full                                                        +---------+---------------+---------+-----------+----------+--------------+ FV DistalFull                                                         +---------+---------------+---------+-----------+----------+--------------+ PFV      Full                                                        +---------+---------------+---------+-----------+----------+--------------+ POP      Full           Yes      Yes                                 +---------+---------------+---------+-----------+----------+--------------+  PTV      Full                                                        +---------+---------------+---------+-----------+----------+--------------+ PERO     Full                                                        +---------+---------------+---------+-----------+----------+--------------+   +---------+---------------+---------+-----------+----------+--------------+ LEFT     CompressibilityPhasicitySpontaneityPropertiesThrombus Aging +---------+---------------+---------+-----------+----------+--------------+ CFV      Full           Yes      Yes                                 +---------+---------------+---------+-----------+----------+--------------+ SFJ      Full                                                        +---------+---------------+---------+-----------+----------+--------------+ FV Prox  Full                                                        +---------+---------------+---------+-----------+----------+--------------+ FV Mid   Full                                                        +---------+---------------+---------+-----------+----------+--------------+ FV DistalFull                                                        +---------+---------------+---------+-----------+----------+--------------+ PFV      Full                                                        +---------+---------------+---------+-----------+----------+--------------+ POP      Full           Yes      Yes                                  +---------+---------------+---------+-----------+----------+--------------+ PTV      Full                                                        +---------+---------------+---------+-----------+----------+--------------+  PERO     Full                                                        +---------+---------------+---------+-----------+----------+--------------+     Summary: Right: There is no evidence of deep vein thrombosis in the lower extremity. No cystic structure found in the popliteal fossa. Left: There is no evidence of deep vein thrombosis in the lower extremity. No cystic structure found in the popliteal fossa.  *See table(s) above for measurements and observations. Electronically signed by Deitra Mayo MD on 09/14/2019 at 8:33:10 AM.    Final    Recent Labs    09/14/19 0419  WBC 8.5  HGB 11.6*  HCT 35.4*  PLT 308   Recent Labs    09/14/19 0419  NA 136  K 3.3*  CL 101  CO2 26  GLUCOSE 118*  BUN 12  CREATININE 0.61  CALCIUM 8.2*    Intake/Output Summary (Last 24 hours) at 09/14/2019 1153 Last data filed at 09/14/2019 0930 Gross per 24 hour  Intake 320 ml  Output 1950 ml  Net -1630 ml     Physical Exam: Vital Signs Blood pressure (!) 118/53, pulse 72, temperature 98 F (36.7 C), temperature source Oral, resp. rate 18, SpO2 96 %.  Physical Exam Vitals , Dopplers, and labs reviewed General: No acute distress; sitting up in bed with PT at bedside; slightly anxious, nervous and laughing a lot, NAD Mood and affect are appropriate except slightly anxious Heart: Regular rate and rhythm Lungs: Clear to auscultation, breathing unlabored, no rales or wheezes Abdomen: Positive bowel sounds, soft nontender to palpation, nondistended Extremities: No clubbing, cyanosis, or edema Skin:, honeycomb dressing over the right anterior lower cervical incision. Neurologic: Alert and oriented x3, motor strength is 3 - in the right deltoid 3 - in the biceps triceps  finger flexors and extensors Left upper extremity is 4 - at the deltoid bicep tricep finger flexors and extensors 4/5 in the hip flexors knee extensors ankle dorsiflexors plantar flexors bilaterally  lower extremities Fine motor is reduced bilateral hands with finger to thumb opposition. Musculoskeletal: Good passive range of motion in shoulders and hips as well as knees and ankles. Neck range of motion not tested specifically, but has decreased cervical flexion/rotation/side bending seen on exam    Assessment/Plan: 1. Functional deficits secondary to Cervical myelopathy s/p C5/6 discectomy, C6-7 decompression and plates and screws arthrodesis  to  which require 3+ hours per day of interdisciplinary therapy in a comprehensive inpatient rehab setting.  Physiatrist is providing close team supervision and 24 hour management of active medical problems listed below.  Physiatrist and rehab team continue to assess barriers to discharge/monitor patient progress toward functional and medical goals  Care Tool:  Bathing        Body parts bathed by helper: (P) Buttocks     Bathing assist Assist Level: (P) Total Assistance - Patient < 25%     Upper Body Dressing/Undressing Upper body dressing   What is the patient wearing?: (P) Hospital gown only    Upper body assist Assist Level: (P) Maximal Assistance - Patient 25 - 49%    Lower Body Dressing/Undressing Lower body dressing      What is the patient wearing?: (P) Incontinence brief     Lower body assist Assist for lower  body dressing: (P) Moderate Assistance - Patient 50 - 74%     Toileting Toileting    Toileting assist Assist for toileting: (P) Maximal Assistance - Patient 25 - 49%     Transfers Chair/bed transfer  Transfers assist           Locomotion Ambulation   Ambulation assist              Walk 10 feet activity   Assist           Walk 50 feet activity   Assist           Walk 150  feet activity   Assist           Walk 10 feet on uneven surface  activity   Assist           Wheelchair     Assist               Wheelchair 50 feet with 2 turns activity    Assist            Wheelchair 150 feet activity     Assist          Blood pressure (!) 118/53, pulse 72, temperature 98 F (36.7 C), temperature source Oral, resp. rate 18, SpO2 96 %.    Medical Problem List and Plan: 1.Right upper and lower extremity weaknesssecondary to cervical myelopathy.S/Pdiscectomy C5-6, 6-7 decompression placement of anterior instrumentation interbody plate and screws arthrodesis 09/09/2019. Cervical collar when out of bed -patient may shower -ELOS/Goals: 10-14d, supervision with mobility, min assist showering and lower extremity dressing mod I/supervision upper limb ADL 2. Antithrombotics: -DVT/anticoagulation:SCDs. Check vascular study 11/24- Dopplers (-) however has been since 11/19 since surgery- will see if can get Lovenox started.  -antiplatelet therapy: N/A 3. Pain Management:Oxycodone and Robaxin as needed  11/24- will start Gabapentin 300 mg BID for nerve pain and titrate up as required 4. Mood:Provide emotional support -antipsychotic agents: N/A 5. Neuropsych: This patientiscapable of making decisions on herown behalf. 6. Skin/Wound Care:Routine skin checks 7. Fluids/Electrolytes/Nutrition:Routine in and outs with follow-up chemistries 8. 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 Flomax 0.4 mg daily. Check PVR  11/24- is requiring in/out caths- will cont' flomax and see if gets return 9. Constipation, likely due to Neurogenic bowel. Laxative assistance  11/24- will see how does, but likely will need bowel program.  10. Hypokalemia  11/124- will replete since K+ is 3.3- replete with 40 mEq x2 today and recheck later this week.   LOS: 1 days A FACE TO FACE  EVALUATION WAS PERFORMED  Railee Bonillas 09/14/2019, 11:53 AM

## 2019-09-14 NOTE — Progress Notes (Signed)
Occupational Therapy Session Note  Patient Details  Name: Sandra Parrish MRN: [id number] Date of Birth: 14-Nov-1945  Today's Date: 09/14/2019 OT Individual Time: 1410-1448 OT Individual Time Calculation (min): 38 min  and Today's Date: 09/14/2019 OT Missed Time: 22 Minutes Missed Time Reason: Patient fatigue   Skilled Therapeutic Interventions/Progress Updates:    Treatment session with focus on dominant RUE strengthening and functional mobility.  Pt received asleep in recliner but easily aroused.  Engaged in [place] with focus on gross grasp and thumb opposition.  Provided pt with yellow foam block to provide input in hand when engaging in grasp strengthening.  Encouraged pt to attempt thumb opposition with foam block, pt able to complete trace movement with first digit but no other digits at this time.  Engaged in reaching and grasp to hold hairbrush while also discussing adaptations to holding utensils during self-feeding.  Pt reporting fatigue and requesting to return to bed.  Max assist +2 from recliner seat in to [facility] with good anterior weight shift.  Pt able to complete sit > stand from [place] seat with Min assist of one and 2nd person for safety.  Returned to supine with +2 and engaged in rolling with min-mod assist for increased positioning in bed.  Pt remained semi-reclined with all needs in reach.  Therapy Documentation Precautions:  Precautions Precautions: Fall, Cervical Precaution Comments: reviewed cervical precautions Cervical Brace: Soft collar(when OOB) Restrictions Weight Bearing Restrictions: No General:   Vital Signs: Therapy Vitals Temp: 98.1 F (36.7 C) Pulse Rate: 78 Resp: 17 BP: (!) 109/58 Patient Position (if appropriate): Sitting Oxygen Therapy SpO2: 97 % O2 Device: Room Air Pain:  Pt with c/o pain in neck, premedicated.   Therapy/Group: Individual Therapy  Simonne Come 09/14/2019, 3:36 PM

## 2019-09-14 NOTE — Progress Notes (Signed)
Inpatient Rehabilitation  Patient information reviewed and entered into eRehab system by Carlean Crowl M. Blaire Hodsdon, M.A., CCC/SLP, PPS Coordinator.  Information including medical coding, functional ability and quality indicators will be reviewed and updated through discharge.    

## 2019-09-14 NOTE — Progress Notes (Signed)
Monitor and assisted throughout shift patient voided large amount of urine on her own,bladder scan 136 cc.large BM with care provided, medicated for pain x1 with pain relived.Continue regime and treatment

## 2019-09-15 ENCOUNTER — Inpatient Hospital Stay (HOSPITAL_COMMUNITY): Payer: Self-pay | Admitting: Physical Therapy

## 2019-09-15 ENCOUNTER — Encounter (HOSPITAL_COMMUNITY): Payer: Self-pay | Admitting: Psychology

## 2019-09-15 ENCOUNTER — Inpatient Hospital Stay (HOSPITAL_COMMUNITY): Payer: Self-pay | Admitting: Occupational Therapy

## 2019-09-15 DIAGNOSIS — M4802 Spinal stenosis, cervical region: Secondary | ICD-10-CM

## 2019-09-15 MED ORDER — TAMSULOSIN HCL 0.4 MG PO CAPS
0.8000 mg | ORAL_CAPSULE | Freq: Every day | ORAL | Status: DC
  Administered 2019-09-15 – 2019-10-07 (×23): 0.8 mg via ORAL
  Filled 2019-09-15 (×24): qty 2

## 2019-09-15 NOTE — Progress Notes (Signed)
Occupational Therapy Session Note  Patient Details  Name: Sandra Parrish MRN: [id number] Date of Birth: Sep 16, 1946  Today's Date: 09/15/2019 OT Individual Time: 1500-1555 OT Individual Time Calculation (min): 55 min    Short Term Goals: Week 1:  OT Short Term Goal 1 (Week 1): Pt will perform toilet transfer with assistance of 1 person. OT Short Term Goal 2 (Week 1): Pt will perform UB self care while seated unsupported with min guard. OT Short Term Goal 3 (Week 1): Pt will perform LB dressing with max A overall.  Skilled Therapeutic Interventions/Progress Updates:    Upon entering the room, pt supine in bed with no c/o pain but does report episode of nausea prior to OT arrival. Pt needing max A for supine >sit and able to sit for 25 minutes EOB with S - min A for balance. Pt engaged in theraputty task with R hand able able to move all digits this session in flexion and extension. Pt still continues to have gross grasp but much more movement noted. Pt able to roll putty, squeeze into ball shape, and pinch in opposition. Pt needing multiple rest breaks secondary to hand fatigue. Pt verbalized exhaustion and returning to supine with max A for trunk and B LEs. OT provided pt with built up handle as well for utensil and toothbrush. Pt demonstrated repetitive motion of scooping and bring to mouth. OT also placed extension phone holder AE onto bed rail and set up for pt so that she can make calls to family members. Pt remained supine at end of session with bed alarm activated and call bell within reach.   Therapy Documentation Precautions:  Precautions Precautions: Fall, Cervical Precaution Comments: reviewed cervical precautions Cervical Brace: Soft collar Restrictions Weight Bearing Restrictions: No General:   Vital Signs: Therapy Vitals Temp: 97.9 F (36.6 C) Temp Source: Oral Pulse Rate: 89 Resp: 16 BP: (!) 105/43 Patient Position (if appropriate): Sitting Oxygen Therapy SpO2: 100  % O2 Device: Room Air Pain: Pain Assessment Pain Scale: 0-10 Pain Score: 5  Pain Type: Acute pain Pain Location: Neck Pain Radiating Towards: shoulders Pain Descriptors / Indicators: Aching Pain Frequency: Intermittent Pain Onset: With Activity Pain Intervention(s): Medication (See eMAR)(pre treat for therapy)   Therapy/Group: Individual Therapy  Gypsy Decant 09/15/2019, 4:09 PM

## 2019-09-15 NOTE — Progress Notes (Signed)
Physical Therapy Session Note  Patient Details  Name: Sandra Parrish MRN: [id number] Date of Birth: October 05, 1946  Today's Date: 09/15/2019 PT Individual Time: 0800-0900; 1130-1200 PT Individual Time Calculation (min): 60 min and 30 min  Short Term Goals: Week 1:  PT Short Term Goal 1 (Week 1): Pt will initiate w/c mobility PT Short Term Goal 2 (Week 1): Pt will perform least restrictive transfer with assist x 1 PT Short Term Goal 3 (Week 1): Pt will perform bed mobility with mod A  Skilled Therapeutic Interventions/Progress Updates:    Session 1: Pt received seated in bed having just finished breakfast, agreeable to PT session. Pt reports nerve pain in BUE radiating from neck, is able to receive pain medication and nerve medication from RN during session, not rated. Rolling L/R with mod A and use of bedrails for dependent donning of brief. Supine to sit with max A for LE management and trunk control. Sit to stand with min to mod A +2 to stedy. Stedy transfer to w/c. Pt is setup A for oral hygiene while seated in w/c at the sink. Pt reports discomfort while seated in 18x18 w/c, setup 20x18 w/c for improved fit and pt comfort. Sit to stand with mod A +2 from low w/c seat height to stedy, stedy transfer to new w/c. Pt reports improved comfort while seated in new w/c. Pt agreeable to stay seated in w/c with needs in reach at end of session.  Session 2: Pt received seated in bed, agreeable to PT session. No complaints of pain. Supine BLE (AAROM for RLE and AROM for LLE) therex x 10 reps: heel slides, hip abduction. RN requesting to perform I/O cath on patient. Pt is mod A for rolling L/R for dependent doffing of brief and dependent donning once cath completed. Pt left seated in bed with needs in reach at end of session, bed alarm in place.   Therapy Documentation Precautions:  Precautions Precautions: Fall, Cervical Precaution Comments: reviewed cervical precautions Cervical Brace: Soft  collar Restrictions Weight Bearing Restrictions: No    Therapy/Group: Individual Therapy   Excell Seltzer, PT, DPT  09/15/2019, 9:52 AM

## 2019-09-15 NOTE — Progress Notes (Signed)
Occupational Therapy Session Note  Patient Details  Name: Sandra Parrish MRN: [id number] Date of Birth: 11-27-1945  Today's Date: 09/15/2019 OT Individual Time: 1307-1350 OT Individual Time Calculation (min): 43 min    Short Term Goals: Week 1:  OT Short Term Goal 1 (Week 1): Pt will perform toilet transfer with assistance of 1 person. OT Short Term Goal 2 (Week 1): Pt will perform UB self care while seated unsupported with min guard. OT Short Term Goal 3 (Week 1): Pt will perform LB dressing with max A overall.  Skilled Therapeutic Interventions/Progress Updates:    Pt received supine with c/o pain in B shoulders, 4/10. Pt rolled R with min A for brief check to ensure no incontinence present. Pt cued for log rolling technique to transfer to EOB. Max A required to lift from sidelying to sitting EOB. Pt c/o dizziness upon position change and was given several minutes with cueing for gaze stabilization to attempt and acclimate to position change. BP assessed and recorded below. BP in L arm assessed and it read 146/126. Re-assessed in R arm and it read 125/57. Pt agreeable to stedy transfer to complete oral hygiene. Pt stood with max +2 assist from EOB. Pt completed oral hygiene with set up assist to grasp toothpaste and toothbrush. Pt able to use L hand to brush teeth. Pt completed transfer back to bed and was returned to supine with mod A. Pt removed soft collar once supine in bed- verified per MD order. Pt was left supine with all needs met.   Therapy Documentation Precautions:  Precautions Precautions: Fall, Cervical Precaution Comments: reviewed cervical precautions Cervical Brace: Soft collar Restrictions Weight Bearing Restrictions: No   Therapy/Group: Individual Therapy  [name] 09/15/2019, 1:26 PM

## 2019-09-15 NOTE — Progress Notes (Signed)
[facility] PHYSICAL MEDICINE & REHABILITATION PROGRESS NOTE   Subjective/Complaints:  Pt reports bad night with pain and bladder issues.  Can't completely empty/void with voiding.  ROS- denies CP, SOB, N/V/D  Objective:   Vas [place] Lower Extremity Venous (dvt)  Result Date: 09/14/2019  Lower Venous Study Indications: Swelling.  Comparison Study: no prior Performing Technologist: Abram Sander RVS  Examination Guidelines: A complete evaluation includes B-mode imaging, spectral Doppler, color Doppler, and power Doppler as needed of all accessible portions of each vessel. Bilateral testing is considered an integral part of a complete examination. Limited examinations for reoccurring indications may be performed as noted.  +---------+---------------+---------+-----------+----------+--------------+ RIGHT    CompressibilityPhasicitySpontaneityPropertiesThrombus Aging +---------+---------------+---------+-----------+----------+--------------+ CFV      Full           Yes      Yes                                 +---------+---------------+---------+-----------+----------+--------------+ SFJ      Full                                                        +---------+---------------+---------+-----------+----------+--------------+ FV Prox  Full                                                        +---------+---------------+---------+-----------+----------+--------------+ FV Mid   Full                                                        +---------+---------------+---------+-----------+----------+--------------+ FV DistalFull                                                        +---------+---------------+---------+-----------+----------+--------------+ PFV      Full                                                        +---------+---------------+---------+-----------+----------+--------------+ POP      Full           Yes      Yes                                  +---------+---------------+---------+-----------+----------+--------------+ PTV      Full                                                        +---------+---------------+---------+-----------+----------+--------------+  PERO     Full                                                        +---------+---------------+---------+-----------+----------+--------------+   +---------+---------------+---------+-----------+----------+--------------+ LEFT     CompressibilityPhasicitySpontaneityPropertiesThrombus Aging +---------+---------------+---------+-----------+----------+--------------+ CFV      Full           Yes      Yes                                 +---------+---------------+---------+-----------+----------+--------------+ SFJ      Full                                                        +---------+---------------+---------+-----------+----------+--------------+ FV Prox  Full                                                        +---------+---------------+---------+-----------+----------+--------------+ FV Mid   Full                                                        +---------+---------------+---------+-----------+----------+--------------+ FV DistalFull                                                        +---------+---------------+---------+-----------+----------+--------------+ PFV      Full                                                        +---------+---------------+---------+-----------+----------+--------------+ POP      Full           Yes      Yes                                 +---------+---------------+---------+-----------+----------+--------------+ PTV      Full                                                        +---------+---------------+---------+-----------+----------+--------------+ PERO     Full                                                         +---------+---------------+---------+-----------+----------+--------------+  Summary: Right: There is no evidence of deep vein thrombosis in the lower extremity. No cystic structure found in the popliteal fossa. Left: There is no evidence of deep vein thrombosis in the lower extremity. No cystic structure found in the popliteal fossa.  *See table(s) above for measurements and observations. Electronically signed by Waverly Ferrari MD on 09/14/2019 at 8:33:10 AM.    Final    Recent Labs    09/14/19 0419  WBC 8.5  HGB 11.6*  HCT 35.4*  PLT 308   Recent Labs    09/14/19 0419  NA 136  K 3.3*  CL 101  CO2 26  GLUCOSE 118*  BUN 12  CREATININE 0.61  CALCIUM 8.2*    Intake/Output Summary (Last 24 hours) at 09/15/2019 0820 Last data filed at 09/15/2019 0235 Gross per 24 hour  Intake 340 ml  Output 1625 ml  Net -1285 ml     Physical Exam: Vital Signs Blood pressure (!) 118/55, pulse 71, temperature 98.5 F (36.9 C), temperature source Oral, resp. rate 17, height 5\' 6"  (1.676 m), weight 95.4 kg, SpO2 96 %.  Physical Exam Vitals , Dopplers, and labs reviewed General: No acute distress; sitting up in bed; asking about bladder issues NAD Mood and affect are appropriate except slightly anxious Heart: Regular rate and rhythm Lungs: Clear to auscultation, breathing unlabored, no rales or wheezes Abdomen: Positive bowel sounds, soft nontender to palpation, nondistended Extremities: No clubbing, cyanosis, or edema Skin:, honeycomb dressing over the right anterior lower cervical incision. Neurologic: Alert and oriented x3, motor strength is 3 - in the right deltoid 3 - in the biceps triceps finger flexors and extensors Left upper extremity is 4 - at the deltoid bicep tricep finger flexors and extensors 4/5 in the hip flexors knee extensors ankle dorsiflexors plantar flexors bilaterally  lower extremities Fine motor is reduced bilateral hands with finger to thumb  opposition. Musculoskeletal: good ROM Neck range of motion not tested specifically, but has decreased cervical flexion/rotation/side bending seen on exam    Assessment/Plan: 1. Functional deficits secondary to Cervical myelopathy s/p C5/6 discectomy, C6-7 decompression and plates and screws arthrodesis  to  which require 3+ hours per day of interdisciplinary therapy in a comprehensive inpatient rehab setting.  Physiatrist is providing close team supervision and 24 hour management of active medical problems listed below.  Physiatrist and rehab team continue to assess barriers to discharge/monitor patient progress toward functional and medical goals  Care Tool:  Bathing    Body parts bathed by patient: Right arm, Left arm, Chest, Abdomen, Face   Body parts bathed by helper: Front perineal area, Buttocks, Right upper leg, Left upper leg, Right lower leg, Left lower leg     Bathing assist Assist Level: Maximal Assistance - Patient 24 - 49%     Upper Body Dressing/Undressing Upper body dressing   What is the patient wearing?: Pull over shirt, Dress    Upper body assist Assist Level: Moderate Assistance - Patient 50 - 74%    Lower Body Dressing/Undressing Lower body dressing      What is the patient wearing?: Incontinence brief     Lower body assist Assist for lower body dressing: Dependent - Patient 0%     Toileting Toileting    Toileting assist Assist for toileting: 2 Helpers     Transfers Chair/bed transfer  Transfers assist     Chair/bed transfer assist level: Total Assistance - Patient < 25%     Locomotion Ambulation   Ambulation assist  Ambulation activity did not occur: Safety/medical concerns          Walk 10 feet activity   Assist  Walk 10 feet activity did not occur: Safety/medical concerns        Walk 50 feet activity   Assist Walk 50 feet with 2 turns activity did not occur: Safety/medical concerns         Walk 150 feet  activity   Assist Walk 150 feet activity did not occur: Safety/medical concerns         Walk 10 feet on uneven surface  activity   Assist Walk 10 feet on uneven surfaces activity did not occur: Safety/medical concerns         Wheelchair     Assist Will patient use wheelchair at discharge?: Yes Type of Wheelchair: Manual Wheelchair activity did not occur: Safety/medical concerns         Wheelchair 50 feet with 2 turns activity    Assist    Wheelchair 50 feet with 2 turns activity did not occur: Safety/medical concerns       Wheelchair 150 feet activity     Assist  Wheelchair 150 feet activity did not occur: Safety/medical concerns       Blood pressure (!) 118/55, pulse 71, temperature 98.5 F (36.9 C), temperature source Oral, resp. rate 17, height 5\' 6"  (1.676 m), weight 95.4 kg, SpO2 96 %.    Medical Problem List and Plan: 1.Right upper and lower extremity weaknesssecondary to cervical myelopathy.S/Pdiscectomy C5-6, 6-7 decompression placement of anterior instrumentation interbody plate and screws arthrodesis 09/09/2019. Cervical collar when out of bed -patient may shower -ELOS/Goals: 10-14d, supervision with mobility, min assist showering and lower extremity dressing mod I/supervision upper limb ADL 2. Antithrombotics: -DVT/anticoagulation:SCDs. Check vascular study 11/24- Dopplers (-) however has been since 11/19 since surgery- will see if can get Lovenox started  11/25- started lovenox- OK'd by [facility].  -antiplatelet therapy: N/A 3. Pain Management:Oxycodone and Robaxin as needed  11/24- will start Gabapentin 300 mg BID for nerve pain and titrate up as required 4. Mood:Provide emotional support -antipsychotic agents: N/A 5. Neuropsych: This patientiscapable of making decisions on herown behalf. 6. Skin/Wound Care:Routine skin checks 7. Fluids/Electrolytes/Nutrition:Routine in and  outs with follow-up chemistries 8. 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 Flomax 0.4 mg daily. Check PVR  11/24- is requiring in/out caths- will cont' flomax and see if gets return  11/25- increase Flomax to 0.8 mg nightly for bladder emptying. 9. Constipation, likely due to Neurogenic bowel. Laxative assistance  11/24- will see how does, but likely will need bowel program.  10. Hypokalemia  11/124- will replete since K+ is 3.3- replete with 40 mEq x2 today and recheck later this week.   LOS: 2 days A FACE TO FACE EVALUATION WAS PERFORMED  Leo Fray 09/15/2019, 8:20 AM

## 2019-09-15 NOTE — Progress Notes (Signed)
Social Work Assessment and Plan   Patient Details  Name: Sandra Parrish MRN: [id number] Date of Birth: 1946/08/10  Today's Date: 09/15/2019  Problem List:  Patient Active Problem List   Diagnosis Date Noted  . Neurogenic bladder 09/14/2019  . Cervical myelopathy ([place]) 09/13/2019  . Cervical spinal stenosis   . Bradycardia   . Acute blood loss anemia   . Urinary retention   . Postoperative pain   . Drug induced constipation   . Stenosis of cervical spine with myelopathy ([place]) 09/07/2019   Past Medical History: History reviewed. No pertinent past medical history. Past Surgical History:  Past Surgical History:  Procedure Laterality Date  . ANTERIOR CERVICAL DECOMP/DISCECTOMY FUSION N/A 09/09/2019   Procedure: ANTERIOR CERVICAL DECOMPRESSION/DISCECTOMY FUSION CERVICAL FIVE- CERVICAL SIX, CERVICAL SIX- CERVICAL SEVEN;  Surgeon: Consuella Lose, MD;  Location: [facility];  Service: Neurosurgery;  Laterality: N/A;  ANTERIOR CERVICAL DECOMPRESSION/DISCECTOMY FUSION CERVICAL FIVE- CERVICAL SIX, CERVICAL SIX- CERVICAL SEVEN   Social History:  reports that she has never smoked. She has never used smokeless tobacco. No history on file for alcohol and drug.  Family / Support Systems Marital Status: Divorced How Long?: ~ 30+ yrs Patient Roles: Other (Comment)(friends) Children: none Other Supports: friend, Suezanne Cheshire @ (C) [phone];  friend, Joycelyn Schmid @ [phone] Anticipated Caregiver: friends PRN  Ability/Limitations of Caregiver: Friends can only check in on pt and may not be able to do this everyday but they are involved. Caregiver Availability: Intermittent Family Dynamics: Pt has family in [place] but no one locally and no children.  Social History Preferred language: English Religion:  Cultural Background: NA Read: Yes Write: Yes Employment Status: Retired Public relations account executive Issues: none Guardian/Conservator: none - per MD, pt is capable of making decisions on  her own behalf.   Abuse/Neglect Abuse/Neglect Assessment Can Be Completed: Yes Physical Abuse: Denies Verbal Abuse: Denies Sexual Abuse: Denies Exploitation of patient/patient's resources: Denies Self-Neglect: Denies  Emotional Status Pt's affect, behavior and adjustment status: Pt lying in bed and very pleasant, humorous.  Completes assessment interview without any difficulty.  Tells the experience of her fall at home and "the helpless feeling" of having to yell from her balcony for help.  She admits much frustration with the ER d/c and the [facility] transport home but is "grateful" she returned to hospital where MRI was done and injury found/ surgery completed.  She denies any significant emotional distress, however, may benefit from neuropsychology consult while here dependent on recovery. Recent Psychosocial Issues: None Psychiatric History: None Substance Abuse History: None  Patient / Family Perceptions, Expectations & Goals Pt/Family understanding of illness & functional limitations: Pt and friends with good understanding of injury, surgery and current functional limitations/ need for CIR. Premorbid pt/family roles/activities: completely independent Anticipated changes in roles/activities/participation: Per goals of minimal assistance, concern about having only intermittent support available. Pt/family expectations/goals: Pt goals are to be able to exceed goals and reach a safe, functional level where intermittent support of friends will be adquate.  Community Resources Express Scripts: None Premorbid Home Care/DME Agencies: None Transportation available at discharge: yes Resource referrals recommended: Neuropsychology  Discharge Planning Living Arrangements: Alone Support Systems: Friends/neighbors Type of Residence: Private residence Insurance Resources: Medicare(Part A only) Museum/gallery curator Resources: Radio broadcast assistant Screen Referred: No Living Expenses: Education officer, community  Management: Patient Does the patient have any problems obtaining your medications?: No Home Management: pt was independent Patient/Family Preliminary Plans: As noted, pt hopes to be able to d/c to her condo with intermittent support  of friends.  We did discuss short term option of SNF if needed. [place] Barriers to Discharge: Lack of/limited family support Social Work Anticipated Follow Up Needs: HH/OP Expected length of stay: 3 weeks  Clinical Impression Very pleasant woman here following a fall at home where she lives alone.  Suffered cervical SCI, underwent surgery and now on CIR and hopeful she might be able to reach a mod ind goal.  Notes she has no family here but does have close friends who can provide intermittent assistance.  Goals currently are for min assist overall. Pt aware of goals and have educated her on d/c option of short term SNF if needed.  Mood appears stable but will monitor throughout stay.  Sema Stangler 09/15/2019, 3:46 PM

## 2019-09-15 NOTE — Progress Notes (Signed)
Pt slept well throughout night without issue. Able to urinate on her own without residual overnight.

## 2019-09-15 NOTE — Consult Note (Signed)
Neuropsychological Consultation   Patient:   Sandra Parrish   DOB:   April 24, 1946  MR Number:  [id number]  Location:  [facility] [address] CENTER [address] [place] [id number] [place] [address] Dept: [place]: [phone]           Date of Service:   09/15/2019  Start Time:   9 AM End Time:   10 AM  Provider/Observer:  Ilean Skill, Psy.D.       Clinical Neuropsychologist       Billing Code/Service: 58527  Chief Complaint:    Sandra Parrish is a 81 female with unremarkable past medical history.  Presented 09/07/2019 after a fall without loss of consciousness and progressive right upper extremity and right lower extremity weakness and tingling.  Initially presented to [facility], with CT scan and discharged home.  Next day returned to [facility] and MRI showed issues with cervical spine.  MRI showed abnormal cord edema vs. Myelomalacia extending from C4-5 through the mid C7 level associated with severe central narrowing of the thecal sac at C5-6 and moderate narrowing C6-7.  Patient underwent discectomy at C5-7 for decompression of spinal cord and existing nerve root placement of anterior instrumentation consisting of interbody plate and screws.  Patient with continued weakness in right side motor function.  Reason for Service:  Patient referred for neuropsychological consultation due to copinga and adjustment issues.  Below is the HPI for the current admission.   POE:[id number] a 73 year old right-handed female with unremarkable past medical history. Per chart review and patient, patient lives alone. She was independent prior to admission. She does not have any family in the area but does have a neighbor and some friends. 1 level home 6 steps to entry. Presented 09/07/2019 after a fall without loss of consciousness and progressive right upper extremity and right lower extremity weakness and tingling. Admission  chemistries unremarkable, SARS coronavirus negative, hemoglobin 11.6. Cranial CT scan unremarkable for acute intracranial process. There was a midline frontal scalp swelling contusion with small hematoma measuring 4 mm in maximal thickness. CT/MRI cervical spine showed abnormal cord edema versus myelomalacia extending from C4-5 through the mid C7 level associated with severe central narrowing of the thecal sac at C5-6 and moderate central narrowing C6-7 with myelopathy. Underwent discectomy at C5-6, 6-7 for decompression of spinal cord and existing nerve root placement of anterior instrumentation consisting of interbody plate and screws arthrodesis on 09/09/2019 per Dr. Kathyrn Sheriff. Hospital course complicated by pain as well as bouts of urinary retention with initially Foley catheter tube placed maintained on Flomax. Cervical collar when out of bed. Tolerating a regular diet. Therapy evaluations completed and patient was admitted for a comprehensive rehab program.  Current Status:  Patient reports that mood is good and with exception of time after first discharge from ED, where the patient was very worried and unable to get into her home without significant help, she has been doing well.  She reports that she is seeing improvement in motor functions and making gains in therapy.  Cognition is good.    Behavioral Observation: Sandra Parrish  presents as a 73 y.o.-year-old Right Caucasian Female who appeared her stated age. her dress was Appropriate and she was Well Groomed and her manners were Appropriate to the situation.  her participation was indicative of Appropriate and Attentive behaviors.  There were any physical disabilities noted.  she displayed an appropriate level of cooperation and motivation.     Interactions:  Active Appropriate and Attentive  Attention:   within normal limits and attention span and concentration were age appropriate  Memory:   within normal limits; recent and remote  memory intact  Visuo-spatial:  not examined  Speech (Volume):  normal  Speech:   normal; normal  Thought Process:  Coherent and Relevant  Though Content:  WNL; not suicidal and not homicidal  Orientation:   person, place, time/date and situation  Judgment:   Good  Planning:   Good  Affect:    Irritable  Mood:    Irritable  Insight:   Good  Intelligence:   high  Medical History:  History reviewed. No pertinent past medical history.  Psychiatric History:  Patient denies prior psychiatric history.  Family Med/Psych History: History reviewed. No pertinent family history.  Impression/DX:  Sandra Parrish is a 53 female with unremarkable past medical history.  Presented 09/07/2019 after a fall without loss of consciousness and progressive right upper extremity and right lower extremity weakness and tingling.  Initially presented to [facility], with CT scan and discharged home.  Next day returned to [facility] and MRI showed issues with cervical spine.  MRI showed abnormal cord edema vs. Myelomalacia extending from C4-5 through the mid C7 level associated with severe central narrowing of the thecal sac at C5-6 and moderate narrowing C6-7.  Patient underwent discectomy at C5-7 for decompression of spinal cord and existing nerve root placement of anterior instrumentation consisting of interbody plate and screws.  Patient with continued weakness in right side motor function.  Patient reports that mood is good and with exception of time after first discharge from ED, where the patient was very worried and unable to get into her home without significant help, she has been doing well.  She reports that she is seeing improvement in motor functions and making gains in therapy.  Cognition is good.     Disposition/Plan:  Worked on coping and adjustment issues.  Will follow-up if needed next week.        Electronically Signed   _______________________ Arley Phenix, Psy.D.

## 2019-09-16 ENCOUNTER — Inpatient Hospital Stay (HOSPITAL_COMMUNITY): Payer: Self-pay

## 2019-09-16 NOTE — Progress Notes (Signed)
[facility] PHYSICAL MEDICINE & REHABILITATION PROGRESS NOTE   Subjective/Complaints:   Pt reports voided just now, but didn't fully empty- wants to try and void again prior to a cath again- Also had BM on her own with no suppository last night- also voided some at same time. "got in right position".  Nerve pain a little to somewhat better-neck pain also doing better overall.    ROS- denies CP, SOB, N/V/D  Objective:   No results found. Recent Labs    09/14/19 0419  WBC 8.5  HGB 11.6*  HCT 35.4*  PLT 308   Recent Labs    09/14/19 0419  NA 136  K 3.3*  CL 101  CO2 26  GLUCOSE 118*  BUN 12  CREATININE 0.61  CALCIUM 8.2*    Intake/Output Summary (Last 24 hours) at 09/16/2019 6195 Last data filed at 09/16/2019 0200 Gross per 24 hour  Intake 436 ml  Output 325 ml  Net 111 ml     Physical Exam: Vital Signs Blood pressure (!) 128/59, pulse 71, temperature 97.9 F (36.6 C), resp. rate 16, height 5\' 6"  (1.676 m), weight 95.4 kg, SpO2 98 %.  Physical Exam Vitals  reviewed General: No acute distress; sitting up in bed;finishing breakfast; appropriate, still slightly anxious; rambles slightlyNAD Mood and affect are appropriate except slightly anxious Heart: Regular rate and rhythm Lungs: Clear to auscultation, breathing unlabored, no rales or wheezes Abdomen: Positive bowel sounds, soft nontender to palpation, nondistended Extremities: No clubbing, cyanosis, or edema Skin:, honeycomb dressing over the right anterior lower cervical incision. Neurologic: Alert and oriented x3, motor strength is 3 - in the right deltoid 3 - in the biceps triceps finger flexors and extensors Left upper extremity is 4 - at the deltoid bicep tricep finger flexors and extensors 4/5 in the hip flexors knee extensors ankle dorsiflexors plantar flexors bilaterally  lower extremities Fine motor is reduced bilateral hands with finger to thumb opposition. Musculoskeletal: good ROM Neck range  of motion not tested specifically, but has decreased cervical flexion/rotation/side bending seen on exam    Assessment/Plan: 1. Functional deficits secondary to Cervical myelopathy s/p C5/6 discectomy, C6-7 decompression and plates and screws arthrodesis  to  which require 3+ hours per day of interdisciplinary therapy in a comprehensive inpatient rehab setting.  Physiatrist is providing close team supervision and 24 hour management of active medical problems listed below.  Physiatrist and rehab team continue to assess barriers to discharge/monitor patient progress toward functional and medical goals  Care Tool:  Bathing    Body parts bathed by patient: Right arm, Left arm, Chest, Abdomen, Face   Body parts bathed by helper: Front perineal area, Buttocks, Right upper leg, Left upper leg, Right lower leg, Left lower leg     Bathing assist Assist Level: Maximal Assistance - Patient 24 - 49%     Upper Body Dressing/Undressing Upper body dressing   What is the patient wearing?: Pull over shirt, Dress    Upper body assist Assist Level: Moderate Assistance - Patient 50 - 74%    Lower Body Dressing/Undressing Lower body dressing      What is the patient wearing?: Incontinence brief     Lower body assist Assist for lower body dressing: Dependent - Patient 0%     Toileting Toileting    Toileting assist Assist for toileting: 2 Helpers     Transfers Chair/bed transfer  Transfers assist     Chair/bed transfer assist level: Dependent - mechanical lift(stedy)  Locomotion Ambulation   Ambulation assist   Ambulation activity did not occur: Safety/medical concerns          Walk 10 feet activity   Assist  Walk 10 feet activity did not occur: Safety/medical concerns        Walk 50 feet activity   Assist Walk 50 feet with 2 turns activity did not occur: Safety/medical concerns         Walk 150 feet activity   Assist Walk 150 feet activity did not  occur: Safety/medical concerns         Walk 10 feet on uneven surface  activity   Assist Walk 10 feet on uneven surfaces activity did not occur: Safety/medical concerns         Wheelchair     Assist Will patient use wheelchair at discharge?: Yes Type of Wheelchair: Manual Wheelchair activity did not occur: Safety/medical concerns         Wheelchair 50 feet with 2 turns activity    Assist    Wheelchair 50 feet with 2 turns activity did not occur: Safety/medical concerns       Wheelchair 150 feet activity     Assist  Wheelchair 150 feet activity did not occur: Safety/medical concerns       Blood pressure (!) 128/59, pulse 71, temperature 97.9 F (36.6 C), resp. rate 16, height 5\' 6"  (1.676 m), weight 95.4 kg, SpO2 98 %.    Medical Problem List and Plan: 1.Right upper and lower extremity weaknesssecondary to cervical myelopathy.S/Pdiscectomy C5-6, 6-7 decompression placement of anterior instrumentation interbody plate and screws arthrodesis 09/09/2019. Cervical collar when out of bed -patient may shower -ELOS/Goals: 10-14d, supervision with mobility, min assist showering and lower extremity dressing mod I/supervision upper limb ADL 2. Antithrombotics: -DVT/anticoagulation:SCDs. Check vascular study 11/24- Dopplers (-) however has been since 11/19 since surgery- will see if can get Lovenox started  11/25- started lovenox- OK'd by [facility].  -antiplatelet therapy: N/A 3. Pain Management:Oxycodone and Robaxin as needed  11/24- will start Gabapentin 300 mg BID for nerve pain and titrate up as required  11/26- helping nerve pain 4. Mood:Provide emotional support -antipsychotic agents: N/A 5. Neuropsych: This patientiscapable of making decisions on herown behalf. 6. Skin/Wound Care:Routine skin checks 7. Fluids/Electrolytes/Nutrition:Routine in and outs with follow-up chemistries 8. Urinary  retention due to neurogenic bladder. Flomax 0.4 mg daily. Check PVR  11/24- is requiring in/out caths- will cont' flomax and see if gets return  11/25- increase Flomax to 0.8 mg nightly for bladder emptying.  11/26- still intermittently needing cathed. 9. Constipation, likely due to Neurogenic bowel. Laxative assistance  11/24- will see how does, but likely will need bowel program.   11/26- so far, is going- not sure if emptying 10. Hypokalemia  11/24- will replete since K+ is 3.3- replete with 40 mEq x2 today and recheck later this week.  11/26- recheck in AM and then order weekly labs.   LOS: 3 days A FACE TO FACE EVALUATION WAS PERFORMED  Sandra Parrish 09/16/2019, 9:21 AM

## 2019-09-16 NOTE — IPOC Note (Signed)
Overall Plan of Care [facility]) Patient Details Name: Sandra Parrish MRN: [id number] DOB: [birth year]  Admitting Diagnosis: Cervical myelopathy [facility])  Hospital Problems: Principal Problem:   Cervical myelopathy ([place]) Active Problems:   Cervical spinal stenosis   Urinary retention   Drug induced constipation   Neurogenic bladder     Functional Problem List: Nursing Bladder, Edema, Bowel, Motor, Pain, Safety, Endurance, Skin Integrity  PT Balance, Endurance, Motor, Pain, Safety, Sensory  OT Balance, Endurance, Motor, Pain, Safety  SLP    TR         Basic ADL's: OT Grooming, Bathing, Dressing, Toileting     Advanced  ADL's: OT       Transfers: PT Bed Mobility, Bed to Chair, Car, Furniture, Floor  OT Toilet, Metallurgist: PT Ambulation, Emergency planning/management officer, Stairs     Additional Impairments: OT None  SLP        TR      Anticipated Outcomes Item Anticipated Outcome  Self Feeding set up A  Swallowing      Basic self-care  min A  Toileting  min A   Bathroom Transfers min A  Bowel/Bladder  manage bowel and bladder with min assist  Transfers  min A  Locomotion  min A at w/c level  Communication     Cognition     Pain  pain level less than 4 on scale f 0-10  Safety/Judgment  remain free of injury, prevent falls with cues and reminders   Therapy Plan: PT Intensity: Minimum of 1-2 x/day ,45 to 90 minutes PT Frequency: 5 out of 7 days PT Duration Estimated Length of Stay: 21-24 days OT Intensity: Minimum of 1-2 x/day, 45 to 90 minutes OT Frequency: 5 out of 7 days OT Duration/Estimated Length of Stay: 3 weeks     Due to the current state of emergency, patients may not be receiving their 3-hours of Medicare-mandated therapy.   Team Interventions: Nursing Interventions Patient/Family Education, Pain Management, Bladder Management, Bowel Management, Skin Care/Wound Management, Disease Management/Prevention, Psychosocial Support  PT  interventions Ambulation/gait training, Training and development officer, Community reintegration, Discharge planning, DME/adaptive equipment instruction, Functional electrical stimulation, Functional mobility training, Neuromuscular re-education, Pain management, Patient/family education, Psychosocial support, Splinting/orthotics, Stair training, Therapeutic Activities, Therapeutic Exercise, UE/LE Strength taining/ROM, UE/LE Coordination activities, Wheelchair propulsion/positioning  OT Interventions Training and development officer, Engineer, drilling, Patient/family education, Therapeutic Activities, Wheelchair propulsion/positioning, Cognitive remediation/compensation, Psychosocial support, Therapeutic Exercise, Community reintegration, Functional mobility training, UE/LE Strength taining/ROM, Self Care/advanced ADL retraining, Discharge planning, Neuromuscular re-education, UE/LE Coordination activities, Pain management  SLP Interventions    TR Interventions    SW/CM Interventions Discharge Planning, Psychosocial Support, Patient/Family Education   Barriers to Discharge MD  Medical stability, Incontinence, Neurogenic bowel and bladder, Wound care, Weight and incomplete cervical myelopathy  Nursing      PT Inaccessible home environment, Decreased caregiver support, Home environment access/layout, Lack of/limited family support    OT Decreased caregiver support, Home environment access/layout    SLP      SW       Team Discharge Planning: Destination: PT-Home ,OT- Home , SLP-  Projected Follow-up: PT-Home health PT, OT-  24 hour supervision/assistance, Home health OT, SLP-  Projected Equipment Needs: PT-Wheelchair (measurements), Wheelchair cushion (measurements), Rolling walker with 5" wheels, OT- To be determined, SLP-  Equipment Details: PT-TBD pending progress, OT-  Patient/family involved in discharge planning: PT- Patient,  OT-Patient, SLP-   MD ELOS: 21-24 days Medical Rehab  Prognosis:  Good Assessment:  Patient is  a 73 yr old female with incomplete cervical myelopathy s/p discectomy at C5-6 and C6-7 decompression and instrumentation/arthrodesis with incomplete tetraplegia, neurogenic bowel and bladder, and nerve pain as a result- working on controlled nerve pain and trying to get pt voiding with Flomax- bladder program- haven't started bowel program as of yet. Might still need one.  Goals min assist- however pt was living alone with no close family nearby- so will need to see what overall plan is.    See Team Conference Notes for weekly updates to the plan of care

## 2019-09-16 NOTE — Progress Notes (Signed)
Pt with sensation to void, taken to BR- 2 assist with STEDY lift, able to void moderate amount into commode.  PVR is 304, cathed total of 325.

## 2019-09-17 ENCOUNTER — Inpatient Hospital Stay (HOSPITAL_COMMUNITY): Payer: Self-pay

## 2019-09-17 ENCOUNTER — Inpatient Hospital Stay (HOSPITAL_COMMUNITY): Payer: Self-pay | Admitting: Occupational Therapy

## 2019-09-17 LAB — BASIC METABOLIC PANEL
Anion gap: 12 (ref 5–15)
BUN: 13 mg/dL (ref 8–23)
CO2: 27 mmol/L (ref 22–32)
Calcium: 8.9 mg/dL (ref 8.9–10.3)
Chloride: 101 mmol/L (ref 98–111)
Creatinine, Ser: 0.64 mg/dL (ref 0.44–1.00)
GFR calc Af Amer: >60 mL/min (ref >60)
GFR calc non Af Amer: >60 mL/min (ref >60)
Glucose, Bld: 114 mg/dL — ABNORMAL HIGH (ref 70–99)
Potassium: 4.2 mmol/L (ref 3.5–5.1)
Sodium: 140 mmol/L (ref 135–145)

## 2019-09-17 MED ORDER — BISACODYL 10 MG RE SUPP
10.0000 mg | Freq: Once | RECTAL | Status: AC
  Administered 2019-09-17: 10 mg via RECTAL
  Filled 2019-09-17: qty 1

## 2019-09-17 NOTE — Progress Notes (Signed)
[facility] PHYSICAL MEDICINE & REHABILITATION PROGRESS NOTE   Subjective/Complaints:   Pt reports voiding better- peeing/more success- LBM last night- took "forever" to go a small amount- willing to try Suppository tonight if would help her have bigger BM/go faster.  Pain better overall unless pulls/pushes too hard.  Passing gas still.  ROS- denies CP, SOB, N/V/D  Objective:   No results found. No results for input(s): WBC, HGB, HCT, PLT in the last 72 hours. Recent Labs    09/17/19 0528  NA 140  K 4.2  CL 101  CO2 27  GLUCOSE 114*  BUN 13  CREATININE 0.64  CALCIUM 8.9    Intake/Output Summary (Last 24 hours) at 09/17/2019 0929 Last data filed at 09/16/2019 1800 Gross per 24 hour  Intake 240 ml  Output -  Net 240 ml     Physical Exam: Vital Signs Blood pressure 108/63, pulse 73, temperature 97.6 F (36.4 C), resp. rate 19, height 5\' 6"  (1.676 m), weight 95.4 kg, SpO2 99 %.  Physical Exam Vitals  reviewed General: No acute distress; sitting up in bed;finishing breakfast; appropriate, still slightly anxious; brighter affect; NAD Mood and affect are appropriate except slightly anxious Heart: Regular rate and rhythm Lungs: Clear to auscultation B/L Abdomen: Positive bowel sounds, soft nontender to palpation, nondistended Extremities: No clubbing, cyanosis, or edema Skin:, honeycomb dressing over the right anterior lower cervical incision. Neurologic: Alert and oriented x3, motor strength is 3 - in the right deltoid 3 - in the biceps triceps finger flexors and extensors Left upper extremity is 4 - at the deltoid bicep tricep finger flexors and extensors 4/5 in the hip flexors knee extensors ankle dorsiflexors plantar flexors bilaterally  lower extremities Fine motor is reduced bilateral hands with finger to thumb opposition. Musculoskeletal: good ROM Neck range of motion not tested specifically, but has decreased cervical flexion/rotation/side bending seen on  exam    Assessment/Plan: 1. Functional deficits secondary to Cervical myelopathy s/p C5/6 discectomy, C6-7 decompression and plates and screws arthrodesis  to  which require 3+ hours per day of interdisciplinary therapy in a comprehensive inpatient rehab setting.  Physiatrist is providing close team supervision and 24 hour management of active medical problems listed below.  Physiatrist and rehab team continue to assess barriers to discharge/monitor patient progress toward functional and medical goals  Care Tool:  Bathing    Body parts bathed by patient: Face   Body parts bathed by helper: Right arm, Left arm, Chest, Abdomen, Front perineal area, Buttocks, Right upper leg, Right lower leg, Left upper leg, Left lower leg     Bathing assist Assist Level: Maximal Assistance - Patient 24 - 49%     Upper Body Dressing/Undressing Upper body dressing   What is the patient wearing?: Pull over shirt    Upper body assist Assist Level: Maximal Assistance - Patient 25 - 49%    Lower Body Dressing/Undressing Lower body dressing      What is the patient wearing?: Incontinence brief     Lower body assist Assist for lower body dressing: Dependent - Patient 0%     Toileting Toileting    Toileting assist Assist for toileting: 2 Helpers     Transfers Chair/bed transfer  Transfers assist     Chair/bed transfer assist level: Dependent - mechanical lift(stedy)     Locomotion Ambulation   Ambulation assist   Ambulation activity did not occur: Safety/medical concerns          Walk 10 feet activity  Assist  Walk 10 feet activity did not occur: Safety/medical concerns        Walk 50 feet activity   Assist Walk 50 feet with 2 turns activity did not occur: Safety/medical concerns         Walk 150 feet activity   Assist Walk 150 feet activity did not occur: Safety/medical concerns         Walk 10 feet on uneven surface  activity   Assist Walk 10  feet on uneven surfaces activity did not occur: Safety/medical concerns         Wheelchair     Assist Will patient use wheelchair at discharge?: Yes Type of Wheelchair: Manual Wheelchair activity did not occur: Safety/medical concerns         Wheelchair 50 feet with 2 turns activity    Assist    Wheelchair 50 feet with 2 turns activity did not occur: Safety/medical concerns       Wheelchair 150 feet activity     Assist  Wheelchair 150 feet activity did not occur: Safety/medical concerns       Blood pressure 108/63, pulse 73, temperature 97.6 F (36.4 C), resp. rate 19, height 5\' 6"  (1.676 m), weight 95.4 kg, SpO2 99 %.    Medical Problem List and Plan: 1.Right upper and lower extremity weaknesssecondary to cervical myelopathy.S/Pdiscectomy C5-6, 6-7 decompression placement of anterior instrumentation interbody plate and screws arthrodesis 09/09/2019. Cervical collar when out of bed -patient may shower -ELOS/Goals: 10-14d, supervision with mobility, min assist showering and lower extremity dressing mod I/supervision upper limb ADL 2. Antithrombotics: -DVT/anticoagulation:SCDs. Check vascular study 11/24- Dopplers (-) however has been since 11/19 since surgery- will see if can get Lovenox started  11/25- started lovenox- OK'd by [facility].  -antiplatelet therapy: N/A 3. Pain Management:Oxycodone and Robaxin as needed  11/24- will start Gabapentin 300 mg BID for nerve pain and titrate up as required  11/26- helping nerve pain 4. Mood:Provide emotional support -antipsychotic agents: N/A 5. Neuropsych: This patientiscapable of making decisions on herown behalf. 6. Skin/Wound Care:Routine skin checks 7. Fluids/Electrolytes/Nutrition:Routine in and outs with follow-up chemistries 8. 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 Flomax 0.4 mg daily. Check PVR  11/24- is requiring in/out caths- will  cont' flomax and see if gets return  11/25- increase Flomax to 0.8 mg nightly for bladder emptying.  11/26- still intermittently needing cathed.  11/27- voiding better 9. Constipation, likely due to Neurogenic bowel. Laxative assistance  11/24- will see how does, but likely will need bowel program.   11/26- so far, is going- not sure if emptying 10. Hypokalemia  11/24- will replete since K+ is 3.3- replete with 40 mEq x2 today and recheck later this week.  11/26- recheck in AM and then order weekly labs.  1127- K+ 4.2   LOS: 4 days A FACE TO FACE EVALUATION WAS PERFORMED  Icela Glymph 09/17/2019, 9:29 AM

## 2019-09-17 NOTE — Progress Notes (Signed)
Occupational Therapy Session Note  Patient Details  Name: Sandra Parrish MRN: [id number] Date of Birth: 03/13/1946  Today's Date: 09/17/2019 OT Individual Time: 1000-1100 OT Individual Time Calculation (min): 60 min    Short Term Goals: Week 1:  OT Short Term Goal 1 (Week 1): Pt will perform toilet transfer with assistance of 1 person. OT Short Term Goal 2 (Week 1): Pt will perform UB self care while seated unsupported with min guard. OT Short Term Goal 3 (Week 1): Pt will perform LB dressing with max A overall.  Skilled Therapeutic Interventions/Progress Updates:    Pt resting in bed upon arrival and agreeable to therapy.  OT intervention with focus on bed mobility, sitting balance, sit<>stand, standing balance, functional tranfsers, activity tolerance, and safety awareness to increase independence with BADLs. Supine>sit EOB with min A.  Sitting balance with supervision.  Sit<>stand from EOB with min A +2. Pt requires max verbal cues for sequencing. Sit<>stand X 3 3 musketeers.  Sit<>stand X 3 with [place] walker. Pt practiced advancing LLE and RLE alternating while standing in [place] walker.  Pt practiced moving RLE and LLE laterally and medially for respositioning.  [place] placed on hair brush and red foam to facilitate increased function using RUE for brushing hair and self feeding.  Pt performed scoot/squat transfer to w/c with mod A. Pt remained in w/c with all needs within reach.   Therapy Documentation Precautions:  Precautions Precautions: Fall, Cervical Precaution Comments: reviewed cervical precautions Cervical Brace: Soft collar Restrictions Weight Bearing Restrictions: No    Pain:  Pt denies pain this morning   Therapy/Group: Individual Therapy  Leroy Libman 09/17/2019, 12:22 PM

## 2019-09-17 NOTE — Plan of Care (Signed)
  Problem: Consults Goal: RH SPINAL CORD INJURY PATIENT EDUCATION Description:  See Patient Education module for education specifics.  Outcome: Progressing   Problem: SCI BOWEL ELIMINATION Goal: RH STG MANAGE BOWEL WITH ASSISTANCE Description: STG Manage Bowel with min Assistance. Outcome: Progressing Goal: RH STG SCI MANAGE BOWEL WITH MEDICATION WITH ASSISTANCE Description: STG SCI Manage bowel with medication with min assistance. Outcome: Progressing Goal: RH STG SCI MANAGE BOWEL PROGRAM W/ASSIST OR AS APPROPRIATE Description: STG SCI Manage bowel program with min assist or as appropriate. Outcome: Progressing   Problem: SCI BLADDER ELIMINATION Goal: RH OTHER STG BLADDER ELIMINATION GOALS W/ASSIST Description: Other STG Bladder Elimination Goals With min Assistance Outcome: Progressing   Problem: RH SAFETY Goal: RH STG ADHERE TO SAFETY PRECAUTIONS W/ASSISTANCE/DEVICE Description: STG Adhere to Safety Precautions With cues and reminders Outcome: Progressing   Problem: RH PAIN MANAGEMENT Goal: RH STG PAIN MANAGED AT OR BELOW PT'S PAIN GOAL Description: Pain level less than 4 on scale of 0-10 Outcome: Progressing   Problem: RH KNOWLEDGE DEFICIT SCI Goal: RH STG INCREASE KNOWLEDGE OF SELF CARE AFTER SCI Description: Pt will be able to demonstrate understanding of fall prevention and proper steps to call for help in case of emergency independently upon discharge. Pt will be able to identify and eliminate environmental hazards that can lead to pt's falling independently.  Outcome: Progressing   

## 2019-09-17 NOTE — Progress Notes (Signed)
Occupational Therapy Session Note  Patient Details  Name: Sandra Parrish MRN: [id number] Date of Birth: 04-Jul-1946  Today's Date: 09/17/2019 OT Individual Time: 0700-0803 OT Individual Time Calculation (min): 63 min    Short Term Goals: Week 1:  OT Short Term Goal 1 (Week 1): Pt will perform toilet transfer with assistance of 1 person. OT Short Term Goal 2 (Week 1): Pt will perform UB self care while seated unsupported with min guard. OT Short Term Goal 3 (Week 1): Pt will perform LB dressing with max A overall.  Skilled Therapeutic Interventions/Progress Updates:    1:1. Pain 4/10 in neck. Soft sollar applied for comfort. MAX A supine>sitting with VC for log rolling using bed rails. No dizziness EOB. MIN A sitting balance fading to S. Pt completes stedy transfer from elevated surface with MOD of 1 and manual facilitation of weight shifting. Pt bathes UB at sink with A to wash back and A to wash buttocks, peri area and feet at sit to stand in stedy. Pt requires MAX A from low surface. Pt very fearful of falling in stedy despite encouragemetn and reassurance. Pt able to thread BLE into pants after OT dons brief. Pt transfers onto toilet in stedy with MAX A to stand from w/c and BSC after unsuccessful void attempt. Exited session with pt seated in bed, exit alarm on and call light itn reach  Therapy Documentation Precautions:  Precautions Precautions: Fall, Cervical Precaution Comments: reviewed cervical precautions Cervical Brace: Soft collar Restrictions Weight Bearing Restrictions: (P) No General:   Vital Signs:  Therapy Vitals Temp: 97.6 F (36.4 C) Pulse Rate: 73 Resp: 19 BP: 108/63 Patient Position (if appropriate): Lying Oxygen Therapy SpO2: 99 % O2 Device: Room Air Pain:   ADL:   Vision   Perception    Praxis   Exercises:   Other Treatments:     Therapy/Group: Individual Therapy  Tonny Branch 09/17/2019, 6:57 AM

## 2019-09-17 NOTE — Progress Notes (Signed)
Dulcolax suppository given at 1755. Pt expelled small amount of mushy stool after a dig-stim at 1845. Pt also required I&O cath this evening. Pt tolerated well. Continue plan of care.  Gerald Stabs, RN

## 2019-09-17 NOTE — Progress Notes (Signed)
Physical Therapy Session Note  Patient Details  Name: Sandra Parrish MRN: [id number] Date of Birth: 04/06/1946  Today's Date: 09/17/2019 PT Individual Time: 5329-9242 PT Individual Time Calculation (min): 70 min   Short Term Goals: Week 1:  PT Short Term Goal 1 (Week 1): Pt will initiate w/c mobility PT Short Term Goal 2 (Week 1): Pt will perform least restrictive transfer with assist x 1 PT Short Term Goal 3 (Week 1): Pt will perform bed mobility with mod A  Skilled Therapeutic Interventions/Progress Updates:    Pt supine in bed upon PT arrival, agreeable to therapy tx and reports pain 3/10 in UEs. Pt reports having to use the bathroom, pt transferred to sitting EOB with mod assist and cues for techniques. Donned soft collar. Pt performed ist<>stand within stedy with mod assist +2 for safety, transferred to toilet dependently with stedy lift. Pt maintained standing balance within the stedy with min assist +2 while therapist and tech helped with clothing management and pericare this session. Pt continent of bowel and bladder. Pt transferred to the w/c with stedy dependently. Pt transported to the gym in w/c. Pt performed x 2 sit<>stands this session from w/c with RW and mod-max assist +2, cues for hand placement and techniques to push up, in standing min-mod assist for balance with facilitation for hip extension, pt worked on static standing with RW and postural control. Pt performed sit<>stand 3 musketeers from w/c and worked on pre-gait, pt adducting L LE during step requiring cues for foot placement. Pt performed squat pivots this session w/c<>mat with max assist. Using the mirror for visual feedback pt performed x 2 sit<>stands 3 musketeers with max+2 assist, in standing working on postural control and pre-gait. Pt transported back to room, stedy transfer to bed. Pt seated EOB worked on sitting balance and postural control without UE support while weightshifting, min guard-min assist with cues for  increased trunk extension and anterior weightshift. Pt transferred to supine mod assist. In supine pt performed R LE active assisted hip flexion x 10. Pt left supine at end of session with needs in reach and bed alarm set.   Therapy Documentation Precautions:  Precautions Precautions: Fall, Cervical Precaution Comments: reviewed cervical precautions Cervical Brace: Soft collar Restrictions Weight Bearing Restrictions: No    Therapy/Group: Individual Therapy  Netta Corrigan, PT, DPT, CSRS 09/17/2019, 2:11 PM

## 2019-09-17 NOTE — Care Management (Signed)
Inpatient [facility] Individual Statement of Services  Patient Name:  [place]  Date:  09/17/2019  Welcome to the [facility].  Our goal is to provide you with an individualized program based on your diagnosis and situation, designed to meet your specific needs.  With this comprehensive rehabilitation program, you will be expected to participate in at least 3 hours of rehabilitation therapies Monday-Friday, with modified therapy programming on the weekends.  Your rehabilitation program will include the following services:  Physical Therapy (PT), Occupational Therapy (OT), 24 hour per day rehabilitation nursing, Therapeutic Recreaction (TR), Neuropsychology, Case Management (Social Worker), Rehabilitation Medicine, Nutrition Services and Pharmacy Services  Weekly team conferences will be held on Tuesdays to discuss your progress.  Your Social Worker will talk with you frequently to get your input and to update you on team discussions.  Team conferences with you and your family in attendance may also be held.  Expected length of stay: 3 weeks   Overall anticipated outcome: minimal assistance  Depending on your progress and recovery, your program may change. Your Social Worker will coordinate services and will keep you informed of any changes. Your Social Worker's name and contact numbers are listed  below.  The following services may also be recommended but are not provided by the [facility] will be made to provide these services after discharge if needed.  Arrangements include referral to agencies that provide these services.  Your insurance has been verified to be:  Medicare (Part A only) Your primary doctor is:  None  Pertinent information will be shared with your doctor and your insurance company.  Social Worker:   Rainelle, Clark or ([phone]   Information discussed with and copy given to patient by: Lennart Pall, 09/17/2019, 2:45 PM

## 2019-09-18 ENCOUNTER — Inpatient Hospital Stay (HOSPITAL_COMMUNITY): Payer: Self-pay

## 2019-09-18 NOTE — Progress Notes (Signed)
Occupational Therapy Session Note  Patient Details  Name: Sandra Parrish MRN: [id number] Date of Birth: 1946-04-12  Today's Date: 09/18/2019 OT Individual Time: 1300-1400 OT Individual Time Calculation (min): 60 min    Short Term Goals: Week 1:  OT Short Term Goal 1 (Week 1): Pt will perform toilet transfer with assistance of 1 person. OT Short Term Goal 2 (Week 1): Pt will perform UB self care while seated unsupported with min guard. OT Short Term Goal 3 (Week 1): Pt will perform LB dressing with max A overall.  Skilled Therapeutic Interventions/Progress Updates:    1:1. Pt received in bed with no report of pain. Pt agreeable to shower this date. Pt completes stedy transfer with MIN A sit to stand to transfer into padded tub bench with cut out. Pt completes bathing with A to wash buttocks and back only. Pt given LHSS to wash B feet. Pt completes dressing EOB with MOD A for LB dressing to don brief and pants. Pt sit to stand with MIN A in stedy and OT advances pants past hips. Pt completes UB dressing with S to don pull over shirt. Dynamic sitting balance waning to require min A by end of dressing. Exited session with pt seated in bed, exit alarm on and call light in reach  Therapy Documentation Precautions:  Precautions Precautions: Fall, Cervical Precaution Comments: reviewed cervical precautions Cervical Brace: Soft collar Restrictions Weight Bearing Restrictions: No General:   Vital Signs: Therapy Vitals Temp: 97.8 F (36.6 C) Pulse Rate: 79 Resp: 17 BP: 103/66 Patient Position (if appropriate): Lying Oxygen Therapy SpO2: 96 % O2 Device: Room Air Pain:   ADL:   Vision   Perception    Praxis   Exercises:   Other Treatments:     Therapy/Group: Individual Therapy  Tonny Branch 09/18/2019, 1:58 PM

## 2019-09-18 NOTE — Progress Notes (Signed)
[facility] PHYSICAL MEDICINE & REHABILITATION PROGRESS NOTE   Subjective/Complaints:   Pt reports they took off honeycomb dressing and incision has been real irritated, esp if wearing soft cervical collar-   Asking if can go without soft collar.  Abd discomfort better after multiple BMs after suppository last night- had good results over time.    ROS- denies CP, SOB, N/V/D  Objective:   No results found. No results for input(s): WBC, HGB, HCT, PLT in the last 72 hours. Recent Labs    09/17/19 0528  NA 140  K 4.2  CL 101  CO2 27  GLUCOSE 114*  BUN 13  CREATININE 0.64  CALCIUM 8.9    Intake/Output Summary (Last 24 hours) at 09/18/2019 1255 Last data filed at 09/18/2019 0700 Gross per 24 hour  Intake 520 ml  Output 650 ml  Net -130 ml     Physical Exam: Vital Signs Blood pressure 112/63, pulse 80, temperature 98.1 F (36.7 C), resp. rate 16, height 5\' 6"  (1.676 m), weight 95.4 kg, SpO2 94 %.  Physical Exam Vitals  reviewed General: No acute distress; sitting up on mat in gym; appropriate; NAD Mood and affect are appropriate except slightly anxious daily Heart: Regular rate and rhythm Lungs: Clear to auscultation B/L Abdomen: (+)BS, soft, NT, ND Extremities: No clubbing, cyanosis, or edema Skin:, honeycomb dressing over the right anterior lower cervical incision. Neurologic: Alert and oriented x3, motor strength is 3 - in the right deltoid 3 - in the biceps triceps finger flexors and extensors Left upper extremity is 4 - at the deltoid bicep tricep finger flexors and extensors 4/5 in the hip flexors knee extensors ankle dorsiflexors plantar flexors bilaterally  lower extremities Fine motor is reduced bilateral hands with finger to thumb opposition. Musculoskeletal: good ROM Neck range of motion not tested specifically, but has decreased cervical flexion/rotation/side bending seen on exam    Assessment/Plan: 1. Functional deficits secondary to Cervical  myelopathy s/p C5/6 discectomy, C6-7 decompression and plates and screws arthrodesis  to  which require 3+ hours per day of interdisciplinary therapy in a comprehensive inpatient rehab setting.  Physiatrist is providing close team supervision and 24 hour management of active medical problems listed below.  Physiatrist and rehab team continue to assess barriers to discharge/monitor patient progress toward functional and medical goals  Care Tool:  Bathing    Body parts bathed by patient: Face   Body parts bathed by helper: Right arm, Left arm, Chest, Abdomen, Front perineal area, Buttocks, Right upper leg, Right lower leg, Left upper leg, Left lower leg     Bathing assist Assist Level: Maximal Assistance - Patient 24 - 49%     Upper Body Dressing/Undressing Upper body dressing   What is the patient wearing?: Pull over shirt    Upper body assist Assist Level: Maximal Assistance - Patient 25 - 49%    Lower Body Dressing/Undressing Lower body dressing      What is the patient wearing?: Incontinence brief     Lower body assist Assist for lower body dressing: Dependent - Patient 0%     Toileting Toileting    Toileting assist Assist for toileting: 2 Helpers     Transfers Chair/bed transfer  Transfers assist     Chair/bed transfer assist level: 2 Helpers     Locomotion Ambulation   Ambulation assist   Ambulation activity did not occur: Safety/medical concerns          Walk 10 feet activity   Assist  Walk 10 feet activity did not occur: Safety/medical concerns        Walk 50 feet activity   Assist Walk 50 feet with 2 turns activity did not occur: Safety/medical concerns         Walk 150 feet activity   Assist Walk 150 feet activity did not occur: Safety/medical concerns         Walk 10 feet on uneven surface  activity   Assist Walk 10 feet on uneven surfaces activity did not occur: Safety/medical concerns          Wheelchair     Assist Will patient use wheelchair at discharge?: Yes Type of Wheelchair: Manual Wheelchair activity did not occur: Safety/medical concerns  Wheelchair assist level: Minimal Assistance - Patient > 75% Max wheelchair distance: 30    Wheelchair 50 feet with 2 turns activity    Assist    Wheelchair 50 feet with 2 turns activity did not occur: Safety/medical concerns       Wheelchair 150 feet activity     Assist  Wheelchair 150 feet activity did not occur: Safety/medical concerns       Blood pressure 112/63, pulse 80, temperature 98.1 F (36.7 C), resp. rate 16, height 5\' 6"  (1.676 m), weight 95.4 kg, SpO2 94 %.    Medical Problem List and Plan: 1.Right upper and lower extremity weaknesssecondary to cervical myelopathy.S/Pdiscectomy C5-6, 6-7 decompression placement of anterior instrumentation interbody plate and screws arthrodesis 09/09/2019. Cervical collar when out of bed -patient may shower -ELOS/Goals: 10-14d, supervision with mobility, min assist showering and lower extremity dressing mod I/supervision upper limb ADL 2. Antithrombotics: -DVT/anticoagulation:SCDs. Check vascular study 11/24- Dopplers (-) however has been since 11/19 since surgery- will see if can get Lovenox started  11/25- started lovenox- OK'd by [facility].  -antiplatelet therapy: N/A 3. Pain Management:Oxycodone and Robaxin as needed  11/24- will start Gabapentin 300 mg BID for nerve pain and titrate up as required  11/26- helping nerve pain 4. Mood:Provide emotional support -antipsychotic agents: N/A 5. Neuropsych: This patientiscapable of making decisions on herown behalf. 6. Skin/Wound Care:Routine skin checks 7. Fluids/Electrolytes/Nutrition:Routine in and outs with follow-up chemistries 8. 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 Flomax 0.4 mg daily. Check PVR  11/24- is requiring in/out caths- will  cont' flomax and see if gets return  11/25- increase Flomax to 0.8 mg nightly for bladder emptying.  11/26- still intermittently needing cathed.  11/27- voiding better 9. Constipation, likely due to Neurogenic bowel. Laxative assistance  11/24- will see how does, but likely will need bowel program.   11/26- so far, is going- not sure if emptying  11/28- did suppository last night- was helpful- might need nightly, but will monitor 10. Hypokalemia  11/24- will replete since K+ is 3.3- replete with 40 mEq x2 today and recheck later this week.  11/26- recheck in AM and then order weekly labs.  1127- K+ 4.2   LOS: 5 days A FACE TO FACE EVALUATION WAS PERFORMED  Lashondra Vaquerano 09/18/2019, 12:55 PM

## 2019-09-18 NOTE — Progress Notes (Addendum)
Physical Therapy Session Note  Patient Details  Name: Sandra Parrish MRN: [id number] Date of Birth: 10-10-1946  Today's Date: 09/18/2019 PT Individual Time: 0800-0900 PT Individual Time Calculation (min): 60 min   Short Term Goals: Week 1:  PT Short Term Goal 1 (Week 1): Pt will initiate w/c mobility PT Short Term Goal 2 (Week 1): Pt will perform least restrictive transfer with assist x 1 PT Short Term Goal 3 (Week 1): Pt will perform bed mobility with mod A  Skilled Therapeutic Interventions/Progress Updates:  Pt sitting in wc, brushing her teeth.  She denied pain.   PT donned shoes.  PT added back cushion to w/c for decreasing posterior pelvic tilt.  W/c propulsion using bil UEs x 30' with min assist.  PT applied Theaband to bil rims to improve grip. Also added apple wood insert to seat cushion for pelvic support and postioning.   Use of Stedy +2 w/c> mat table, to sit on wedge to decrease posterior pelvic.  Therapeutic activity in sitting with feet supported, reaching out of BOS forward with R hand with min assist to manipulate clothes pins onto bar on table in front of her.  Use of L hand for this activity with superviison.   strengthening : 5 x 1 L/R latera leans; min assist needed for control when leaning L due to R trunk weakness. PT instructed pt in diaprhagmatic breathing with fair carrry- over. bil glut sets in sitting x 5; PT recommended pt do this when sitting in the w/c q 30 min for pressure relief.  Sit> stand in [place]; in standing with trunk away from bar, wt shifting L><R x 5 x 2 with mod assist.  Seated rest between bouts of 5. Pt fearful of R knee buckling but understood that it cannot happen with pad of Stedy there.  Transfer into wc with Stedy, +2.  At end of session, pt seated in wc with needs at hand. Pt reported that wooden insert in cushion was much more comfortable.      Therapy Documentation Precautions:  Precautions Precautions: Fall, Cervical Precaution  Comments: reviewed cervical precautions Cervical Brace: Soft collar Restrictions Weight Bearing Restrictions: No      Therapy/Group: Individual Therapy  Witt Plitt 09/18/2019, 10:14 AM

## 2019-09-18 NOTE — Progress Notes (Signed)
Occupational Therapy Session Note  Patient Details  Name: Sandra Parrish MRN: [id number] Date of Birth: [birth year]  Today's Date: 09/18/2019 OT Individual Time: 4332-9518 OT Individual Time Calculation (min): 58 min    Short Term Goals: Week 1:  OT Short Term Goal 1 (Week 1): Pt will perform toilet transfer with assistance of 1 person. OT Short Term Goal 2 (Week 1): Pt will perform UB self care while seated unsupported with min guard. OT Short Term Goal 3 (Week 1): Pt will perform LB dressing with max A overall.  Skilled Therapeutic Interventions/Progress Updates:    1;1. Pt received in bed agreeable to OT. Pt completes supine>sitting EOB with MAX A and VC for sequencing pushing up through arms. Pt completes stedy trasnfer with MIN A sit to stand in stedy. Pt given elevating leg rests as pt reporting after sitting in w/c getting slightly light headed. Pt completes w/c propulsion 1x50 feet with min A for steering to improve BUE strength and endurance. Pt educated on SBT and head hips relationship. Pt completes with MIN-mod A overall with VC for hand placement. Pt stands 2xwith MIN A pushing up on standard arm chair with R knee block by OT and 3x with RW with mod fading to min A. Pt requires MIN A and knee block for weight shifting and alternating step in place in prep for stand step transfers with RW. Exited session with pt setaed in bed, exit alarm on and call light in reach  Therapy Documentation Precautions:  Precautions Precautions: Fall, Cervical Precaution Comments: reviewed cervical precautions Cervical Brace: Soft collar Restrictions Weight Bearing Restrictions: No General:   Vital Signs:   Pain:   ADL:   Vision   Perception    Praxis   Exercises:   Other Treatments:     Therapy/Group: Individual Therapy  Tonny Branch 09/18/2019, 12:06 PM

## 2019-09-18 NOTE — Plan of Care (Signed)
  Problem: Consults Goal: RH SPINAL CORD INJURY PATIENT EDUCATION Description:  See Patient Education module for education specifics.  Outcome: Progressing   Problem: SCI BOWEL ELIMINATION Goal: RH STG MANAGE BOWEL WITH ASSISTANCE Description: STG Manage Bowel with min Assistance. Outcome: Progressing Goal: RH STG SCI MANAGE BOWEL WITH MEDICATION WITH ASSISTANCE Description: STG SCI Manage bowel with medication with min assistance. Outcome: Progressing Goal: RH STG SCI MANAGE BOWEL PROGRAM W/ASSIST OR AS APPROPRIATE Description: STG SCI Manage bowel program with min assist or as appropriate. Outcome: Progressing   Problem: SCI BLADDER ELIMINATION Goal: RH OTHER STG BLADDER ELIMINATION GOALS W/ASSIST Description: Other STG Bladder Elimination Goals With min Assistance Outcome: Progressing   Problem: RH SAFETY Goal: RH STG ADHERE TO SAFETY PRECAUTIONS W/ASSISTANCE/DEVICE Description: STG Adhere to Safety Precautions With cues and reminders Outcome: Progressing   Problem: RH PAIN MANAGEMENT Goal: RH STG PAIN MANAGED AT OR BELOW PT'S PAIN GOAL Description: Pain level less than 4 on scale of 0-10 Outcome: Progressing   Problem: RH KNOWLEDGE DEFICIT SCI Goal: RH STG INCREASE KNOWLEDGE OF SELF CARE AFTER SCI Description: Pt will be able to demonstrate understanding of fall prevention and proper steps to call for help in case of emergency independently upon discharge. Pt will be able to identify and eliminate environmental hazards that can lead to pt's falling independently.  Outcome: Progressing

## 2019-09-19 ENCOUNTER — Inpatient Hospital Stay (HOSPITAL_COMMUNITY): Payer: Self-pay | Admitting: Occupational Therapy

## 2019-09-19 ENCOUNTER — Inpatient Hospital Stay (HOSPITAL_COMMUNITY): Payer: Self-pay | Admitting: Physical Therapy

## 2019-09-19 NOTE — Progress Notes (Signed)
PHYSICAL MEDICINE & REHABILITATION PROGRESS NOTE   Subjective/Complaints:   Pt reports tearfully that had some numbness/tingling on ulnar side of R hand when woke up ~ 3am this AM- still has it, but not as noticeable.  Is voiding and having BMs regularly- hasn't been cathed since yesterday afternoon.  Very tearful about the fact she's having N/T in R hand/4th/5th digit and ulnar side of hand.  Explained she compressed with ulnar nerve and has caused some compression, but needs to stop leaning on W/C arm rest- will help Sx's.   ROS- denies CP, SOB, N/V/D  Objective:   No results found. No results for input(s): WBC, HGB, HCT, PLT in the last 72 hours. Recent Labs    09/17/19 0528  NA 140  K 4.2  CL 101  CO2 27  GLUCOSE 114*  BUN 13  CREATININE 0.64  CALCIUM 8.9    Intake/Output Summary (Last 24 hours) at 09/19/2019 1136 Last data filed at 09/19/2019 0720 Gross per 24 hour  Intake 580 ml  Output 459 ml  Net 121 ml     Physical Exam: Vital Signs Blood pressure 112/72, pulse 77, temperature 97.8 F (36.6 C), resp. rate 19, height 5\' 6"  (1.676 m), weight 95.4 kg, SpO2 94 %.  Physical Exam Vitals  reviewed General: No acute distress; tearful; sitting on manual w/c in room; getting dressed with OT; crying about ulnar neuropathy- thought was from neck/pralysis getting worse; NAD Mood and affect tearful- calmed down when explained ulnar neuropathy Heart: Regular rate and rhythm Lungs: Clear to auscultation B/L Abdomen: (+)BS, soft, NT, ND Extremities: No clubbing, cyanosis, or edema Skin:, honeycomb dressing over the right anterior lower cervical incision. Neurologic: Decreased sensation to light touch on lateral aspect of R 4th digit and 5th digit c/w R ulnar neuropathy- not pinched nerve in neck  Alert and oriented x3, motor strength is 3 - in the right deltoid 3 - in the biceps triceps finger flexors and extensors Left upper extremity is 4 - at the  deltoid bicep tricep finger flexors and extensors 4/5 in the hip flexors knee extensors ankle dorsiflexors plantar flexors bilaterally  lower extremities Fine motor is reduced bilateral hands with finger to thumb opposition. Musculoskeletal: good ROM Neck range of motion not tested specifically, but has decreased cervical flexion/rotation/side bending seen on exam    Assessment/Plan: 1. Functional deficits secondary to Cervical myelopathy s/p C5/6 discectomy, C6-7 decompression and plates and screws arthrodesis  to  which require 3+ hours per day of interdisciplinary therapy in a comprehensive inpatient rehab setting.  Physiatrist is providing close team supervision and 24 hour management of active medical problems listed below.  Physiatrist and rehab team continue to assess barriers to discharge/monitor patient progress toward functional and medical goals  Care Tool:  Bathing    Body parts bathed by patient: Face   Body parts bathed by helper: Right arm, Left arm, Chest, Abdomen, Front perineal area, Buttocks, Right upper leg, Right lower leg, Left upper leg, Left lower leg     Bathing assist Assist Level: Maximal Assistance - Patient 24 - 49%     Upper Body Dressing/Undressing Upper body dressing   What is the patient wearing?: Pull over shirt    Upper body assist Assist Level: Set up assist    Lower Body Dressing/Undressing Lower body dressing      What is the patient wearing?: Incontinence brief, Pants     Lower body assist Assist for lower body dressing: Total Assistance -  Patient < 25%(using Stedy sit<stand)     Editor, commissioning assist Assist for toileting: 2 Helpers     Transfers Chair/bed transfer  Transfers assist     Chair/bed transfer assist level: 2 Helpers     Locomotion Ambulation   Ambulation assist   Ambulation activity did not occur: Safety/medical concerns          Walk 10 feet activity   Assist  Walk 10  feet activity did not occur: Safety/medical concerns        Walk 50 feet activity   Assist Walk 50 feet with 2 turns activity did not occur: Safety/medical concerns         Walk 150 feet activity   Assist Walk 150 feet activity did not occur: Safety/medical concerns         Walk 10 feet on uneven surface  activity   Assist Walk 10 feet on uneven surfaces activity did not occur: Safety/medical concerns         Wheelchair     Assist Will patient use wheelchair at discharge?: Yes Type of Wheelchair: Manual Wheelchair activity did not occur: Safety/medical concerns  Wheelchair assist level: Minimal Assistance - Patient > 75% Max wheelchair distance: 30    Wheelchair 50 feet with 2 turns activity    Assist    Wheelchair 50 feet with 2 turns activity did not occur: Safety/medical concerns       Wheelchair 150 feet activity     Assist  Wheelchair 150 feet activity did not occur: Safety/medical concerns       Blood pressure 112/72, pulse 77, temperature 97.8 F (36.6 C), resp. rate 19, height 5\' 6"  (1.676 m), weight 95.4 kg, SpO2 94 %.    Medical Problem List and Plan: 1.Right upper and lower extremity weaknesssecondary to cervical myelopathy.S/Pdiscectomy C5-6, 6-7 decompression placement of anterior instrumentation interbody plate and screws arthrodesis 09/09/2019. Cervical collar when out of bed -patient may shower -ELOS/Goals: 10-14d, supervision with mobility, min assist showering and lower extremity dressing mod I/supervision upper limb ADL 2. Antithrombotics: -DVT/anticoagulation:SCDs. Check vascular study 11/24- Dopplers (-) however has been since 11/19 since surgery- will see if can get Lovenox started  11/25- started lovenox- OK'd by [facility].  -antiplatelet therapy: N/A 3. Pain Management:Oxycodone and Robaxin as needed  11/24- will start Gabapentin 300 mg BID for nerve pain and titrate up  as required  11/26- helping nerve pain 4. Mood:Provide emotional support -antipsychotic agents: N/A 5. Neuropsych: This patientiscapable of making decisions on herown behalf. 6. Skin/Wound Care:Routine skin checks 7. Fluids/Electrolytes/Nutrition:Routine in and outs with follow-up chemistries 8. 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 Flomax 0.4 mg daily. Check PVR  11/24- is requiring in/out caths- will cont' flomax and see if gets return  11/25- increase Flomax to 0.8 mg nightly for bladder emptying.  11/26- still intermittently needing cathed.  11/27- voiding better 9.  Neurogenic bowel.   11/24- will see how does, but likely will need bowel program.   11/26- so far, is going- not sure if emptying  11/28- did suppository last night- was helpful- might need nightly, but will monitor  11/29- having BMs on toilet, it sounds like, but not documented if on toilet- required some dig stim per nursing note and I/o cath last night- so will order bowel program for now.  10. Hypokalemia  11/24- will replete since K+ is 3.3- replete with 40 mEq x2 today and recheck later this week.  11/26- recheck in AM and then  order weekly labs.  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    LOS: 6 days A FACE TO FACE EVALUATION WAS PERFORMED  Mckynleigh Mussell 09/19/2019, 11:36 AM

## 2019-09-19 NOTE — Progress Notes (Signed)
Physical Therapy Session Note  Patient Details  Name: Sandra Parrish MRN: [id number] Date of Birth: June 26, 1946  Today's Date: 09/19/2019 PT Individual Time: 1100-1155 PT Individual Time Calculation (min): 55 min   Short Term Goals: Week 1:  PT Short Term Goal 1 (Week 1): Pt will initiate w/c mobility PT Short Term Goal 2 (Week 1): Pt will perform least restrictive transfer with assist x 1 PT Short Term Goal 3 (Week 1): Pt will perform bed mobility with mod A  Skilled Therapeutic Interventions/Progress Updates:   Pt in w/c and agreeable to therapy, no c/o pain. Mod assist to scoot back in w/c. Pt self-propelled w/c 50' x3 w/ BUEs and light min assist to work on UE strengthening and global endurance. Total assist remainder of way to day room. Worked on BLE strengthening on kinetron @ level 40 cm/sec. 1 min x5 reps. Verbal and tactile cues for neutral BLE alignment. Worked on sit<>stands to standard walker. Verbal, tactile, and verbal cues for technique to sit<>stands, transitioning to BUEs on walker, and for static standing posture. Mod physical assist to boost, 2nd helper stand-by for safety and stabilizing walker. Sit<>stands x4 reps, maintained static stance in 5-15 sec bouts w/ mod-max assist. Pt w/ excessive anterior weight shifting at hips and only intermittently able to achieve full knee extension bilaterally. Mirror for visual feedback of hip and knee positioning, minor improvements but ultimately needed max assist to prevent falling forward. Max assist also needed to control descent back to chair.Returned to room total assist via w/c, ended session in w/c and all needs in reach. Quick release belt donned on chair, but not plugged in, only in place to prevent pt from sliding forward as she is unable to boost back in chair w/o assist.   Therapy Documentation Precautions:  Precautions Precautions: Fall, Cervical Precaution Comments: reviewed cervical precautions Cervical Brace: Soft  collar Restrictions Weight Bearing Restrictions: No Pain:    Therapy/Group: Individual Therapy  Quindarius Cabello Clent Demark 09/19/2019, 12:17 PM

## 2019-09-19 NOTE — Plan of Care (Signed)
  Problem: Consults Goal: RH SPINAL CORD INJURY PATIENT EDUCATION Description:  See Patient Education module for education specifics.  Outcome: Progressing   Problem: SCI BOWEL ELIMINATION Goal: RH STG MANAGE BOWEL WITH ASSISTANCE Description: STG Manage Bowel with min Assistance. Outcome: Progressing Goal: RH STG SCI MANAGE BOWEL WITH MEDICATION WITH ASSISTANCE Description: STG SCI Manage bowel with medication with min assistance. Outcome: Progressing Goal: RH STG SCI MANAGE BOWEL PROGRAM W/ASSIST OR AS APPROPRIATE Description: STG SCI Manage bowel program with min assist or as appropriate. Outcome: Progressing   Problem: SCI BLADDER ELIMINATION Goal: RH OTHER STG BLADDER ELIMINATION GOALS W/ASSIST Description: Other STG Bladder Elimination Goals With min Assistance Outcome: Progressing   Problem: RH SAFETY Goal: RH STG ADHERE TO SAFETY PRECAUTIONS W/ASSISTANCE/DEVICE Description: STG Adhere to Safety Precautions With cues and reminders Outcome: Progressing   Problem: RH PAIN MANAGEMENT Goal: RH STG PAIN MANAGED AT OR BELOW PT'S PAIN GOAL Description: Pain level less than 4 on scale of 0-10 Outcome: Progressing   Problem: RH KNOWLEDGE DEFICIT SCI Goal: RH STG INCREASE KNOWLEDGE OF SELF CARE AFTER SCI Description: Pt will be able to demonstrate understanding of fall prevention and proper steps to call for help in case of emergency independently upon discharge. Pt will be able to identify and eliminate environmental hazards that can lead to pt's falling independently.  Outcome: Progressing   

## 2019-09-19 NOTE — Progress Notes (Signed)
Occupational Therapy Session Note  Patient Details  Name: Sandra Parrish MRN: [id number] Date of Birth: [birth year]  Today's Date: 09/19/2019 OT Individual Time: 0940-7680 and 8811-0315 OT Individual Time Calculation (min): 59 min and 57 min   Short Term Goals: Week 1:  OT Short Term Goal 1 (Week 1): Pt will perform toilet transfer with assistance of 1 person. OT Short Term Goal 2 (Week 1): Pt will perform UB self care while seated unsupported with min guard. OT Short Term Goal 3 (Week 1): Pt will perform LB dressing with max A overall.  Skilled Therapeutic Interventions/Progress Updates:    Pt greeted in w/c with no c/o pain. Declining shower but requesting to brush her teeth and get dressed. Started with oral care and grooming tasks w/c level at the sink. Focused was placed on Rt NMR with pt reaching for faucet levers with Rt and using Rt during bilateral tasks. Also worked on trunk control when leaning forward towards sink. Afterwards she completed dressing at sit<stand level using Stedy. Max A for sit<stands with vcs for forward scooting before power up. While standing pt was able to fully lower her brief and pajama pants using both hands, hooking Rt thumb into LB garments to meet task demands on Rt side. Max A for threading LEs into pants, however pt able to elevate pants once they were positioned at mid-calf. Total A for Teds and sneakers. Supervision for doffing/donning shirts. Had pt reach for each article of clothing using Rt during dressing tasks as well. At end of session pt remained in the w/c with all needs within reach.      2nd Session 1:1 tx (57 min) Pt greeted in w/c and denying pain. ADL needs met and agreeable to go off of unit for tx. Pt was escorted to the atrium via w/c. While in this community setting worked on [employer] and w/c proficiency by self propelling around natural barriers. Vcs for navigating turns and Min A for making tight turns towards Lt side. W/c  push up isometrics completed while sitting in front of Christmas tree for UB (particularly core) strengthening. Pt states she isn't aware if she is activating core due to sensation deficits in abdominal region. She was very fatigued and needed prolonged rest breaks in between reps. Pt was then escorted back to the room and transferred back to bed via Stedy. Max A for sit<stands x3 while transferring, doffing clothing, and donning pajamas. She doffed/donned shirts with supervision for sitting balance EOB. Mod A for transition to supine. Pt boosted herself up in bed once LEs were stabilized. She remained in bed with all needs within reach and bed alarm set.   Therapy Documentation Precautions:  Precautions Precautions: Fall, Cervical Precaution Comments: reviewed cervical precautions Cervical Brace: Soft collar Restrictions Weight Bearing Restrictions: No Vital Signs: Therapy Vitals Temp: 97.7 F (36.5 C) Pulse Rate: 82 Resp: 14 BP: (!) 104/56 Patient Position (if appropriate): Lying Oxygen Therapy SpO2: 95 % O2 Device: Room Air ADL:       Therapy/Group: Individual Therapy  Aariya Ferrick A Bronda Alfred 09/19/2019, 4:01 PM

## 2019-09-20 ENCOUNTER — Inpatient Hospital Stay (HOSPITAL_COMMUNITY): Payer: Self-pay | Admitting: Physical Therapy

## 2019-09-20 ENCOUNTER — Inpatient Hospital Stay (HOSPITAL_COMMUNITY): Payer: Self-pay

## 2019-09-20 ENCOUNTER — Inpatient Hospital Stay (HOSPITAL_COMMUNITY): Payer: Self-pay | Admitting: Occupational Therapy

## 2019-09-20 LAB — BASIC METABOLIC PANEL
Anion gap: 8 (ref 5–15)
BUN: 14 mg/dL (ref 8–23)
CO2: 27 mmol/L (ref 22–32)
Calcium: 8.7 mg/dL — ABNORMAL LOW (ref 8.9–10.3)
Chloride: 103 mmol/L (ref 98–111)
Creatinine, Ser: 0.69 mg/dL (ref 0.44–1.00)
GFR calc Af Amer: >60 mL/min (ref >60)
GFR calc non Af Amer: >60 mL/min (ref >60)
Glucose, Bld: 105 mg/dL — ABNORMAL HIGH (ref 70–99)
Potassium: 4 mmol/L (ref 3.5–5.1)
Sodium: 138 mmol/L (ref 135–145)

## 2019-09-20 LAB — CBC WITH DIFFERENTIAL/PLATELET
Abs Immature Granulocytes: 0.03 10*3/uL (ref 0.00–0.07)
Basophils Absolute: 0.1 10*3/uL (ref 0.0–0.1)
Basophils Relative: 1 %
Eosinophils Absolute: 0.3 10*3/uL (ref 0.0–0.5)
Eosinophils Relative: 4 %
HCT: 36.6 % (ref 36.0–46.0)
Hemoglobin: 11.7 g/dL — ABNORMAL LOW (ref 12.0–15.0)
Immature Granulocytes: 0 %
Lymphocytes Relative: 22 %
Lymphs Abs: 1.8 10*3/uL (ref 0.7–4.0)
MCH: 30.1 pg (ref 26.0–34.0)
MCHC: 32 g/dL (ref 30.0–36.0)
MCV: 94.1 fL (ref 80.0–100.0)
Monocytes Absolute: 0.5 10*3/uL (ref 0.1–1.0)
Monocytes Relative: 6 %
Neutro Abs: 5.5 10*3/uL (ref 1.7–7.7)
Neutrophils Relative %: 67 %
Platelets: 436 10*3/uL — ABNORMAL HIGH (ref 150–400)
RBC: 3.89 MIL/uL (ref 3.87–5.11)
RDW: 12.2 % (ref 11.5–15.5)
WBC: 8.2 10*3/uL (ref 4.0–10.5)
nRBC: 0 % (ref 0.0–0.2)

## 2019-09-20 NOTE — Progress Notes (Signed)
Physical Therapy Session Note  Patient Details  Name: Sandra Parrish MRN: [id number] Date of Birth: Feb 19, 1946  Today's Date: 09/20/2019 PT Individual Time: 1345-1505 PT Individual Time Calculation (min): 80 min   Short Term Goals: Week 1:  PT Short Term Goal 1 (Week 1): Pt will initiate w/c mobility PT Short Term Goal 2 (Week 1): Pt will perform least restrictive transfer with assist x 1 PT Short Term Goal 3 (Week 1): Pt will perform bed mobility with mod A  Skilled Therapeutic Interventions/Progress Updates:  Pt resting in bed.  She rated pain L upper trap area 4/10.  She declined meds.  In flat bed, no rails, rolling L with mod assist.  Rolling training from partial L side lying>< L side lying with mod assist, max cues.  To sit, mod/max assist for elevation of trunk and LE movement off of bed.  In sitting, L/R lateral leans x 5 each.  Pt has more difficulty with L lateral leans.   Slide board transfer to R, bed> w/c.  Pt able to self direct with min /mod cues; min assist physically.  Transfer training in w/c with trunk flexion/extension while pushing/pulling with bil hands on PT, sitting in front of her.  Seated activity volleying beach ball with 1# weighted bar, with back support> no back support.  Reviewed sequence for sit> stand; min cues.  In parallel bars, pt pulled up to stand with min assist.  In standing, using mirror for visual feedback, R knee flex/extension x 10. Sit> stand 2nd bout with mod assist; gait with shoe cover on R shoe, x 3' forward/backward with max assist for RLE stance stability and advancement of RLE.  Pt tearful and overwhelmed that she has started walking; pt offered emotional support.  PT showed pt an AFO and discussed need for shoes with greater depth, and tread.  Pt voiced understanding. Pt called her friend Amy at the end of session; she will bring in a pair of shoes that may work.  Slide board transfer to return to bed; mod asisst due to fatigue.  Mod  assist sit< supine.  At end of session, pt in bed with alarm set and needs at hand.     Therapy Documentation Precautions:  Precautions Precautions: Fall, Cervical Precaution Comments: reviewed cervical precautions Cervical Brace: Soft collar Restrictions Weight Bearing Restrictions: No       Therapy/Group: Individual Therapy  Denean Pavon 09/20/2019, 3:13 PM

## 2019-09-20 NOTE — Progress Notes (Signed)
[facility] PHYSICAL MEDICINE & REHABILITATION PROGRESS NOTE   Subjective/Complaints:  Decreased numbness and tingling in the ulnar distribution of her left hand.   Denies pain, constipation, urinary difficulties, sleeping "as well as she can in the hospital" Has had some moments of low mood but is so appreciative of the nursing and therapy staff for their understanding and care.  Labs stable today.    ROS- denies CP, SOB, N/V/D  Objective:   No results found. Recent Labs    09/20/19 0613  WBC 8.2  HGB 11.7*  HCT 36.6  PLT 436*   Recent Labs    09/20/19 0613  NA 138  K 4.0  CL 103  CO2 27  GLUCOSE 105*  BUN 14  CREATININE 0.69  CALCIUM 8.7*    Intake/Output Summary (Last 24 hours) at 09/20/2019 0930 Last data filed at 09/20/2019 0730 Gross per 24 hour  Intake 600 ml  Output 538 ml  Net 62 ml     Physical Exam: Vital Signs Blood pressure 131/61, pulse 78, temperature 97.9 F (36.6 C), resp. rate 17, height 5\' 6"  (1.676 m), weight 95.4 kg, SpO2 91 %.  Physical Exam Vitals  reviewed General: No acute distress; pleasant, sitting up in WC, comfortable. Heart: Regular rate and rhythm Lungs: Clear to auscultation B/L Abdomen: (+)BS, soft, NT, ND Extremities: No clubbing, cyanosis, or edema Skin:, honeycomb dressing over the right anterior lower cervical incision. Neurologic: Decreased sensation to light touch on lateral aspect of R 4th digit and 5th digit c/w R ulnar neuropathy- not pinched nerve in neck  Alert and oriented x3, motor strength is 3 - in the right deltoid 3 - in the biceps triceps finger flexors and extensors Left upper extremity is 4 - at the deltoid bicep tricep finger flexors and extensors 4/5 in the hip flexors knee extensors ankle dorsiflexors plantar flexors bilaterally  lower extremities Fine motor is reduced bilateral hands with finger to thumb opposition. Musculoskeletal: good ROM Neck range of motion not tested specifically, but has  decreased cervical flexion/rotation/side bending seen on exam    Assessment/Plan: 1. Functional deficits secondary to Cervical myelopathy s/p C5/6 discectomy, C6-7 decompression and plates and screws arthrodesis  to  which require 3+ hours per day of interdisciplinary therapy in a comprehensive inpatient rehab setting.  Physiatrist is providing close team supervision and 24 hour management of active medical problems listed below.  Physiatrist and rehab team continue to assess barriers to discharge/monitor patient progress toward functional and medical goals  Care Tool:  Bathing    Body parts bathed by patient: Face   Body parts bathed by helper: Right arm, Left arm, Chest, Abdomen, Front perineal area, Buttocks, Right upper leg, Right lower leg, Left upper leg, Left lower leg     Bathing assist Assist Level: Maximal Assistance - Patient 24 - 49%     Upper Body Dressing/Undressing Upper body dressing   What is the patient wearing?: Pull over shirt    Upper body assist Assist Level: Supervision/Verbal cueing    Lower Body Dressing/Undressing Lower body dressing      What is the patient wearing?: Pants     Lower body assist Assist for lower body dressing: Minimal Assistance - Patient > 75%(Using STEDY)     Toileting Toileting    Toileting assist Assist for toileting: 2 Helpers     Transfers Chair/bed transfer  Transfers assist     Chair/bed transfer assist level: Moderate Assistance - Patient 50 - 74%(Sliding board)  Locomotion Ambulation   Ambulation assist   Ambulation activity did not occur: Safety/medical concerns          Walk 10 feet activity   Assist  Walk 10 feet activity did not occur: Safety/medical concerns        Walk 50 feet activity   Assist Walk 50 feet with 2 turns activity did not occur: Safety/medical concerns         Walk 150 feet activity   Assist Walk 150 feet activity did not occur: Safety/medical  concerns         Walk 10 feet on uneven surface  activity   Assist Walk 10 feet on uneven surfaces activity did not occur: Safety/medical concerns         Wheelchair     Assist Will patient use wheelchair at discharge?: Yes Type of Wheelchair: Manual Wheelchair activity did not occur: Safety/medical concerns  Wheelchair assist level: Minimal Assistance - Patient > 75% Max wheelchair distance: 71'    Wheelchair 50 feet with 2 turns activity    Assist    Wheelchair 50 feet with 2 turns activity did not occur: Safety/medical concerns   Assist Level: Minimal Assistance - Patient > 75%   Wheelchair 150 feet activity     Assist  Wheelchair 150 feet activity did not occur: Safety/medical concerns       Blood pressure 131/61, pulse 78, temperature 97.9 F (36.6 C), resp. rate 17, height 5\' 6"  (1.676 m), weight 95.4 kg, SpO2 91 %.    Medical Problem List and Plan: 1.Right upper and lower extremity weaknesssecondary to cervical myelopathy.S/Pdiscectomy C5-6, 6-7 decompression placement of anterior instrumentation interbody plate and screws arthrodesis 09/09/2019. Cervical collar when out of bed -patient may shower -ELOS/Goals: 10-14d, supervision with mobility, min assist showering and lower extremity dressing mod I/supervision upper limb ADL 2. Antithrombotics: -DVT/anticoagulation:SCDs. Check vascular study 11/24- Dopplers (-) however has been since 11/19 since surgery- will see if can get Lovenox started  11/25- started lovenox- OK'd by [facility].  -antiplatelet therapy: N/A 3. Pain Management:Oxycodone and Robaxin as needed  11/24- will start Gabapentin 300 mg BID for nerve pain and titrate up as required  11/26- helping nerve pain 4. Mood:Provide emotional support -antipsychotic agents: N/A 5. Neuropsych: This patientiscapable of making decisions on herown behalf. 6. Skin/Wound Care:Routine skin  checks 7. Fluids/Electrolytes/Nutrition:Routine in and outs with follow-up chemistries 8. 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 Flomax 0.4 mg daily. Check PVR  11/24- is requiring in/out caths- will cont' flomax and see if gets return  11/25- increase Flomax to 0.8 mg nightly for bladder emptying.  11/26- still intermittently needing cathed.  11/27- voiding better 9.  Neurogenic bowel.   11/24- will see how does, but likely will need bowel program.   11/26- so far, is going- not sure if emptying  11/28- did suppository last night- was helpful- might need nightly, but will monitor  11/29- having BMs on toilet, it sounds like, but not documented if on toilet- required some dig stim per nursing note and I/o cath last night- so will order bowel program for now.  10. Hypokalemia  11/24- will replete since K+ is 3.3- replete with 40 mEq x2 today and recheck later this week.  11/26- recheck in AM and then order weekly labs.  1127- K+ 4.2 11. R ulnar neuropathy  11/29- explained what it was- why she likely had it- to avoid compressing nerve by resting arm on W/C arm rest- will likely get better if doesn't  compress nerve.   11/30: improving, patient very happy about this   LOS: 7 days A FACE TO FACE EVALUATION WAS PERFORMED  Rilya Longo P Kynnedy Carreno 09/20/2019, 9:30 AM

## 2019-09-20 NOTE — Progress Notes (Signed)
Physical Therapy Session Note  Patient Details  Name: Sandra Parrish MRN: [id number] Date of Birth: 1946-01-01  Today's Date: 09/20/2019 PT Individual Time: 1025-8527 PT Individual Time Calculation (min): 113 min   Short Term Goals: Week 1:  PT Short Term Goal 1 (Week 1): Pt will initiate w/c mobility PT Short Term Goal 2 (Week 1): Pt will perform least restrictive transfer with assist x 1 PT Short Term Goal 3 (Week 1): Pt will perform bed mobility with mod A  Skilled Therapeutic Interventions/Progress Updates:    Pt received seated in w/c in room, agreeable to PT session. No complaints of pain, does complain of "band" sensation around mid-thoracic region. Manual w/c propulsion 2 x 25 ft with use of BUE with occasional CGA to min A needed for steering, Supervision for propelling in a straight line. Pt fatigues quickly with w/c propulsion. Slide board transfer w/c to mat table with min A to the R. Pt is close SBA for sitting balance EOM. Sit to stand x 3 reps to RW with mod A +2 progressing to mod A +1 with focus on UE placement and anterior weight shift during transfer. Pt initially able to stand upright with min cueing but with onset of fatigue exhibits increase in hip flexion and R knee flexion, multimodal cueing for muscle activation of hip and knee extensors. Standing forward/backward steps with RW and assist x 2 for balance and RW management. Pt exhibits fair control of BLE when taking steps with narrow BOS. Pt also exhibits poor ability to maintain standing balance with posterior leaning in standing. Slide board transfer back to w/c with min A then back to bed with mod A due to onset of fatigue. Sit to supine mod A for BLE management. Once in supine pt reports urge to have a BM. Supine to sit with mod A. Pt is min A to stand to stedy. Stedy transfer to toilet. Pt is max A for clothing management and dependent for pericare. Stedy transfer back to bed. Sit to supine mod A for BLE management.  Supine BLE strengthening therex: heel slides, hip abd, ankle pumps, quad sets x 10 reps each with AAROM for RLE. Pt left semi-reclined in bed with needs in reach, bed alarm in place at end of session.  Therapy Documentation Precautions:  Precautions Precautions: Fall, Cervical Precaution Comments: reviewed cervical precautions Cervical Brace: Soft collar Restrictions Weight Bearing Restrictions: No   Therapy/Group: Individual Therapy   Excell Seltzer, PT, DPT  09/20/2019, 11:55 AM

## 2019-09-20 NOTE — Plan of Care (Signed)
  Problem: Consults Goal: RH SPINAL CORD INJURY PATIENT EDUCATION Description:  See Patient Education module for education specifics.  Outcome: Progressing   Problem: SCI BOWEL ELIMINATION Goal: RH STG MANAGE BOWEL WITH ASSISTANCE Description: STG Manage Bowel with min Assistance. Outcome: Progressing Goal: RH STG SCI MANAGE BOWEL WITH MEDICATION WITH ASSISTANCE Description: STG SCI Manage bowel with medication with min assistance. Outcome: Progressing Goal: RH STG SCI MANAGE BOWEL PROGRAM W/ASSIST OR AS APPROPRIATE Description: STG SCI Manage bowel program with min assist or as appropriate. Outcome: Progressing   Problem: SCI BLADDER ELIMINATION Goal: RH OTHER STG BLADDER ELIMINATION GOALS W/ASSIST Description: Other STG Bladder Elimination Goals With min Assistance Outcome: Progressing   Problem: RH SAFETY Goal: RH STG ADHERE TO SAFETY PRECAUTIONS W/ASSISTANCE/DEVICE Description: STG Adhere to Safety Precautions With cues and reminders Outcome: Progressing   Problem: RH PAIN MANAGEMENT Goal: RH STG PAIN MANAGED AT OR BELOW PT'S PAIN GOAL Description: Pain level less than 4 on scale of 0-10 Outcome: Progressing   Problem: RH KNOWLEDGE DEFICIT SCI Goal: RH STG INCREASE KNOWLEDGE OF SELF CARE AFTER SCI Description: Pt will be able to demonstrate understanding of fall prevention and proper steps to call for help in case of emergency independently upon discharge. Pt will be able to identify and eliminate environmental hazards that can lead to pt's falling independently.  Outcome: Progressing   

## 2019-09-20 NOTE — Progress Notes (Signed)
Occupational Therapy Session Note  Patient Details  Name: Sandra Parrish MRN: [id number] Date of Birth: 1945/11/16  Today's Date: 09/20/2019 OT Individual Time: 4496-7591 OT Individual Time Calculation (min): 70 min    Short Term Goals: Week 1:  OT Short Term Goal 1 (Week 1): Pt will perform toilet transfer with assistance of 1 person. OT Short Term Goal 2 (Week 1): Pt will perform UB self care while seated unsupported with min guard. OT Short Term Goal 3 (Week 1): Pt will perform LB dressing with max A overall.  Skilled Therapeutic Interventions/Progress Updates:    Pt seen for OT ADL session focusing on ADL re-training and neuro re-ed. Pt awake in supine upon arrival, eager to begin tx session and denying pain.   Transfers: supine>sitting EOB with mod A using hospital bed functions and VCs for technique.  Sliding board transfer EOB>w/c with mod A, VCs for anterior weightshift and hand placement. Sit<>stand in STEDY with min A from slightly elevated EOB. W/C <> EOM in therapy gym via slide board with emphasis on pt directing care with transfer set-up and education on-going.  ADL Re-Training: Dressed seated EOB. Supervision UB dressing with increased time and close guarding assist for sitting balance EOB. Donned pants seated EOB, reaching forward to thread pants with min A for clothing management. Sit>stand in STEDY with CGA. Pt able to complete clothing management standing in STEDY with close supervision.  Grooming tasks completed from w/c level at sink. Able to use R UE at stabilizer level during functional tasks and requiring use of sink ledge to pull self forward towards sink ledge.   Neuro Re-Ed: Sitting balance EOM in therapy gym. Mirror placed for visual feedback of postural control. Close supervision-min A overall for sitting balance with significant posterior pelvic tilt and oblique flexion on R. Tactile and multi-modal cuing for anterior weight shift and midline sitting.  Tolerates ~15 seconds of upright unsupported sitting before requiring reclined rest break.  Pt returned to room at end of session, left seated in w/c with all needs in reach and MD present for AM [name].   Therapy Documentation Precautions:  Precautions Precautions: Fall, Cervical Precaution Comments: reviewed cervical precautions Cervical Brace: Soft collar Restrictions Weight Bearing Restrictions: No Pain: Pain Assessment Pain Scale: 0-10 Pain Score: 0-No pain   Therapy/Group: Individual Therapy  Mariah Gerstenberger L 09/20/2019, 6:57 AM

## 2019-09-21 ENCOUNTER — Inpatient Hospital Stay (HOSPITAL_COMMUNITY): Payer: Self-pay

## 2019-09-21 ENCOUNTER — Inpatient Hospital Stay (HOSPITAL_COMMUNITY): Payer: Self-pay | Admitting: Physical Therapy

## 2019-09-21 NOTE — Patient Care Conference (Signed)
Inpatient RehabilitationTeam Conference and Plan of Care Update Date: 09/21/2019   Time: 11:20 AM   Patient Name: Sandra Parrish      Medical Record Number: [id number]  Date of Birth: Feb 21, 1946 Sex: Female         Room/Bed: 4M12C/4M12C-01 Payor Info: Payor: MEDICARE / Plan: MEDICARE PART A / Product Type: *No Product type* /    Admit Date/Time:  09/13/2019  1:20 PM  Primary Diagnosis:  Cervical myelopathy ([place])  Patient Active Problem List   Diagnosis Date Noted  . Neurogenic bladder 09/14/2019  . Cervical myelopathy ([place]) 09/13/2019  . Cervical spinal stenosis   . Bradycardia   . Acute blood loss anemia   . Urinary retention   . Postoperative pain   . Drug induced constipation   . Stenosis of cervical spine with myelopathy ([place]) 09/07/2019    Expected Discharge Date: Expected Discharge Date: (estimated LOS 3 weeks.)  Team Members Present: Physician leading conference: Dr. Courtney Heys Social Worker Present: Ovidio Kin, LCSW Nurse Present: Suella Grove, RN Case Manager: Karene Fry, RN PT Present: Excell Seltzer, PT OT Present: Amy Rounds, OT SLP Present: Jettie Booze, CF-SLP PPS Coordinator present : Gunnar Fusi, SLP     Current Status/Progress Goal Weekly Team Focus  Bowel/Bladder   improving neurgenic bladder. urinating and BMS independently more frequently, no cath in 24 hours  continue to improve continence, regualr BMs  adress toileting q shift and prn, PVR   Swallow/Nutrition/ Hydration             ADL's   Min-mod A sliding board transfers, total A toileting using STEDY, mod A LB dressing using STEDY, supervision/set-up UB bathing/dressing and grooming tasks.  Min A overall  ADL re-training, functional transfers, neuro re-ed, sitting/standing balance/tolerance   Mobility   mod A bed mobility, mod A to stand to RW/min A to stedy, slide board transfer mod A, w/c mobility min A  min A overall  bed mobility, transfers, standing and pre-gait with RW, w/c  mobility   Communication             Safety/Cognition/ Behavioral Observations            Pain   pain pain and stiffness, prns available  pain less than 5  assess q shift and prn   Skin   surgical wound to ant neck is dry and intact, OTA. bruising continues to improve  maintain skin integrity  q shift and prn assessment    Rehab Goals Patient on target to meet rehab goals: Yes *See Care Plan and progress notes for long and short-term goals.     Barriers to Discharge  Current Status/Progress Possible Resolutions Date Resolved   Nursing                  PT  Inaccessible home environment;Decreased caregiver support;Home environment access/layout;Lack of/limited family support  pt lives alone in 2nd floor apartment; does not have 24/7 assist upon d/c              OT                  SLP                SW                Discharge Planning/Teaching Needs:  Pt has only intermittent assistance available from friends.  May need to consider SNF, however, insurance coverage an issue as well.  TBD  Team Discussion: Numbness R hand, not emptying with BM, needs bowel program, increased flomax, is voiding, started lovenox.  RN - cont bowel, but not emptying, no pain surgical incision okay, using barrier cream.  OT min/mod slide board, S UB B/D at sink, min A goals.  PT mod bed, min/mod slide board, mod stand pivot, amb 5' walker, CGA w/c 25'.  No help at home, 2nd story apartment.   Revisions to Treatment Plan: N/A     Medical Summary Current Status: has been able to void without cathing for 24-48 hrs- bowels- not emptying so bowel program 2-3x/week Weekly Focus/Goal: incision/surgical site healing  Barriers to Discharge: Weight;Behavior;Neurogenic Bowel & Bladder;Medication compliance;Medical stability  Barriers to Discharge Comments: n/a Possible Resolutions to Barriers: don't put arm on W/C arm rest   Continued Need for Acute Rehabilitation Level of Care: The patient requires  daily medical management by a physician with specialized training in physical medicine and rehabilitation for the following reasons: Direction of a multidisciplinary physical rehabilitation program to maximize functional independence : Yes Medical management of patient stability for increased activity during participation in an intensive rehabilitation regime.: Yes Analysis of laboratory values and/or radiology reports with any subsequent need for medication adjustment and/or medical intervention. : Yes   I attest that I was present, lead the team conference, and concur with the assessment and plan of the team.   Trish Mage 09/21/2019, 2:55 PM  Team conference was held via web/ teleconference due to COVID - 19

## 2019-09-21 NOTE — Progress Notes (Signed)
Occupational Therapy Session Note  Patient Details  Name: Sandra Parrish MRN: [id number] Date of Birth: 1946-02-21  Today's Date: 09/21/2019 OT Individual Time: 0730-0830 OT Individual Time Calculation (min): 60 min   Session 2:  OT Individual Time: 1500-1600 OT Individual Time Calculation (min): 60 min    Short Term Goals: Week 1:  OT Short Term Goal 1 (Week 1): Pt will perform toilet transfer with assistance of 1 person. OT Short Term Goal 2 (Week 1): Pt will perform UB self care while seated unsupported with min guard. OT Short Term Goal 3 (Week 1): Pt will perform LB dressing with max A overall.  Skilled Therapeutic Interventions/Progress Updates:    Pt received supine with no c/o pain. Pt requesting to change clothes and save shower for later shower. Pt completed bed mobility to EOB with mod A, requiring mod cueing for log rolling. Pt required CGA for sitting balance. Pt able to change shirt with CGA. Pt completed sit > stand in stedy with min A. With min balance support pt able to pull down pants in standing and then returned to sitting EOB. Pt used tongs to thread pants over BLE, requiring mod A overall to don pants. Pt stood with RW from elevated EOB, with mod A. Cueing for UE placement. Pt completed stand pivot transfer to the w/c with RW with mod A! Pt emotional and excited re progress. Pt seated in w/c, able to complete oral hygiene at the sink with set up assist. Pt required assistance to put hair in ponytail. Pt required assistance to navigate w/c for turn. Pt completed IADL laundry folding task seated in her w/c for BUE coordination. Pt left sitting up in the w/c with all needs met.    Session 2: Pt received in w/c with no c/o pain requesting to use bathroom and shower. Pt completed sit > stand in the STEDY with CGA. Pt was transferred onto [facility] over toilet. Pt able to void urine. Pt doffed all clothing with mod A for doffing LB clothing. Pt transferred onto TTB facing out in walk  in shower. Cervical incision occluded for shower. Pt able to complete all UB bathing with set up assist. Pt stood in shower with +2 assist for safety, mod A overall. Pt required max A to wash peri areas. Pt doffed shirt following shower with set up assist. Pt was returned to w/c with stedy. Pt used tongs and increased time/cueing to thread depends and pants over BLE with no physical assist! Pt able to pull up in standing with mod A, use of RW to stand. Pt completed hair care at sink with set up. Pt was left sitting up in the w/c with all needs met.  Of note- pt very emotional at mention of SNF at d/c. Had lengthy discussion with pt re CLOF and helped her identify multiple, mainly IADL, barriers to returning to living alone. Pt will continue to need heavy education, as she is confident she can return to activities such as carrying trash up/down a flight of stairs... OT does not recommend.   Therapy Documentation Precautions:  Precautions Precautions: Fall, Cervical Precaution Comments: reviewed cervical precautions Cervical Brace: Soft collar Restrictions Weight Bearing Restrictions: No   Therapy/Group: Individual Therapy  [name] 09/21/2019, 6:50 AM

## 2019-09-21 NOTE — Progress Notes (Signed)
Physical Therapy Weekly Progress Note  Patient Details  Name: Sandra Parrish MRN: [id number] Date of Birth: 12/21/1945  Beginning of progress report period: September 14, 2019 End of progress report period: September 21, 2019  Today's Date: 09/21/2019 PT Individual Time: 0932-3557 PT Individual Time Calculation (min): 75 min   Patient has met 3 of 3 short term goals.  Pt has made great progress in therapy over the past week. She is currently at mod A level for bed mobility, is able to stand with min to mod A, is able to perform transfers with min to mod A overall, has initiated w/c mobility which she currently needs CGA with, and is able to ambulate very short distances in the // bars with assist x 2. However, the pt does not have 24/7 caregiver assist upon d/c home and will likely not reach a functional level at which she will be safe to be independent with mobility and transfers.  Patient continues to demonstrate the following deficits muscle weakness, abnormal tone, unbalanced muscle activation and decreased coordination and decreased sitting balance, decreased standing balance, decreased postural control and decreased balance strategies and therefore will continue to benefit from skilled PT intervention to increase functional independence with mobility.  Patient progressing toward long term goals..  Continue plan of care.  PT Short Term Goals Week 1:  PT Short Term Goal 1 (Week 1): Pt will initiate w/c mobility PT Short Term Goal 1 - Progress (Week 1): Met PT Short Term Goal 2 (Week 1): Pt will perform least restrictive transfer with assist x 1 PT Short Term Goal 2 - Progress (Week 1): Met PT Short Term Goal 3 (Week 1): Pt will perform bed mobility with mod A PT Short Term Goal 3 - Progress (Week 1): Met Week 2:  PT Short Term Goal 1 (Week 2): Pt will be at Supervision level for all w/c mobility PT Short Term Goal 2 (Week 2): Pt will perform bed mobility with min A consistently PT Short  Term Goal 3 (Week 2): Pt will ambulate x 15 ft via least restrictive method  Skilled Therapeutic Interventions/Progress Updates:    Pt received seated in w/c in room, agreeable to PT session. No complaints of pain. Manual w/c propulsion 2 x 30 ft with use of BUE, CGA for turning corners and Supervision along a straight hallway. Pt requires mod cueing for technique needed in order to navigate corners. Sit to stand with mod A in // bars. Ambulation 2 x 5' in // bars with max A for balance, min A to advance RLE, R knee blocked in stance, close chair follow for safety. Pt exhibits decreased stance time on RLE during gait and fair ability to control RLE when taking a step. Pt fatigues quickly with gait training. Stand pivot transfer w/c to mat table with RW and mod A with SBA from a 2nd person for safety. Sit to stand x 5 reps to RW with min A fading to mod A with onset of fatigue, focus on anterior weight shift and hand placement during transfer. Slide board transfer back to w/c then back to bed with mod A needed due to fatigue. Pt requires min cueing for safe setup of slide board transfer. Sit to supine mod A for BLE management. Pt left semi-reclined in bed with needs in reach at end of session.  Therapy Documentation Precautions:  Precautions Precautions: Fall, Cervical Precaution Comments: reviewed cervical precautions Cervical Brace: Soft collar Restrictions Weight Bearing Restrictions: No   Therapy/Group: Individual  Therapy   Excell Seltzer, PT, DPT  09/21/2019, 12:44 PM

## 2019-09-21 NOTE — Progress Notes (Signed)
Pt slept well throughout night without issue. Able to urinate on her own without residual overnight. 

## 2019-09-21 NOTE — Progress Notes (Signed)
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  Getting shower today.  Did a stand pivot transfer mod assist with PT.  When has been, has control when goes, and can tell when needs to go, but when goes, feels like something always left inside her.  Not sure if she needs bowel program or not.  ROS- denies CP, SOB, N/V/D  Objective:   No results found. Recent Labs    09/20/19 0613  WBC 8.2  HGB 11.7*  HCT 36.6  PLT 436*   Recent Labs    09/20/19 0613  NA 138  K 4.0  CL 103  CO2 27  GLUCOSE 105*  BUN 14  CREATININE 0.69  CALCIUM 8.7*    Intake/Output Summary (Last 24 hours) at 09/21/2019 0945 Last data filed at 09/21/2019 0730 Gross per 24 hour  Intake 360 ml  Output 0 ml  Net 360 ml     Physical Exam: Vital Signs Blood pressure (!) 109/52, pulse 73, temperature 97.7 F (36.5 C), resp. rate 17, height 5\' 6"  (1.676 m), weight 95.4 kg, SpO2 96 %.  Physical Exam Vitals  reviewed General: No acute distress; pleasant, sitting up in [facility], doing grooming at sink; therapy x2 in room, NNAD. Heart: Regular rate and rhythm Lungs: Clear to auscultation B/L Abdomen: (+)BS, soft, NT, ND Extremities: No clubbing, cyanosis, or edema Skin:, no dressing over the right anterior lower cervical incision. Neurologic: Decreased sensation to light touch on lateral aspect of R 4th digit and 5th digit c/w R ulnar neuropathy- not pinched nerve in neck  Alert and oriented x3, motor strength is 3 - in the right deltoid 3 - in the biceps triceps finger flexors and extensors Left upper extremity is 4 - at the deltoid bicep tricep finger flexors and extensors 4/5 in the hip flexors knee extensors ankle dorsiflexors plantar flexors bilaterally  lower extremities Fine motor is reduced bilateral hands with finger to thumb  opposition. Musculoskeletal: good ROM Neck range of motion not tested specifically, but has decreased cervical flexion/rotation/side bending seen on exam    Assessment/Plan: 1. Functional deficits secondary to Cervical myelopathy s/p C5/6 discectomy, C6-7 decompression and plates and screws arthrodesis  to  which require 3+ hours per day of interdisciplinary therapy in a comprehensive inpatient rehab setting.  Physiatrist is providing close team supervision and 24 hour management of active medical problems listed below.  Physiatrist and rehab team continue to assess barriers to discharge/monitor patient progress toward functional and medical goals  Care Tool:  Bathing    Body parts bathed by patient: Face   Body parts bathed by helper: Right arm, Left arm, Chest, Abdomen, Front perineal area, Buttocks, Right upper leg, Right lower leg, Left upper leg, Left lower leg     Bathing assist Assist Level: Maximal Assistance - Patient 24 - 49%     Upper Body Dressing/Undressing Upper body dressing   What is the patient wearing?: Pull over shirt    Upper body assist Assist Level: Contact Guard/Touching assist    Lower Body Dressing/Undressing Lower body dressing      What is the patient wearing?: Pants     Lower body assist Assist for lower body dressing: Moderate Assistance - Patient 50 - 74%     Toileting Toileting    Toileting assist Assist for toileting: 2 Helpers     Transfers Chair/bed transfer  Transfers assist  Chair/bed transfer assist level: Moderate Assistance - Patient 50 - 74%     Locomotion Ambulation   Ambulation assist   Ambulation activity did not occur: Safety/medical concerns  Assist level: Maximal Assistance - Patient 25 - 49% Assistive device: Parallel bars Max distance: 3'   Walk 10 feet activity   Assist  Walk 10 feet activity did not occur: Safety/medical concerns        Walk 50 feet activity   Assist Walk 50 feet with  2 turns activity did not occur: Safety/medical concerns         Walk 150 feet activity   Assist Walk 150 feet activity did not occur: Safety/medical concerns         Walk 10 feet on uneven surface  activity   Assist Walk 10 feet on uneven surfaces activity did not occur: Safety/medical concerns         Wheelchair     Assist Will patient use wheelchair at discharge?: Yes Type of Wheelchair: Manual Wheelchair activity did not occur: Safety/medical concerns  Wheelchair assist level: Supervision/Verbal cueing Max wheelchair distance: 40    Wheelchair 50 feet with 2 turns activity    Assist    Wheelchair 50 feet with 2 turns activity did not occur: Safety/medical concerns   Assist Level: Minimal Assistance - Patient > 75%   Wheelchair 150 feet activity     Assist  Wheelchair 150 feet activity did not occur: Safety/medical concerns       Blood pressure (!) 109/52, pulse 73, temperature 97.7 F (36.5 C), resp. rate 17, height 5\' 6"  (1.676 m), weight 95.4 kg, SpO2 96 %.    Medical Problem List and Plan: 1.Right upper and lower extremity weaknesssecondary to cervical myelopathy.S/Pdiscectomy C5-6, 6-7 decompression placement of anterior instrumentation interbody plate and screws arthrodesis 09/09/2019. Cervical collar when out of bed -patient may shower -ELOS/Goals: 10-14d, supervision with mobility, min assist showering and lower extremity dressing mod I/supervision upper limb ADL 2. Antithrombotics: -DVT/anticoagulation:SCDs. Check vascular study 11/24- Dopplers (-) however has been since 11/19 since surgery- will see if can get Lovenox started  11/25- started lovenox- OK'd by [facility].  -antiplatelet therapy: N/A 3. Pain Management:Oxycodone and Robaxin as needed  11/24- will start Gabapentin 300 mg BID for nerve pain and titrate up as required  11/26- helping nerve pain 4. Mood:Provide emotional  support -antipsychotic agents: N/A 5. Neuropsych: This patientiscapable of making decisions on herown behalf. 6. Skin/Wound Care:Routine skin checks 7. Fluids/Electrolytes/Nutrition:Routine in and outs with follow-up chemistries 8. 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 Flomax 0.4 mg daily. Check PVR  11/24- is requiring in/out caths- will cont' flomax and see if gets return  11/25- increase Flomax to 0.8 mg nightly for bladder emptying.  11/26- still intermittently needing cathed.  11/27- voiding better 9.  Neurogenic bowel.   11/24- will see how does, but likely will need bowel program.   11/26- so far, is going- not sure if emptying  11/28- did suppository last night- was helpful- might need nightly, but will monitor  11/29- having BMs on toilet, it sounds like, but not documented if on toilet- required some dig stim per nursing note and I/o cath last night- so will order bowel program for now.  12/1- having BMs and has control of them, but cannot fully empty all the time, per pt- will keep bowel program prn.  10. Hypokalemia  11/24- will replete since K+ is 3.3- replete with 40 mEq x2 today and recheck later this week.  11/26- recheck in AM and then order weekly labs.  11/27- K+ 4.2  12/1- K+ 4.0 11. R ulnar neuropathy  11/29- explained what it was- why she likely had it- to avoid compressing nerve by resting arm on W/C arm rest- will likely get better if doesn't compress nerve.   12/1: improving, patient very happy about this   LOS: 8 days A FACE TO FACE EVALUATION WAS PERFORMED  Domnique Vanegas 09/21/2019, 9:45 AM

## 2019-09-22 ENCOUNTER — Inpatient Hospital Stay (HOSPITAL_COMMUNITY): Payer: Self-pay | Admitting: Physical Therapy

## 2019-09-22 ENCOUNTER — Inpatient Hospital Stay (HOSPITAL_COMMUNITY): Payer: Self-pay

## 2019-09-22 ENCOUNTER — Inpatient Hospital Stay (HOSPITAL_COMMUNITY): Payer: Self-pay | Admitting: Occupational Therapy

## 2019-09-22 NOTE — Plan of Care (Signed)
  Problem: Consults Goal: RH SPINAL CORD INJURY PATIENT EDUCATION Description:  See Patient Education module for education specifics.  Outcome: Progressing   Problem: SCI BOWEL ELIMINATION Goal: RH STG MANAGE BOWEL WITH ASSISTANCE Description: STG Manage Bowel with min Assistance. Outcome: Progressing Goal: RH STG SCI MANAGE BOWEL WITH MEDICATION WITH ASSISTANCE Description: STG SCI Manage bowel with medication with min assistance. Outcome: Progressing Goal: RH STG SCI MANAGE BOWEL PROGRAM W/ASSIST OR AS APPROPRIATE Description: STG SCI Manage bowel program with min assist or as appropriate. Outcome: Progressing   Problem: SCI BLADDER ELIMINATION Goal: RH OTHER STG BLADDER ELIMINATION GOALS W/ASSIST Description: Other STG Bladder Elimination Goals With min Assistance Outcome: Progressing   Problem: RH SAFETY Goal: RH STG ADHERE TO SAFETY PRECAUTIONS W/ASSISTANCE/DEVICE Description: STG Adhere to Safety Precautions With cues and reminders Outcome: Progressing   Problem: RH PAIN MANAGEMENT Goal: RH STG PAIN MANAGED AT OR BELOW PT'S PAIN GOAL Description: Pain level less than 4 on scale of 0-10 Outcome: Progressing   Problem: RH KNOWLEDGE DEFICIT SCI Goal: RH STG INCREASE KNOWLEDGE OF SELF CARE AFTER SCI Description: Pt will be able to demonstrate understanding of fall prevention and proper steps to call for help in case of emergency independently upon discharge. Pt will be able to identify and eliminate environmental hazards that can lead to pt's falling independently.  Outcome: Progressing   

## 2019-09-22 NOTE — Progress Notes (Signed)
Social Work Patient ID: Sandra Parrish, female   DOB: 1946-07-09, 73 y.o.   MRN: [id number]   Met with pt yesterday to review team conference.  Aware that team did not yet set targeted d/c date.  Discussed goals set for min assist overall.  Pt upset with this as she does not have a way to arrange 24/7 support at home.  We had discussed, on initial assessment interview, the possible need for SNF if she is unable to reach a mod ind LOF, She asks "why are they thinking that's what I need.  I'm trying as hard as I can."  Provided support and explained that the team was not saying that these goals were being set because of her lack of work.  Allowed her to talk about her concerns and fears of "not being able to go home."   Will discuss further with team and follow back up with pt as well.  Sandra Sorrels, LCSW

## 2019-09-22 NOTE — Progress Notes (Signed)
PHYSICAL MEDICINE & REHABILITATION PROGRESS NOTE   Subjective/Complaints:  Pt reports  Had a nice BM around 4am- when woke up and went back to sleep.  No caths in 48 hours.  ROS- denies CP, SOB, N/V/D  Objective:   No results found. Recent Labs    09/20/19 0613  WBC 8.2  HGB 11.7*  HCT 36.6  PLT 436*   Recent Labs    09/20/19 0613  NA 138  K 4.0  CL 103  CO2 27  GLUCOSE 105*  BUN 14  CREATININE 0.69  CALCIUM 8.7*    Intake/Output Summary (Last 24 hours) at 09/22/2019 0803 Last data filed at 09/22/2019 0510 Gross per 24 hour  Intake 240 ml  Output 133 ml  Net 107 ml     Physical Exam: Vital Signs Blood pressure 117/62, pulse 76, temperature (!) 97.1 F (36.2 C), resp. rate 17, height 5\' 6"  (1.676 m), weight 95.4 kg, SpO2 95 %.  Physical Exam Vitals  reviewed General: No acute distress; pleasant, sitting up in bed; trying to get set up for breakfast, NAD Heart: Regular rate and rhythm Lungs: Clear to auscultation B/L Abdomen: (+)BS, soft, NT, ND Extremities: No clubbing, cyanosis, or edema Skin:, no dressing over the right anterior lower cervical incision. Neurologic: Decreased sensation to light touch on lateral aspect of R 4th digit and 5th digit c/w R ulnar neuropathy- not pinched nerve in neck  Alert and oriented x3, motor strength is 3 - in the right deltoid 3 - in the biceps triceps finger flexors and extensors Left upper extremity is 4 - at the deltoid bicep tricep finger flexors and extensors 4/5 in the hip flexors knee extensors ankle dorsiflexors plantar flexors bilaterally  lower extremities Fine motor is reduced bilateral hands with finger to thumb opposition. Musculoskeletal: good ROM Neck range of motion not tested specifically, but has decreased cervical flexion/rotation/side bending seen on exam    Assessment/Plan: 1. Functional deficits secondary to Cervical myelopathy s/p C5/6 discectomy, C6-7 decompression and plates and  screws arthrodesis  to  which require 3+ hours per day of interdisciplinary therapy in a comprehensive inpatient rehab setting.  Physiatrist is providing close team supervision and 24 hour management of active medical problems listed below.  Physiatrist and rehab team continue to assess barriers to discharge/monitor patient progress toward functional and medical goals  Care Tool:  Bathing    Body parts bathed by patient: Right arm, Left arm, Chest, Abdomen, Right upper leg, Left upper leg, Right lower leg, Left lower leg, Face   Body parts bathed by helper: Front perineal area, Buttocks     Bathing assist Assist Level: Moderate Assistance - Patient 50 - 74%     Upper Body Dressing/Undressing Upper body dressing   What is the patient wearing?: Pull over shirt    Upper body assist Assist Level: Set up assist    Lower Body Dressing/Undressing Lower body dressing      What is the patient wearing?: Pants     Lower body assist Assist for lower body dressing: Moderate Assistance - Patient 50 - 74%     Toileting Toileting    Toileting assist Assist for toileting: Maximal Assistance - Patient 25 - 49%     Transfers Chair/bed transfer  Transfers assist     Chair/bed transfer assist level: Moderate Assistance - Patient 50 - 74%     Locomotion Ambulation   Ambulation assist   Ambulation activity did not occur: Safety/medical concerns  Assist level:  Maximal Assistance - Patient 25 - 49% Assistive device: Parallel bars Max distance: 5'   Walk 10 feet activity   Assist  Walk 10 feet activity did not occur: Safety/medical concerns        Walk 50 feet activity   Assist Walk 50 feet with 2 turns activity did not occur: Safety/medical concerns         Walk 150 feet activity   Assist Walk 150 feet activity did not occur: Safety/medical concerns         Walk 10 feet on uneven surface  activity   Assist Walk 10 feet on uneven surfaces activity did  not occur: Safety/medical concerns         Wheelchair     Assist Will patient use wheelchair at discharge?: Yes Type of Wheelchair: Manual Wheelchair activity did not occur: Safety/medical concerns  Wheelchair assist level: Contact Guard/Touching assist Max wheelchair distance: 30'    Wheelchair 50 feet with 2 turns activity    Assist    Wheelchair 50 feet with 2 turns activity did not occur: Safety/medical concerns   Assist Level: Minimal Assistance - Patient > 75%   Wheelchair 150 feet activity     Assist  Wheelchair 150 feet activity did not occur: Safety/medical concerns       Blood pressure 117/62, pulse 76, temperature (!) 97.1 F (36.2 C), resp. rate 17, height 5\' 6"  (1.676 m), weight 95.4 kg, SpO2 95 %.    Medical Problem List and Plan: 1.Right upper and lower extremity weaknesssecondary to cervical myelopathy.S/Pdiscectomy C5-6, 6-7 decompression placement of anterior instrumentation interbody plate and screws arthrodesis 09/09/2019. Cervical collar when out of bed -patient may shower -ELOS/Goals: 10-14d, supervision with mobility, min assist showering and lower extremity dressing mod I/supervision upper limb ADL 2. Antithrombotics: -DVT/anticoagulation:SCDs. Check vascular study 11/24- Dopplers (-) however has been since 11/19 since surgery- will see if can get Lovenox started  11/25- started lovenox- OK'd by [facility].  -antiplatelet therapy: N/A 3. Pain Management:Oxycodone and Robaxin as needed  11/24- will start Gabapentin 300 mg BID for nerve pain and titrate up as required  11/26- helping nerve pain 4. Mood:Provide emotional support -antipsychotic agents: N/A 5. Neuropsych: This patientiscapable of making decisions on herown behalf. 6. Skin/Wound Care:Routine skin checks 7. Fluids/Electrolytes/Nutrition:Routine in and outs with follow-up chemistries 8. 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 Flomax 0.4 mg daily. Check PVR  11/24- is requiring in/out caths- will cont' flomax and see if gets return  11/25- increase Flomax to 0.8 mg nightly for bladder emptying.  11/26- still intermittently needing cathed.  11/27- voiding better 9.  Neurogenic bowel.   11/24- will see how does, but likely will need bowel program.   11/26- so far, is going- not sure if emptying  11/28- did suppository last night- was helpful- might need nightly, but will monitor  11/29- having BMs on toilet, it sounds like, but not documented if on toilet- required some dig stim per nursing note and I/o cath last night- so will order bowel program for now.  12/1- having BMs and has control of them, but cannot fully empty all the time, per pt- will keep bowel program prn.  12/2- no caths in 48 hrs.  10. Hypokalemia  11/24- will replete since K+ is 3.3- replete with 40 mEq x2 today and recheck later this week.  11/26- recheck in AM and then order weekly labs.  11/27- K+ 4.2  12/1- K+ 4.0 11. R ulnar neuropathy  11/29- explained what  it was- why she likely had it- to avoid compressing nerve by resting arm on W/C arm rest- will likely get better if doesn't compress nerve.   12/1: improving, patient very happy about this   LOS: 9 days A FACE TO FACE EVALUATION WAS PERFORMED  Stephen Turnbaugh 09/22/2019, 8:03 AM

## 2019-09-22 NOTE — Progress Notes (Addendum)
Occupational Therapy Weekly Progress Note  Patient Details  Name: Masiya Claassen MRN: [id number] Date of Birth: [birth year]  Beginning of progress report period: September 14, 2019 End of progress report period: September 22, 2019  Today's Date: 09/22/2019 OT Individual Time: 0800-0900 OT Individual Time Calculation (min): 60 min    Patient has met 3 of 3 short term goals. Pt is making excellent progress towards occupational therapy goals. Pt has been able to stand and transfer with RW with mod A overall. Pt has been performing UB self care with min A and LB self care with mod- max A. Pt does not have recommended 24/7 assist at home and will likely need additional OT intervention following discharge from inpatient rehab program    Patient continues to demonstrate the following deficits: muscle weakness, decreased cardiorespiratoy endurance, decreased coordination and decreased motor planning and decreased sitting balance, decreased standing balance and decreased balance strategies and therefore will continue to benefit from skilled OT intervention to enhance overall performance with BADL.  Patient progressing toward long term goals..  Continue plan of care.  OT Short Term Goals Week 1:  OT Short Term Goal 1 (Week 1): Pt will perform toilet transfer with assistance of 1 person. OT Short Term Goal 1 - Progress (Week 1): Met OT Short Term Goal 2 (Week 1): Pt will perform UB self care while seated unsupported with min guard. OT Short Term Goal 2 - Progress (Week 1): Met OT Short Term Goal 3 (Week 1): Pt will perform LB dressing with max A overall. OT Short Term Goal 3 - Progress (Week 1): Met Week 2:  OT Short Term Goal 1 (Week 2): Pt will consistently perform toilet transfer with RW and mod A. OT Short Term Goal 2 (Week 2): Pt will perform toileting with mod A overall. OT Short Term Goal 3 (Week 2): PT will maintaing standing balance with min A at sink for grooming tasks for 5  minutes.  Skilled Therapeutic Interventions/Progress Updates:    Upon entering the room, pt supine in bed with no c/o pain and agreeable to OT intervention. Pt performing supine >sit with mod A to EOB. OT educated and demonstrated use of sock aide with pt returning demonstrating with min cuing for technique but able to successfully don B socks. OT assisted pt with donning B shoes this session. Sit >stand from bed with mod lifting assistance and mod stand pivot transfer to the L with mod A and use of RW. Pt propelled wheelchair to sink and standing again with 3 minutes to brush teeth with built up handle on toothbrush. Pt's standing balance varied from min - mod A secondary to her having UEs unsupported at times with task. Pt returning to wheelchair and changing shirt. She was unable to unbutton shirt while wearing it but plans to practice buttoning later today. Pull over shirt donned with min A this session. Pt remained in wheelchair at end of session and trying sit <>stand x 2 reps with mod A and cuing for anterior weight shift and controlled sit. Pt remained in wheelchair with call bell and all needed items within reach.   Therapy Documentation Precautions:  Precautions Precautions: Fall, Cervical Precaution Comments: reviewed cervical precautions Cervical Brace: Soft collar Restrictions Weight Bearing Restrictions: No   Therapy/Group: Individual Therapy  Gypsy Decant 09/22/2019, 12:45 PM

## 2019-09-22 NOTE — Progress Notes (Signed)
Slept well overnight, remains continent and has not needed cath in almost 48 hours. Large continent BMs in am several days In a row. continues to need emotional support and reassurance

## 2019-09-22 NOTE — Progress Notes (Signed)
Physical Therapy Session Note  Patient Details  Name: Sandra Parrish MRN: [id number] Date of Birth: February 04, 1946  Today's Date: 09/22/2019 PT Individual Time: 1572-6203 PT Individual Time Calculation (min): 85 min   Short Term Goals: Week 2:  PT Short Term Goal 1 (Week 2): Pt will be at Supervision level for all w/c mobility PT Short Term Goal 2 (Week 2): Pt will perform bed mobility with min A consistently PT Short Term Goal 3 (Week 2): Pt will ambulate x 15 ft via least restrictive method  Skilled Therapeutic Interventions/Progress Updates:    Pt received seated in w/c in room, agreeable to PT session. No complaints of pain. Manual w/c propulsion x 40 ft with use of BUE and CGA for maneuvering corners. Pt requires increased time and cues to navigate corners but exhibits improved technique. Sit to stand with min to mod A to RW throughout session, mod A required with onset of fatigue. Standing mini-squats x 5 reps, x 10 reps with min A for balance with use of RW. Standing alt L/R marches with RW and min A for balance with R knee blocked, 2 x 10 reps to fatigue. Pt exhibits decreased R hip flexor strength and difficulty with lifting RLE. Pt also exhibits fair ability to maintain balance while standing on RLE with use of RW. Standing forward/backward steps with RW and mod A for balance with R knee blocked in stance x 5 reps each. Stand pivot transfer w/c to/from mat table and w/c to/from Nustep with RW and mod A. Nustep level 6 x 10 min with use of B UE/LE for global strengthening and neuromuscular re-education via reciprocal movement pattern, one seated rest break. Discussion with patient about current functional level and expected outcomes upon d/c. Pt reports feeling very emotional after team conference update yesterday and discouraged by her ongoing functional deficits and limitations. Education with patient that even though she is making progress in therapy she will still likely d/c home at a w/c  level and that stairs are not feasible for her to attempt at this time as she continues to progress with gait. Provided emotional support and encouragement to patient while also providing her with realistic expectations. Pt appreciative of education. Pt left seated in w/c in room with needs in reach at end of session.  Therapy Documentation Precautions:  Precautions Precautions: Fall, Cervical Precaution Comments: reviewed cervical precautions Cervical Brace: Soft collar Restrictions Weight Bearing Restrictions: No    Therapy/Group: Individual Therapy   Excell Seltzer, PT, DPT  09/22/2019, 1:38 PM

## 2019-09-22 NOTE — Progress Notes (Signed)
Physical Therapy Session Note  Patient Details  Name: Sandra Parrish MRN: [id number] Date of Birth: 02/27/1946  Today's Date: 09/22/2019 PT Individual Time: 1400-1515 PT Individual Time Calculation (min): 75 min   Short Term Goals: Week 2:  PT Short Term Goal 1 (Week 2): Pt will be at Supervision level for all w/c mobility PT Short Term Goal 2 (Week 2): Pt will perform bed mobility with min A consistently PT Short Term Goal 3 (Week 2): Pt will ambulate x 15 ft via least restrictive method  Skilled Therapeutic Interventions/Progress Updates: Pt presented in w/c agreeable to therapy. Pt transported to rehab gym and performed stand pivot transfer to mat with minA for STS transfer and minA for transfer to mat. Pt set up in [place] for standing activities. Pt then participated in standing march x 5 bilaterally with RW. Performed pre-gait stepping forward/backwards with RLE/LLE x 5 bilaterally. On last step pt took too large of a step with RLE and required maxA and use of sling to return pt to seated position. After seated rest pt participated in toe taps to target with pt indicating liked use of visual feedback to assist with foot placement. Pt ambulated x 7ft with maxA and RW with PTA providing verbal cues for quad activation and manual facilitation to increase lateral wt shift to facilitate foot clearance. For remainder of session pt participated in w/c mobility weaving through cones of varying sizes. PTA provided mod cues to improve negotiation of w/c with turns with pt demonstrating fair carryover. Pt transported pt to room and pt requesting if she can change to sleepwear but stay in w/c. Pt was able to negotiate w/c to dresser and sink with increased time and wash face and take off top with supervision. Due to fatigue pt performed STS in [place] and was able to maintain standing while PTA assisted with pulling down pants and donning nightgown over head with minA. Pt returned to w/c and PTA removed shoes  and pants. Pt set up in w/c with call bell within reach and needs met.      Therapy Documentation Precautions:  Precautions Precautions: Fall, Cervical Precaution Comments: reviewed cervical precautions Cervical Brace: Soft collar Restrictions Weight Bearing Restrictions: No    Therapy/Group: Individual Therapy  Rosita DeChalus  Rosita DeChalus, PTA  09/22/2019, 3:31 PM  

## 2019-09-23 ENCOUNTER — Inpatient Hospital Stay (HOSPITAL_COMMUNITY): Payer: Self-pay | Admitting: Physical Therapy

## 2019-09-23 ENCOUNTER — Inpatient Hospital Stay (HOSPITAL_COMMUNITY): Payer: Self-pay | Admitting: Occupational Therapy

## 2019-09-23 NOTE — Progress Notes (Signed)
Patient had non pitting edema of right ankle. Notified on-call provider, Danella Sensing. Ice applied, leg elevated, and night RN to continue monitoring.

## 2019-09-23 NOTE — Progress Notes (Signed)
Occupational Therapy Session Note  Patient Details  Name: Sandra Parrish MRN: [id number] Date of Birth: [birth year]  Today's Date: 09/23/2019 OT Individual Time: 1660-6301 OT Individual Time Calculation (min): 125 min    Short Term Goals: Week 2:  OT Short Term Goal 1 (Week 2): Pt will consistently perform toilet transfer with RW and mod A. OT Short Term Goal 2 (Week 2): Pt will perform toileting with mod A overall. OT Short Term Goal 3 (Week 2): PT will maintaing standing balance with min A at sink for grooming tasks for 5 minutes.  Skilled Therapeutic Interventions/Progress Updates:  Pt agreeable to combining two scheduled OT sessions 2/2 therapist's schedule, pt tolerating extended therapy session well    Pt seen for OT ADL session focusing on ADL re-training and functional standing balance/endurance.  Pt sitting up in w/c upon arrival, agreeable to tx session and denying pain, requesting to brush teeth and denying full bathing task. She self propelled w/c throughout room with supervision. Set-up needed grooming supplies from w/c level with increased time. Stood with min-mod A to complete oral care, R knee noted to be hyper-extended throughout stand without awareness, no episodes of buckling, tolerating standing ~3 minutes then with assist for controlled sit.  She returned to w/c level to dress. Use of reacher to assist with threading on pants. Stood with RW with min A to pull pants up, assist required to fully advance pants over hips. She was able to recall use of sock aid to don B socks with increased time. Provided with and education/demonstration provided for use of shoe funnel. With min A able to don shoes using shoe funnel with min A. UB dressing completed with increased time.  Pt taken to therapy gym. Positioned to side of // bars and addressed static standing balance with visual feedback of knee control. Min A to power into standing and using mirror for visual feedback     She was  taken to therapy day room to utilize high/low table as pt desiring to work on hand witting as she wasn't able to sign needed documentation as she wanted. Educated regarding importance of having elbow supported for proximal stability leading to distal control. Practiced print and cursive handwriting, tracing items and practicing with various widths and weights of pens/markers. Pt very appreciative of opportunity work on handwriting and proud that she can write her name legibly.  Pt returned to room  At end of session, left seated in w/c with all needs in reach.  Education/discussion throughout session regarding OT/PT goals, AE, activity progression and d/c planning.    Therapy Documentation Precautions:  Precautions Precautions: Fall, Cervical Precaution Comments: reviewed cervical precautions Cervical Brace: Soft collar Restrictions Weight Bearing Restrictions: No   Therapy/Group: Individual Therapy  Juanmiguel Defelice L 09/23/2019, 6:51 AM

## 2019-09-23 NOTE — Plan of Care (Signed)
  Problem: Consults Goal: RH SPINAL CORD INJURY PATIENT EDUCATION Description:  See Patient Education module for education specifics.  Outcome: Progressing   Problem: SCI BOWEL ELIMINATION Goal: RH STG MANAGE BOWEL WITH ASSISTANCE Description: STG Manage Bowel with min Assistance. Outcome: Progressing Goal: RH STG SCI MANAGE BOWEL WITH MEDICATION WITH ASSISTANCE Description: STG SCI Manage bowel with medication with min assistance. Outcome: Progressing Goal: RH STG SCI MANAGE BOWEL PROGRAM W/ASSIST OR AS APPROPRIATE Description: STG SCI Manage bowel program with min assist or as appropriate. Outcome: Progressing   Problem: SCI BLADDER ELIMINATION Goal: RH OTHER STG BLADDER ELIMINATION GOALS W/ASSIST Description: Other STG Bladder Elimination Goals With min Assistance Outcome: Progressing   Problem: RH SAFETY Goal: RH STG ADHERE TO SAFETY PRECAUTIONS W/ASSISTANCE/DEVICE Description: STG Adhere to Safety Precautions With cues and reminders Outcome: Progressing   Problem: RH PAIN MANAGEMENT Goal: RH STG PAIN MANAGED AT OR BELOW PT'S PAIN GOAL Description: Pain level less than 4 on scale of 0-10 Outcome: Progressing   Problem: RH KNOWLEDGE DEFICIT SCI Goal: RH STG INCREASE KNOWLEDGE OF SELF CARE AFTER SCI Description: Pt will be able to demonstrate understanding of fall prevention and proper steps to call for help in case of emergency independently upon discharge. Pt will be able to identify and eliminate environmental hazards that can lead to pt's falling independently.  Outcome: Progressing   

## 2019-09-23 NOTE — Progress Notes (Signed)
Physical Therapy Session Note  Patient Details  Name: Sandra Parrish MRN: [id number] Date of Birth: 20-Aug-1946  Today's Date: 09/23/2019 PT Individual Time: 1015-1125 PT Individual Time Calculation (min): 70 min   Short Term Goals: Week 2:  PT Short Term Goal 1 (Week 2): Pt will be at Supervision level for all w/c mobility PT Short Term Goal 2 (Week 2): Pt will perform bed mobility with min A consistently PT Short Term Goal 3 (Week 2): Pt will ambulate x 15 ft via least restrictive method  Skilled Therapeutic Interventions/Progress Updates:    Pt received seated in w/c in room, agreeable to PT session. Pt continues to report sensation of a "tight band" in midsection during therapy session with increased intensity towards end of session. Dependent transport via w/c to/from therapy gym for time conservation. Sit to stand in // bars with mod A. Standing alt L/R forward step with BUE support and mod A for balance, R knee blocked in stance. Stand pivot transfer w/c to/from mat table with RW and min to mod A. Sit to stand with min A to RW while in maxisky. Ambulation 3 x 5 ft forward/backward with RW while in maxisky overhead unweighting system with use of mirror for visual feedback for LE placement and control. Pt continues to exhibit fair control of RLE especially when taking steps backwards but exhibits improvement with visual feedback. Pt ambulates with narrow BOS with decreased B step length and step-to gait pattern. Pt fatigues quickly with gait training. Stand pivot transfer back to bed with RW and min A. Sit to stand with min A to RW to doff sweatpants and don thinner pants with max A to dependent. Sit to supine mod A for BLE management. Pt left semi-reclined in bed with needs in reach at end of session, bed alarm in place.  Therapy Documentation Precautions:  Precautions Precautions: Fall, Cervical Precaution Comments: reviewed cervical precautions Cervical Brace: Soft  collar Restrictions Weight Bearing Restrictions: No    Therapy/Group: Individual Therapy   Excell Seltzer, PT, DPT  09/23/2019, 12:43 PM

## 2019-09-23 NOTE — Progress Notes (Signed)
[place] PHYSICAL MEDICINE & REHABILITATION PROGRESS NOTE   Subjective/Complaints:  Pt reports doing fine- got the wrong breakfast this AM- didn't get cereal- got eggs/sausage, etc. Doing static standing in gym working on balance.   ROS- denies CP, SOB, N/V/D  Objective:   No results found. No results for input(s): WBC, HGB, HCT, PLT in the last 72 hours. No results for input(s): NA, K, CL, CO2, GLUCOSE, BUN, CREATININE, CALCIUM in the last 72 hours.  Intake/Output Summary (Last 24 hours) at 09/23/2019 1036 Last data filed at 09/23/2019 0700 Gross per 24 hour  Intake 480 ml  Output -  Net 480 ml     Physical Exam: Vital Signs Blood pressure 105/71, pulse 79, temperature 98 F (36.7 C), temperature source Oral, resp. rate 16, height 5\' 6"  (1.676 m), weight 95.4 kg, SpO2 94 %.  Physical Exam Vitals  reviewed General: No acute distress; pleasant, sitting in manual w/c, working with PT in gym, NAD Heart: Regular rate and rhythm Lungs: Clear to auscultation B/L Abdomen: (+)BS, soft, NT, ND Extremities: No clubbing, cyanosis, or edema Skin:, no dressing over the right anterior lower cervical incision. Neurologic: Decreased sensation to light touch on lateral aspect of R 4th digit and 5th digit c/w R ulnar neuropathy- not pinched nerve in neck  Alert and oriented x3, motor strength is 3 - in the right deltoid 3 - in the biceps triceps finger flexors and extensors Left upper extremity is 4 - at the deltoid bicep tricep finger flexors and extensors 4/5 in the hip flexors knee extensors ankle dorsiflexors plantar flexors bilaterally  lower extremities Fine motor is reduced bilateral hands with finger to thumb opposition. Musculoskeletal: good ROM Neck range of motion not tested specifically, but has decreased cervical flexion/rotation/side bending seen on exam    Assessment/Plan: 1. Functional deficits secondary to Cervical myelopathy s/p C5/6 discectomy, C6-7 decompression  and plates and screws arthrodesis  to  which require 3+ hours per day of interdisciplinary therapy in a comprehensive inpatient rehab setting.  Physiatrist is providing close team supervision and 24 hour management of active medical problems listed below.  Physiatrist and rehab team continue to assess barriers to discharge/monitor patient progress toward functional and medical goals  Care Tool:  Bathing    Body parts bathed by patient: Right arm, Left arm, Chest, Abdomen, Right upper leg, Left upper leg, Right lower leg, Left lower leg, Face   Body parts bathed by helper: Front perineal area, Buttocks     Bathing assist Assist Level: Moderate Assistance - Patient 50 - 74%     Upper Body Dressing/Undressing Upper body dressing   What is the patient wearing?: Button up shirt    Upper body assist Assist Level: Minimal Assistance - Patient > 75%    Lower Body Dressing/Undressing Lower body dressing      What is the patient wearing?: Pants     Lower body assist Assist for lower body dressing: Moderate Assistance - Patient 50 - 74%     Toileting Toileting    Toileting assist Assist for toileting: Maximal Assistance - Patient 25 - 49%     Transfers Chair/bed transfer  Transfers assist     Chair/bed transfer assist level: Moderate Assistance - Patient 50 - 74%     Locomotion Ambulation   Ambulation assist   Ambulation activity did not occur: Safety/medical concerns  Assist level: Maximal Assistance - Patient 25 - 49% Assistive device: Maxi Sky Max distance: 38ft   Walk 10 feet activity  Assist  Walk 10 feet activity did not occur: Safety/medical concerns        Walk 50 feet activity   Assist Walk 50 feet with 2 turns activity did not occur: Safety/medical concerns         Walk 150 feet activity   Assist Walk 150 feet activity did not occur: Safety/medical concerns         Walk 10 feet on uneven surface  activity   Assist Walk 10 feet  on uneven surfaces activity did not occur: Safety/medical concerns         Wheelchair     Assist Will patient use wheelchair at discharge?: Yes Type of Wheelchair: Manual Wheelchair activity did not occur: Safety/medical concerns  Wheelchair assist level: Contact Guard/Touching assist Max wheelchair distance: 40'    Wheelchair 50 feet with 2 turns activity    Assist    Wheelchair 50 feet with 2 turns activity did not occur: Safety/medical concerns   Assist Level: Minimal Assistance - Patient > 75%   Wheelchair 150 feet activity     Assist  Wheelchair 150 feet activity did not occur: Safety/medical concerns       Blood pressure 105/71, pulse 79, temperature 98 F (36.7 C), temperature source Oral, resp. rate 16, height 5\' 6"  (1.676 m), weight 95.4 kg, SpO2 94 %.    Medical Problem List and Plan: 1.Right upper and lower extremity weaknesssecondary to cervical myelopathy.S/Pdiscectomy C5-6, 6-7 decompression placement of anterior instrumentation interbody plate and screws arthrodesis 09/09/2019. Cervical collar when out of bed -patient may shower -ELOS/Goals: 10-14d, supervision with mobility, min assist showering and lower extremity dressing mod I/supervision upper limb ADL 2. Antithrombotics: -DVT/anticoagulation:SCDs. Check vascular study 11/24- Dopplers (-) however has been since 11/19 since surgery- will see if can get Lovenox started  11/25- started lovenox- OK'd by [facility].  -antiplatelet therapy: N/A 3. Pain Management:Oxycodone and Robaxin as needed  11/24- will start Gabapentin 300 mg BID for nerve pain and titrate up as required  11/26- helping nerve pain 4. Mood:Provide emotional support -antipsychotic agents: N/A 5. Neuropsych: This patientiscapable of making decisions on herown behalf. 6. Skin/Wound Care:Routine skin checks 7. Fluids/Electrolytes/Nutrition:Routine in and outs with  follow-up chemistries 8. 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 Flomax 0.4 mg daily. Check PVR  11/24- is requiring in/out caths- will cont' flomax and see if gets return  11/25- increase Flomax to 0.8 mg nightly for bladder emptying.  11/26- still intermittently needing cathed.  11/27- voiding better 9.  Neurogenic bowel.   11/24- will see how does, but likely will need bowel program.   11/26- so far, is going- not sure if emptying  11/28- did suppository last night- was helpful- might need nightly, but will monitor  11/29- having BMs on toilet, it sounds like, but not documented if on toilet- required some dig stim per nursing note and I/o cath last night- so will order bowel program for now.  12/1- having BMs and has control of them, but cannot fully empty all the time, per pt- will keep bowel program prn.  12/2- no caths in 48 hrs.  10. Hypokalemia  11/24- will replete since K+ is 3.3- replete with 40 mEq x2 today and recheck later this week.  11/26- recheck in AM and then order weekly labs.  11/27- K+ 4.2  12/1- K+ 4.0 11. R ulnar neuropathy  11/29- explained what it was- why she likely had it- to avoid compressing nerve by resting arm on W/C arm rest- will likely  get better if doesn't compress nerve.   12/1: improving, patient very happy about this   LOS: 10 days A FACE TO FACE EVALUATION WAS PERFORMED  Sandra Parrish 09/23/2019, 10:36 AM

## 2019-09-24 ENCOUNTER — Inpatient Hospital Stay (HOSPITAL_COMMUNITY): Payer: Self-pay | Admitting: Occupational Therapy

## 2019-09-24 ENCOUNTER — Inpatient Hospital Stay (HOSPITAL_COMMUNITY): Payer: Self-pay | Admitting: Physical Therapy

## 2019-09-24 NOTE — Progress Notes (Signed)
Physical Therapy Session Note  Patient Details  Name: Sandra Parrish MRN: [id number] Date of Birth: Mar 17, 1946  Today's Date: 09/24/2019 PT Individual Time: 1300-1420 PT Individual Time Calculation (min): 80 min   Short Term Goals: Week 2:  PT Short Term Goal 1 (Week 2): Pt will be at Supervision level for all w/c mobility PT Short Term Goal 2 (Week 2): Pt will perform bed mobility with min A consistently PT Short Term Goal 3 (Week 2): Pt will ambulate x 15 ft via least restrictive method  Skilled Therapeutic Interventions/Progress Updates: Patient presents in room, sitting up in w/c.  Shoes donned by PT.  Discussed w/ patient need for different shoes to accommodate use of brace, possible AFO for trial for improved ambulation.  Patient states will talk w/ friends to purchase or will purchase self.  Patient wheeled to therapy gym to conserve energy for treatment.  Patient performed multiple sit <> stand transfers w/ mod A and verbal cues for hand placement especially w/ initiation to stand.  Patient performed 2 x 5-10 forward stepping alternating LEs w/ mod A and verbal and occasional manual cueing for knee extension on right for weight-bearing.  Patient had 1 episode of knees buckling and lowered to mat table.  Patient performed 2 trials of standing toe-taps to 1 3/4" platform w/ manual cueing to right quads.  Patient performed side-stepping to right and left w/ RW and verbal cues for sequencing and speed with min to mod A for balance.  Patient required seated rest breaks d/t fatigue as well as statements of "too much pressure on my shoulders." Pt transported back to room via w/c for energy conservation. Stand pivot transfer back to bed with RW and mod A. Sit to supine mod A for BLE management. Pt left semi-reclined in bed with needs in reach, bed alarm in place at end of session.    Therapy Documentation Precautions:  Precautions Precautions: Fall, Cervical Precaution Comments: reviewed cervical  precautions Cervical Brace: Soft collar Restrictions Weight Bearing Restrictions: No General:   Vital Signs: Therapy Vitals Temp: 97.6 F (36.4 C) Pulse Rate: 87 Resp: 20 BP: 111/60 Patient Position (if appropriate): Lying Oxygen Therapy SpO2: 96 % O2 Device: Room Air Pain:  Patient states burning in mid-back and shoulders and soreness in sacral area, but does not quantify.                          Therapy/Group: Individual Therapy Excell Seltzer, PT, DPT  09/24/2019, 3:44 PM

## 2019-09-24 NOTE — Progress Notes (Signed)
Occupational Therapy Session Note  Patient Details  Name: Sandra Parrish MRN: [id number] Date of Birth: 1946/07/05  Today's Date: 09/24/2019 OT Individual Time: 0350-0938 OT Individual Time Calculation (min): 73 min    Short Term Goals: Week 2:  OT Short Term Goal 1 (Week 2): Pt will consistently perform toilet transfer with RW and mod A. OT Short Term Goal 2 (Week 2): Pt will perform toileting with mod A overall. OT Short Term Goal 3 (Week 2): PT will maintaing standing balance with min A at sink for grooming tasks for 5 minutes.  Skilled Therapeutic Interventions/Progress Updates:    Pt seen for OT ADL bathing/dressing session. Pt sitting up in w/c upon arrival, denying pain and agreeable to tx session and plan for bathing at shower level. She gathered clothing items from w/c level from drawers using reacher with VCs for safety awareness to lock w/c brakes while completing dynamic tasks. Completed stand pivot transfer w/c<> TTB in shower with use of grab bars. Min A overall with steadying assist provided throughout and VCS for technique. She bathed seated on TTB using LH sponge for LB and pt stood with use of grab bars and min A, buttock hygiene completed total A. She transferred out of shower in same manner as described above. She dressed seated in w/c. Able to thread LEs into pants without need of reacher. Stood with RW and min-mod A and steadying assist provided while pt pulled up pants, able to let go of RW with B hands in order to pull pants up entirely.   TED hose donned total A and pt used sock aid to don socks with min A to thread sock on aid and to pull up completely.  Pt left seated in w/c at end of session, all needs in reach.  Shoes removed at start of session. Noted redding/iritation on B feet at pt's bunion and top of toes. RN made aware. Recommended to pt to wear socks outside of therapy time and shoes only during tx sessions. Pt with decreased sensation in B LEs limiting her  ability to feel irritation and potential skin breakdown.   Therapy Documentation Precautions:  Precautions Precautions: Fall, Cervical Precaution Comments: reviewed cervical precautions Cervical Brace: Soft collar Restrictions Weight Bearing Restrictions: No Pain:     Therapy/Group: Individual Therapy  Beatrice Ziehm L 09/24/2019, 6:55 AM

## 2019-09-24 NOTE — Progress Notes (Signed)
Physical Therapy Session Note  Patient Details  Name: Sandra Parrish MRN: [id number] Date of Birth: May 04, 1946  Today's Date: 09/24/2019 PT Individual Time: 0800-0900 PT Individual Time Calculation (min): 60 min   Short Term Goals: Week 2:  PT Short Term Goal 1 (Week 2): Pt will be at Supervision level for all w/c mobility PT Short Term Goal 2 (Week 2): Pt will perform bed mobility with min A consistently PT Short Term Goal 3 (Week 2): Pt will ambulate x 15 ft via least restrictive method  Skilled Therapeutic Interventions/Progress Updates:    Session 1: Pt received seated in bed, agreeable to PT session. Pt continues to complain of "band-like" sensation around mid-trunk region, MD in during session and explains to patient origin of pain related to sensory level of spinal cord injury. Supine to sit with max A from flat bed with use of bedrail, assist for LE management and trunk control. Dependent to don TED hose and shoes for time conservation. Stand pivot transfer bed to w/c with min A. Sit to stand with mod A to sink. Standing balance with min A while brushing teeth standing at sink with use of trunk against sink for stability. Manual w/c propulsion x 100 ft with use of BUE with 2 turns, min cueing for technique. Pt exhibits improved ability to perform w/c mobility with increased independence this date. Stand pivot transfer w/c to/from Nustep with RW and min A. Nustep level 4-6 x 5 min with use of B UE/LE for NMR via forced use and reciprocal movement pattern. Pt left seated in w/c in room with needs in reach at end of session.  Therapy Documentation Precautions:  Precautions Precautions: Fall, Cervical Precaution Comments: reviewed cervical precautions Cervical Brace: Soft collar Restrictions Weight Bearing Restrictions: No    Therapy/Group: Individual Therapy   Excell Seltzer, PT, DPT  09/24/2019, 12:31 PM

## 2019-09-24 NOTE — Plan of Care (Signed)
  Problem: Consults Goal: RH SPINAL CORD INJURY PATIENT EDUCATION Description:  See Patient Education module for education specifics.  Outcome: Progressing   Problem: SCI BOWEL ELIMINATION Goal: RH STG MANAGE BOWEL WITH ASSISTANCE Description: STG Manage Bowel with min Assistance. Outcome: Progressing Goal: RH STG SCI MANAGE BOWEL WITH MEDICATION WITH ASSISTANCE Description: STG SCI Manage bowel with medication with min assistance. Outcome: Progressing Goal: RH STG SCI MANAGE BOWEL PROGRAM W/ASSIST OR AS APPROPRIATE Description: STG SCI Manage bowel program with min assist or as appropriate. Outcome: Progressing   Problem: SCI BLADDER ELIMINATION Goal: RH OTHER STG BLADDER ELIMINATION GOALS W/ASSIST Description: Other STG Bladder Elimination Goals With min Assistance Outcome: Progressing   Problem: RH SAFETY Goal: RH STG ADHERE TO SAFETY PRECAUTIONS W/ASSISTANCE/DEVICE Description: STG Adhere to Safety Precautions With cues and reminders Outcome: Progressing   Problem: RH PAIN MANAGEMENT Goal: RH STG PAIN MANAGED AT OR BELOW PT'S PAIN GOAL Description: Pain level less than 4 on scale of 0-10 Outcome: Progressing   Problem: RH KNOWLEDGE DEFICIT SCI Goal: RH STG INCREASE KNOWLEDGE OF SELF CARE AFTER SCI Description: Pt will be able to demonstrate understanding of fall prevention and proper steps to call for help in case of emergency independently upon discharge. Pt will be able to identify and eliminate environmental hazards that can lead to pt's falling independently.  Outcome: Progressing   

## 2019-09-25 ENCOUNTER — Inpatient Hospital Stay (HOSPITAL_COMMUNITY): Payer: Self-pay | Admitting: Physical Therapy

## 2019-09-25 NOTE — Progress Notes (Signed)
Physical Therapy Session Note  Patient Details  Name: Sandra Parrish MRN: [id number] Date of Birth: 09-Oct-1946  Today's Date: 09/25/2019 PT Individual Time: 1000-1100 PT Individual Time Calculation (min): 60 min   Short Term Goals: Week 2:  PT Short Term Goal 1 (Week 2): Pt will be at Supervision level for all w/c mobility PT Short Term Goal 2 (Week 2): Pt will perform bed mobility with min A consistently PT Short Term Goal 3 (Week 2): Pt will ambulate x 15 ft via least restrictive method  Skilled Therapeutic Interventions/Progress Updates:   Pt received sitting in WC and agreeable to PT. WC mobility x 139f with supervision assist and min cues for turning technique and doorway management.   [facility]<>[facility] throughout treatment session x 15, intermittent assist from PT to improve stability in the RLE and improve symmetry of Weighty bearing.   PT instructed pt in standing therex with BUE support on outside of parallel bar:  Mini sqauts x 10 Lateral stepping x 5 BLE  Reciprocal foot taps on 2 inch step  Cues from posture and full ROM on the RLE as well as improved BOS throughout exercises; no knee instability noted throughout  Gait training with RW x 152fwith min assist and +2 for WC follow. Min cues for decreased step length, improved step height, and  Terminal knee extension on the R to prevent knee collapse.   nustep reciprocal movement training x 10 min with BUE and LE. Pt then [id number] assist from PT and min cues for sequencing of gait pattern to improve safety in turn.  Patient returned to room and [facility] reach and all needs met.          Therapy Documentation Precautions:  Precautions Precautions: Fall, Cervical Precaution Comments: reviewed cervical precautions Cervical Brace: Soft collar Restrictions Weight Bearing Restrictions: No Pain: denies  Therapy/Group: Individual  Therapy  AusLorie Phenix/02/2019, 5:18 PM

## 2019-09-25 NOTE — Progress Notes (Signed)
[place] PHYSICAL MEDICINE & REHABILITATION PROGRESS NOTE   Subjective/Complaints:  No issues overntie cont of bowels, not requiring supp COnt of bladder no ICP needed  Occ R 5th dig tingling  ROS- denies CP, SOB, N/V/D  Objective:   No results found. No results for input(s): WBC, HGB, HCT, PLT in the last 72 hours. No results for input(s): NA, K, CL, CO2, GLUCOSE, BUN, CREATININE, CALCIUM in the last 72 hours.  Intake/Output Summary (Last 24 hours) at 09/25/2019 0816 Last data filed at 09/24/2019 1816 Gross per 24 hour  Intake 580 ml  Output -  Net 580 ml     Physical Exam: Vital Signs Blood pressure 117/64, pulse 67, temperature 97.8 F (36.6 C), temperature source Oral, resp. rate 16, height 5\' 6"  (1.676 m), weight 95.4 kg, SpO2 96 %.  Physical Exam Vitals  reviewed General: No acute distress; pleasant, sitting in manual w/c, working with PT in gym, NAD Heart: Regular rate and rhythm Lungs: Clear to auscultation B/L Abdomen: (+)BS, soft, NT, ND Extremities: No clubbing, cyanosis, or edema Skin:, no dressing over the right anterior lower cervical incision. Neurologic: Decreased sensation to light touch on lateral aspect of R 4th digit and 5th digit c/w R ulnar neuropathy- not pinched nerve in neck  Alert and oriented x3, motor strength is 3 - in the right deltoid 3 - in the biceps triceps finger flexors and extensors Left upper extremity is 4 - at the deltoid bicep tricep finger flexors and extensors 4/5 in the hip flexors knee extensors ankle dorsiflexors plantar flexors bilaterally  lower extremities Fine motor is reduced bilateral hands with finger to thumb opposition. Musculoskeletal: good ROM Neck range of motion not tested specifically, but has decreased cervical flexion/rotation/side bending seen on exam    Assessment/Plan: 1. Functional deficits secondary to Cervical myelopathy s/p C5/6 discectomy, C6-7 decompression and plates and screws arthrodesis  to   which require 3+ hours per day of interdisciplinary therapy in a comprehensive inpatient rehab setting.  Physiatrist is providing close team supervision and 24 hour management of active medical problems listed below.  Physiatrist and rehab team continue to assess barriers to discharge/monitor patient progress toward functional and medical goals  Care Tool:  Bathing    Body parts bathed by patient: Right arm, Left arm, Chest, Abdomen, Right upper leg, Left upper leg, Right lower leg, Left lower leg, Face, Front perineal area   Body parts bathed by helper: Buttocks     Bathing assist Assist Level: Minimal Assistance - Patient > 75%     Upper Body Dressing/Undressing Upper body dressing   What is the patient wearing?: Pull over shirt    Upper body assist Assist Level: Supervision/Verbal cueing    Lower Body Dressing/Undressing Lower body dressing      What is the patient wearing?: Pants     Lower body assist Assist for lower body dressing: Contact Guard/Touching assist     Toileting Toileting    Toileting assist Assist for toileting: Maximal Assistance - Patient 25 - 49%     Transfers Chair/bed transfer  Transfers assist     Chair/bed transfer assist level: Moderate Assistance - Patient 50 - 74%     Locomotion Ambulation   Ambulation assist   Ambulation activity did not occur: Safety/medical concerns  Assist level: Maximal Assistance - Patient 25 - 49% Assistive device: Maxi Sky Max distance: 5'   Walk 10 feet activity   Assist  Walk 10 feet activity did not occur: Safety/medical concerns  Walk 50 feet activity   Assist Walk 50 feet with 2 turns activity did not occur: Safety/medical concerns         Walk 150 feet activity   Assist Walk 150 feet activity did not occur: Safety/medical concerns         Walk 10 feet on uneven surface  activity   Assist Walk 10 feet on uneven surfaces activity did not occur: Safety/medical  concerns         Wheelchair     Assist Will patient use wheelchair at discharge?: Yes Type of Wheelchair: Manual Wheelchair activity did not occur: Safety/medical concerns  Wheelchair assist level: Supervision/Verbal cueing Max wheelchair distance: 100'    Wheelchair 50 feet with 2 turns activity    Assist    Wheelchair 50 feet with 2 turns activity did not occur: Safety/medical concerns   Assist Level: Supervision/Verbal cueing   Wheelchair 150 feet activity     Assist  Wheelchair 150 feet activity did not occur: Safety/medical concerns       Blood pressure 117/64, pulse 67, temperature 97.8 F (36.6 C), temperature source Oral, resp. rate 16, height 5\' 6"  (1.676 m), weight 95.4 kg, SpO2 96 %.    Medical Problem List and Plan: 1.Right upper and lower extremity weaknesssecondary to cervical myelopathy.S/Pdiscectomy C5-6, 6-7 decompression placement of anterior instrumentation interbody plate and screws arthrodesis 09/09/2019. Cervical collar when out of bed -patient may shower -ELOS/Goals: 10-14d, supervision with mobility, min assist showering and lower extremity dressing mod I/supervision upper limb ADL 2. Antithrombotics: -DVT/anticoagulation:SCDs. Check vascular study 11/24- Dopplers (-)  11/25- started lovenox- OK'd by [facility].  -antiplatelet therapy: N/A 3. Pain Management:Oxycodone and Robaxin as needed  11/24- will start Gabapentin 300 mg BID for nerve pain and titrate up as required  11/26- helping nerve pain 4. Mood:Provide emotional support -antipsychotic agents: N/A 5. Neuropsych: This patientiscapable of making decisions on herown behalf. 6. Skin/Wound Care:Routine skin checks 7. Fluids/Electrolytes/Nutrition:Routine in and outs with follow-up chemistries 8. 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 Flomax 0.4 mg daily. Check PVR  No ICP needed , ? Wean flomax  9.   Neurogenic bowel.   Improved, no incont  No need for supp   On senna and docusate sodium  10. Hypokalemia  Resolved   11/27- K+ 4.2  12/1- K+ 4.0 11. R ulnar neuropathy  11/29- explained what it was- why she likely had it- to avoid compressing nerve by resting arm on W/C arm rest- will likely get better if doesn't compress nerve.   12/5 intermittent, habitually leans on RIght elbow    LOS: 12 days A FACE TO FACE EVALUATION WAS PERFORMED  Luanna Salk Maritza [place] 09/25/2019, 8:16 AM

## 2019-09-26 ENCOUNTER — Inpatient Hospital Stay (HOSPITAL_COMMUNITY): Payer: Self-pay

## 2019-09-26 NOTE — Progress Notes (Signed)
Physical Therapy Session Note  Patient Details  Name: Sandra Parrish MRN: [id number] Date of Birth: [birth year]  Today's Date: 09/26/2019 PT Individual Time: 3220-2542 PT Individual Time Calculation (min): 45 min   Short Term Goals: Week 2:  PT Short Term Goal 1 (Week 2): Pt will be at Supervision level for all w/c mobility PT Short Term Goal 2 (Week 2): Pt will perform bed mobility with min A consistently PT Short Term Goal 3 (Week 2): Pt will ambulate x 15 ft via least restrictive method  Skilled Therapeutic Interventions/Progress Updates:     Patient in w/c in room upon PT arrival. Patient alert and agreeable to PT session. Patient denied pain during session. Pt donned B TED hose and tennis shoes with total A for time management at beginning of session.  Therapeutic Activity: Transfers: Patient performed sit to/from stand x6 using a RW with mod-min A +2 x2 progressing to min A +1 throughout session. Provided verbal cues for scooting forward before standing, placing feet underneath her, leaning far forward to stand, and reaching back to sit with a controlled decent for safety.  Gait Training:  Patient ambulated 24 feet x2 using RW and +2 for w/c follow with min A for balance due to posterior lean. Ambulated with posterior lean, step-to gait pattern leading with R, R knee buckling with fatigue x2, decreased step length and height R>L, and flat foot at initial contact. Provided verbal cues for looking ahead to reduce strain on neck, increased BOS, and increased step height. She went up/down 3-3" steps using B rails with step-to gait pattern leading with L ascending and R descending (backwards) with min A. Provided cues for sequencing and manual facilitation for forward weight shift. Noted increased R knee buckling with fatigue.   Wheelchair Mobility:  Patient propelled wheelchair 110 feet x2 with supervision . Provided verbal cues for turning technique.  Patient in w/c in room  at end of  session with breaks locked, chair alarm set, and all needs within reach. Patient stated that she was very pleased with her progress this session. Became mildly tearful after performing stairs, stating that she was very proud of herself.    Therapy Documentation Precautions:  Precautions Precautions: Fall, Cervical Precaution Comments: reviewed cervical precautions Cervical Brace: Soft collar Restrictions Weight Bearing Restrictions: No    Therapy/Group: Individual Therapy  Sascha Baugher L Apollonia Amini PT, DPT  09/26/2019, 4:31 PM

## 2019-09-26 NOTE — Progress Notes (Signed)
[facility] PHYSICAL MEDICINE & REHABILITATION PROGRESS NOTE   Subjective/Complaints:  No issues overntie cont of bowels, not requiring supp COnt of bladder no ICP needed  Occ R 5th dig tingling  ROS- denies CP, SOB, N/V/D  Objective:   No results found. No results for input(s): WBC, HGB, HCT, PLT in the last 72 hours. No results for input(s): NA, K, CL, CO2, GLUCOSE, BUN, CREATININE, CALCIUM in the last 72 hours.  Intake/Output Summary (Last 24 hours) at 09/26/2019 0918 Last data filed at 09/25/2019 1251 Gross per 24 hour  Intake 360 ml  Output -  Net 360 ml     Physical Exam: Vital Signs Blood pressure 122/62, pulse 68, temperature 97.6 F (36.4 C), resp. rate 16, height 5\' 6"  (1.676 m), weight 95.4 kg, SpO2 94 %.  Physical Exam Vitals  reviewed General: No acute distress; pleasant, sitting in manual w/c, working with PT in gym, NAD Heart: Regular rate and rhythm Lungs: Clear to auscultation B/L Abdomen: (+)BS, soft, NT, ND Extremities: No clubbing, cyanosis, or edema Skin:, no dressing over the right anterior lower cervical incision. Neurologic: Decreased sensation to light touch on lateral aspect of R 4th digit and 5th digit c/w R ulnar neuropathy- not pinched nerve in neck  Right 5th digit in abducted position  Alert and oriented x3, motor strength is 3 - in the right deltoid 3 - in the biceps triceps finger flexors and extensors Left upper extremity is 4 - at the deltoid bicep tricep finger flexors and extensors 4/5 in the hip flexors knee extensors ankle dorsiflexors plantar flexors bilaterally  lower extremities Fine motor is reduced bilateral hands with finger to thumb opposition. Musculoskeletal: good ROM Neck range of motion not tested specifically, but has decreased cervical flexion/rotation/side bending seen on exam    Assessment/Plan: 1. Functional deficits secondary to Cervical myelopathy s/p C5/6 discectomy, C6-7 decompression and plates and screws  arthrodesis  to  which require 3+ hours per day of interdisciplinary therapy in a comprehensive inpatient rehab setting.  Physiatrist is providing close team supervision and 24 hour management of active medical problems listed below.  Physiatrist and rehab team continue to assess barriers to discharge/monitor patient progress toward functional and medical goals  Care Tool:  Bathing    Body parts bathed by patient: Right arm, Left arm, Chest, Abdomen, Right upper leg, Left upper leg, Right lower leg, Left lower leg, Face, Front perineal area   Body parts bathed by helper: Buttocks     Bathing assist Assist Level: Minimal Assistance - Patient > 75%     Upper Body Dressing/Undressing Upper body dressing   What is the patient wearing?: Pull over shirt    Upper body assist Assist Level: Supervision/Verbal cueing    Lower Body Dressing/Undressing Lower body dressing      What is the patient wearing?: Pants     Lower body assist Assist for lower body dressing: Contact Guard/Touching assist     Toileting Toileting    Toileting assist Assist for toileting: Maximal Assistance - Patient 25 - 49%     Transfers Chair/bed transfer  Transfers assist     Chair/bed transfer assist level: Minimal Assistance - Patient > 75%     Locomotion Ambulation   Ambulation assist   Ambulation activity did not occur: Safety/medical concerns  Assist level: Minimal Assistance - Patient > 75% Assistive device: Walker-rolling Max distance: 15   Walk 10 feet activity   Assist  Walk 10 feet activity did not occur: Safety/medical  concerns  Assist level: Minimal Assistance - Patient > 75%     Walk 50 feet activity   Assist Walk 50 feet with 2 turns activity did not occur: Safety/medical concerns         Walk 150 feet activity   Assist Walk 150 feet activity did not occur: Safety/medical concerns         Walk 10 feet on uneven surface  activity   Assist Walk 10  feet on uneven surfaces activity did not occur: Safety/medical concerns         Wheelchair     Assist Will patient use wheelchair at discharge?: Yes Type of Wheelchair: Manual Wheelchair activity did not occur: Safety/medical concerns  Wheelchair assist level: Supervision/Verbal cueing Max wheelchair distance: 110    Wheelchair 50 feet with 2 turns activity    Assist    Wheelchair 50 feet with 2 turns activity did not occur: Safety/medical concerns   Assist Level: Supervision/Verbal cueing   Wheelchair 150 feet activity     Assist  Wheelchair 150 feet activity did not occur: Safety/medical concerns       Blood pressure 122/62, pulse 68, temperature 97.6 F (36.4 C), resp. rate 16, height 5\' 6"  (1.676 m), weight 95.4 kg, SpO2 94 %.    Medical Problem List and Plan: 1.Right upper and lower extremity weaknesssecondary to cervical myelopathy.S/Pdiscectomy C5-6, 6-7 decompression placement of anterior instrumentation interbody plate and screws arthrodesis 09/09/2019. Cervical collar when out of bed -patient may shower -ELOS/Goals: 10-14d, supervision with mobility, min assist showering and lower extremity dressing mod I/supervision upper limb ADL 2. Antithrombotics: -DVT/anticoagulation:SCDs. Check vascular study 11/24- Dopplers (-)  11/25- started lovenox- OK'd by [facility].  -antiplatelet therapy: N/A 3. Pain Management:Oxycodone and Robaxin as needed  11/24- will start Gabapentin 300 mg BID for nerve pain and titrate up as required  11/26- helping nerve pain 4. Mood:Provide emotional support -antipsychotic agents: N/A 5. Neuropsych: This patientiscapable of making decisions on herown behalf. 6. Skin/Wound Care:Routine skin checks 7. Fluids/Electrolytes/Nutrition:Routine in and outs with follow-up chemistries, eating 75-100% meals  8. 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 Flomax 0.4 mg  daily. Check PVR  No ICP needed , ? Wean flomax  9.  Neurogenic bowel.   Improved, no incont  No need for supp   On senna and docusate sodium, last recorded BM 12/4  10. Hypokalemia  Resolved   11/27- K+ 4.2  12/1- K+ 4.0 11. R ulnar neuropathy  11/29- explained what it was- why she likely had it- to avoid compressing nerve by resting arm on W/C arm rest- will likely get better if doesn't compress nerve.   12/6 intermittent, habitually leans on RIght elbow , + Wartenbergs sign Symptomatically doing better, may6 need OP EMG     LOS: 13 days A FACE TO FACE EVALUATION WAS PERFORMED  Charlett Blake 09/26/2019, 9:18 AM

## 2019-09-27 ENCOUNTER — Inpatient Hospital Stay (HOSPITAL_COMMUNITY): Payer: Self-pay | Admitting: Occupational Therapy

## 2019-09-27 ENCOUNTER — Inpatient Hospital Stay (HOSPITAL_COMMUNITY): Payer: Self-pay | Admitting: Physical Therapy

## 2019-09-27 LAB — CBC WITH DIFFERENTIAL/PLATELET
Abs Immature Granulocytes: 0.03 10*3/uL (ref 0.00–0.07)
Basophils Absolute: 0.1 10*3/uL (ref 0.0–0.1)
Basophils Relative: 1 %
Eosinophils Absolute: 0.3 10*3/uL (ref 0.0–0.5)
Eosinophils Relative: 4 %
HCT: 37.9 % (ref 36.0–46.0)
Hemoglobin: 12.4 g/dL (ref 12.0–15.0)
Immature Granulocytes: 0 %
Lymphocytes Relative: 20 %
Lymphs Abs: 1.5 10*3/uL (ref 0.7–4.0)
MCH: 30.7 pg (ref 26.0–34.0)
MCHC: 32.7 g/dL (ref 30.0–36.0)
MCV: 93.8 fL (ref 80.0–100.0)
Monocytes Absolute: 0.5 10*3/uL (ref 0.1–1.0)
Monocytes Relative: 7 %
Neutro Abs: 5.1 10*3/uL (ref 1.7–7.7)
Neutrophils Relative %: 68 %
Platelets: 436 10*3/uL — ABNORMAL HIGH (ref 150–400)
RBC: 4.04 MIL/uL (ref 3.87–5.11)
RDW: 12.3 % (ref 11.5–15.5)
WBC: 7.5 10*3/uL (ref 4.0–10.5)
nRBC: 0 % (ref 0.0–0.2)

## 2019-09-27 LAB — BASIC METABOLIC PANEL
Anion gap: 11 (ref 5–15)
BUN: 11 mg/dL (ref 8–23)
CO2: 25 mmol/L (ref 22–32)
Calcium: 9 mg/dL (ref 8.9–10.3)
Chloride: 104 mmol/L (ref 98–111)
Creatinine, Ser: 0.75 mg/dL (ref 0.44–1.00)
GFR calc Af Amer: >60 mL/min (ref >60)
GFR calc non Af Amer: >60 mL/min (ref >60)
Glucose, Bld: 98 mg/dL (ref 70–99)
Potassium: 4 mmol/L (ref 3.5–5.1)
Sodium: 140 mmol/L (ref 135–145)

## 2019-09-27 NOTE — Plan of Care (Signed)
  Problem: Consults Goal: RH SPINAL CORD INJURY PATIENT EDUCATION Description:  See Patient Education module for education specifics.  Outcome: Progressing   Problem: SCI BOWEL ELIMINATION Goal: RH STG MANAGE BOWEL WITH ASSISTANCE Description: STG Manage Bowel with min Assistance. Outcome: Progressing Flowsheets (Taken 09/27/2019 1510) STG: Pt will manage bowels with assistance: 4-Minimum assistance Goal: RH STG SCI MANAGE BOWEL WITH MEDICATION WITH ASSISTANCE Description: STG SCI Manage bowel with medication with min assistance. Outcome: Progressing Goal: RH STG SCI MANAGE BOWEL PROGRAM W/ASSIST OR AS APPROPRIATE Description: STG SCI Manage bowel program with min assist or as appropriate. Outcome: Progressing Flowsheets (Taken 09/27/2019 1510) STG: SCI Pt will manage bowels with assistance or as appropriate: Minimum assist   Problem: SCI BLADDER ELIMINATION Goal: RH OTHER STG BLADDER ELIMINATION GOALS W/ASSIST Description: Other STG Bladder Elimination Goals With min Assistance Outcome: Progressing   Problem: RH SAFETY Goal: RH STG ADHERE TO SAFETY PRECAUTIONS W/ASSISTANCE/DEVICE Description: STG Adhere to Safety Precautions With cues and reminders Outcome: Progressing Flowsheets (Taken 09/27/2019 1510) STG:Pt will adhere to safety precautions with assistance/device: 4-Minimal assistance   Problem: RH PAIN MANAGEMENT Goal: RH STG PAIN MANAGED AT OR BELOW PT'S PAIN GOAL Description: Pain level less than 4 on scale of 0-10 Outcome: Progressing   Problem: RH KNOWLEDGE DEFICIT SCI Goal: RH STG INCREASE KNOWLEDGE OF SELF CARE AFTER SCI Description: Pt will be able to demonstrate understanding of fall prevention and proper steps to call for help in case of emergency independently upon discharge. Pt will be able to identify and eliminate environmental hazards that can lead to pt's falling independently.  Outcome: Progressing

## 2019-09-27 NOTE — Progress Notes (Signed)
Occupational Therapy Session Note  Patient Details  Name: Sandra Parrish MRN: [id number] Date of Birth: [birth year]  Today's Date: 09/27/2019 OT Individual Time: 0930-1045 and 1400-1500 OT Individual Time Calculation (min): 75 min and 60 min   Short Term Goals: Week 2:  OT Short Term Goal 1 (Week 2): Pt will consistently perform toilet transfer with RW and mod A. OT Short Term Goal 2 (Week 2): Pt will perform toileting with mod A overall. OT Short Term Goal 3 (Week 2): PT will maintaing standing balance with min A at sink for grooming tasks for 5 minutes.  Skilled Therapeutic Interventions/Progress Updates:    Session One: Pt seen for OT session focusing on functional transfers and mobility. Pt sitting up in w/c upon arrival, denied pain and agreeable to tx session. She requested to bathe during PM session. Discussed at length d/c planning, plan to d/c home with intermittent supervision from friends. Discussed w/c vs ambulatory level at d/c and safety recommendations. Cont with discussions and planning based on pt's progress. She completed x2 stand pivot transfers to [facility]. First trial completed with use of grab bar, second trial completed with RW. Pt agreed that stand pivot with RW looked safer vs use of grab bars. Safety plan updated for no longer needing use of STEDY to and complete stand pivot transfers with RW. Pt desiring to walk out of bathroom, completed with min-mod A to return to w/c. Pt so happy to "feel normal again"!. She completed w/c propulsion throughout unit with supervision and increased time for UE strengthening/endurance.  Completed ambulation weaving through cones to simulate home environment obstacles. Min A for straight line walking, mod A for turns. Extended seated rest break required. Pt requiring up to mod A for sit>stand with fatigue.  Cont to discuss pt's goals and responsibilities at home including IADLs, home accessibility, etc. Pt appears to have realistic goals,  however, cont to benefit from education for modified/adapted IADLs from w/c and RW level. Pt returned to room at end of session, left seated in w/c with all needs in reach.    Session Two: Pt seen for OT session focusing on functional transfers and ADL re-training. Pt received sitting on toilet, attempts to have BM, however, not successful and ready to get off toilet.  She stood from elevated toilet with steadying assist and once pants positioned within reach and was able to pull pants up with steadying assist. She ambulated out of bathroom with min A overall, x2 LOB episodes requiring mod A to regain.  Completed grooming tasks at sink. She set-up needed items from w/c level and then stood to complete grooming tasks with steadying assist, completing oral care and hand hygiene.  In ADL apartment, education/demonstration provided for TTB transfer for tub/shower transfer as pt has at home. She ambulated into bathroom with min A. Required assist to manage B LEs over tub wall and VCs for technique/moibility. Pt returned to room and transitioned to sitting EOB. Completed dressing task seated EOB, standing with RW and steadying assist to pull pants up. Mod A to return to supine at end of session, left in supine with LEs elevated and all needs in reach.   Therapy Documentation Precautions:  Precautions Precautions: Fall, Cervical Precaution Comments: reviewed cervical precautions Cervical Brace: Soft collar Restrictions Weight Bearing Restrictions: No   Therapy/Group: Individual Therapy  Addisynn Vassell L 09/27/2019, 6:59 AM

## 2019-09-27 NOTE — Progress Notes (Signed)
Physical Therapy Session Note  Patient Details  Name: Sierra Spargo MRN: [id number] Date of Birth: [birth year]  Today's Date: 09/27/2019 PT Individual Time: 0800-0900 PT Individual Time Calculation (min): 60 min   Short Term Goals: Week 2:  PT Short Term Goal 1 (Week 2): Pt will be at Supervision level for all w/c mobility PT Short Term Goal 2 (Week 2): Pt will perform bed mobility with min A consistently PT Short Term Goal 3 (Week 2): Pt will ambulate x 15 ft via least restrictive method  Skilled Therapeutic Interventions/Progress Updates:    Pt received seated in w/c in room, agreeable to PT session. No complaints of pain. Pt is dependent to don TED hose and tennis shoes for time conservation. Manual w/c propulsion x 150 ft with use of BUE at Supervision level. Pt is able to maneuver doorways, across thresholds, and turn around with min cueing for w/c management. Sit to stand with min A to RW throughout session. Ambulation x 25', x 30' with RW and min A for balance, close w/c follow for safety. Pt exhibits improved RLE control with gait with decreased ataxia during swing-through. Pt does exhibit decreased B step length and decreased heel strike with RLE. Pt exhibits decreased R knee control with onset of fatigue but no instance of buckling during gait. Stand pivot transfer w/c to/from mat table with RW and min A. Standing mini-squats x 10 reps with use of mirror for visual feedback, RW and min A for balance for BLE strengthening and NMR for knee control. Standing TKE x 10 reps B with use of blue theraband. Pt demos fair ability to perform exercise correctly with compensations noted with hip rotation. Pt left seated in w/c in room with needs in reach at end of session.  Therapy Documentation Precautions:  Precautions Precautions: Fall, Cervical Precaution Comments: reviewed cervical precautions Cervical Brace: Soft collar Restrictions Weight Bearing Restrictions: No    Therapy/Group:  Individual Therapy   Excell Seltzer, PT, DPT  09/27/2019, 12:31 PM

## 2019-09-27 NOTE — Progress Notes (Signed)
[place] PHYSICAL MEDICINE & REHABILITATION PROGRESS NOTE   Subjective/Complaints:  Pt reports was told about ulnar flossing- is planned by OT. For intermittent numbness of R hand in lateral 4th and all of 5th digit and ulnar side of hand.  Also very excited- walked 25 ft then 30 ft with RW with PT.  Having BMs and voiding. No issues.   ROS- denies CP, SOB, N/V/D  Objective:   No results found. No results for input(s): WBC, HGB, HCT, PLT in the last 72 hours. No results for input(s): NA, K, CL, CO2, GLUCOSE, BUN, CREATININE, CALCIUM in the last 72 hours.  Intake/Output Summary (Last 24 hours) at 09/27/2019 0947 Last data filed at 09/27/2019 0716 Gross per 24 hour  Intake 390 ml  Output -  Net 390 ml     Physical Exam: Vital Signs Blood pressure (!) 125/58, pulse 71, temperature 97.6 F (36.4 C), resp. rate 16, height 5\' 6"  (1.676 m), weight 95.4 kg, SpO2 94 %.  Physical Exam Vitals  reviewed General: No acute distress; pleasant, sitting in manual w/c, working with PT in hallway- just walked with PT, NAD Heart: Regular rate and rhythm Lungs: Clear to auscultation B/L Abdomen: (+)BS, soft, NT, ND Extremities: No LE edema Skin:, no dressing over the right anterior lower cervical incision. Neurologic: Decreased sensation to light touch on lateral aspect of R 4th digit and 5th digit c/w R ulnar neuropathy- not pinched nerve in neck  Right 5th digit in abducted position  Alert and oriented x3, motor strength is 3 - in the right deltoid 3 - in the biceps triceps finger flexors and extensors Left upper extremity is 4 - at the deltoid bicep tricep finger flexors and extensors 4/5 in the hip flexors knee extensors ankle dorsiflexors plantar flexors bilaterally  lower extremities Fine motor is reduced bilateral hands with finger to thumb opposition. Musculoskeletal: good ROM Neck range of motion not tested specifically, but has decreased cervical flexion/rotation/side bending  seen on exam    Assessment/Plan: 1. Functional deficits secondary to Cervical myelopathy s/p C5/6 discectomy, C6-7 decompression and plates and screws arthrodesis  to  which require 3+ hours per day of interdisciplinary therapy in a comprehensive inpatient rehab setting.  Physiatrist is providing close team supervision and 24 hour management of active medical problems listed below.  Physiatrist and rehab team continue to assess barriers to discharge/monitor patient progress toward functional and medical goals  Care Tool:  Bathing    Body parts bathed by patient: Right arm, Left arm, Chest, Abdomen, Right upper leg, Left upper leg, Right lower leg, Left lower leg, Face, Front perineal area   Body parts bathed by helper: Buttocks     Bathing assist Assist Level: Minimal Assistance - Patient > 75%     Upper Body Dressing/Undressing Upper body dressing   What is the patient wearing?: Pull over shirt    Upper body assist Assist Level: Supervision/Verbal cueing    Lower Body Dressing/Undressing Lower body dressing      What is the patient wearing?: Pants     Lower body assist Assist for lower body dressing: Contact Guard/Touching assist     Toileting Toileting    Toileting assist Assist for toileting: Maximal Assistance - Patient 25 - 49%     Transfers Chair/bed transfer  Transfers assist     Chair/bed transfer assist level: Minimal Assistance - Patient > 75%     Locomotion Ambulation   Ambulation assist   Ambulation activity did not occur: Safety/medical  concerns  Assist level: Minimal Assistance - Patient > 75% Assistive device: Walker-rolling Max distance: 24'   Walk 10 feet activity   Assist  Walk 10 feet activity did not occur: Safety/medical concerns  Assist level: Minimal Assistance - Patient > 75% Assistive device: Walker-rolling   Walk 50 feet activity   Assist Walk 50 feet with 2 turns activity did not occur: Safety/medical  concerns         Walk 150 feet activity   Assist Walk 150 feet activity did not occur: Safety/medical concerns         Walk 10 feet on uneven surface  activity   Assist Walk 10 feet on uneven surfaces activity did not occur: Safety/medical concerns         Wheelchair     Assist Will patient use wheelchair at discharge?: Yes Type of Wheelchair: Manual Wheelchair activity did not occur: Safety/medical concerns  Wheelchair assist level: Supervision/Verbal cueing Max wheelchair distance: 110'    Wheelchair 50 feet with 2 turns activity    Assist    Wheelchair 50 feet with 2 turns activity did not occur: Safety/medical concerns   Assist Level: Supervision/Verbal cueing   Wheelchair 150 feet activity     Assist  Wheelchair 150 feet activity did not occur: Safety/medical concerns       Blood pressure (!) 125/58, pulse 71, temperature 97.6 F (36.4 C), resp. rate 16, height 5\' 6"  (1.676 m), weight 95.4 kg, SpO2 94 %.    Medical Problem List and Plan: 1.Right upper and lower extremity weaknesssecondary to cervical myelopathy.S/Pdiscectomy C5-6, 6-7 decompression placement of anterior instrumentation interbody plate and screws arthrodesis 09/09/2019. Cervical collar when out of bed -patient may shower -ELOS/Goals: 10-14d, supervision with mobility, min assist showering and lower extremity dressing mod I/supervision upper limb ADL 2. Antithrombotics: -DVT/anticoagulation:SCDs. Check vascular study 11/24- Dopplers (-)  11/25- started lovenox- OK'd by [facility].  -antiplatelet therapy: N/A 3. Pain Management:Oxycodone and Robaxin as needed  11/24- will start Gabapentin 300 mg BID for nerve pain and titrate up as required  11/26- helping nerve pain 4. Mood:Provide emotional support -antipsychotic agents: N/A 5. Neuropsych: This patientiscapable of making decisions on herown behalf. 6. Skin/Wound  Care:Routine skin checks 7. Fluids/Electrolytes/Nutrition:Routine in and outs with follow-up chemistries, eating 75-100% meals  8. 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 Flomax 0.4 mg daily. Check PVR  No ICP needed , ? Wean flomax after 1 month 9.  Neurogenic bowel.   Improved, no incont  No need for supp   On senna and docusate sodium, last recorded BM 12/4  12/7- said she had LBM in last 24 hours  10. Hypokalemia  Resolved   11/27- K+ 4.2  12/1- K+ 4.0 11. R ulnar neuropathy  11/29- explained what it was- why she likely had it- to avoid compressing nerve by resting arm on W/C arm rest- will likely get better if doesn't compress nerve.   12/6 intermittent, habitually leans on RIght elbow , + Wartenbergs sign Symptomatically doing better, may need OP EMG     LOS: 14 days A FACE TO FACE EVALUATION WAS PERFORMED  Dhruvi Crenshaw 09/27/2019, 9:47 AM

## 2019-09-28 ENCOUNTER — Inpatient Hospital Stay (HOSPITAL_COMMUNITY): Payer: Self-pay

## 2019-09-28 ENCOUNTER — Inpatient Hospital Stay (HOSPITAL_COMMUNITY): Payer: Self-pay | Admitting: Physical Therapy

## 2019-09-28 ENCOUNTER — Inpatient Hospital Stay (HOSPITAL_COMMUNITY): Payer: Self-pay | Admitting: Occupational Therapy

## 2019-09-28 NOTE — Progress Notes (Signed)
Physical Therapy Weekly Progress Note  Patient Details  Name: Sandra Parrish MRN: [id number] Date of Birth: 10-14-1946  Beginning of progress report period: September 21, 2019 End of progress report period: September 28, 2019  Today's Date: 09/28/2019 PT Individual Time: 0900-1000 PT Individual Time Calculation (min): 60 min   Patient has met 2 of 3 short term goals.  Pt has made great progress towards therapy goals over the past week. Patient has progressed to a point where she will likely be able to be modified independent upon d/c home and LTG will be upgraded to reflect this progress. Pt is currently min to mod A for bed mobility, min A for stand pivot transfers with RW, min A for gait up to 30' with RW, min A to navigate 3" steps with 2 handrails, and at Supervision level for w/c mobility. Pt remains motivated with good motivation and participation in therapy sessions.  Patient continues to demonstrate the following deficits muscle weakness, abnormal tone and unbalanced muscle activation and decreased standing balance, decreased postural control and decreased balance strategies and therefore will continue to benefit from skilled PT intervention to increase functional independence with mobility.  Patient progressing toward long term goals..  Plan of care revisions: Upgraded goals from min A to mod I due to progress. Added gait goal and stair goal due to progress..  PT Short Term Goals Week 2:  PT Short Term Goal 1 (Week 2): Pt will be at Supervision level for all w/c mobility PT Short Term Goal 1 - Progress (Week 2): Met PT Short Term Goal 2 (Week 2): Pt will perform bed mobility with min A consistently PT Short Term Goal 2 - Progress (Week 2): Progressing toward goal PT Short Term Goal 3 (Week 2): Pt will ambulate x 15 ft via least restrictive method PT Short Term Goal 3 - Progress (Week 2): Met Week 3:  PT Short Term Goal 1 (Week 3): =LTG due to ELOS  Skilled Therapeutic  Interventions/Progress Updates:    Pt received seated in w/c in room, agreeable to PT session. No complaints of pain. Pt has purchased new tennis shoes for improved fit and use with standing and gait training. Pt is dependent to don TED hose and tennis shoes for time conservation. Manual w/c propulsion x 150 ft with use of BUE at Supervision level, increased time needed to complete. Sit to stand with min A to RW. Ambulation x 30 ft with RW and min A, some R knee instability but no instances of buckling. Trial gait with R GRAFO and RW x 30 ft with min A. Pt exhibits increased difficulty with advancing RLE with use of AFO, deferred use. Ascend/descend 4 x 3" steps with 2 handrails (descend backwards) and min A for balance. Pt does have several instances of R knee buckling when descending stairs but is able to maintain balance with min A and use of rails. Pt is able to provide pictures of stairs to enter her apartment (6 stairs with R handrail, 8 stairs with L handrail). Will continue to practice stairs during therapy sessions based upon patient's home environment. Provided home measurement sheet for patient to have her friends fill out to better assess home environment. Pt left seated in w/c in room with needs in reach at end of session.  Therapy Documentation Precautions:  Precautions Precautions: Fall, Cervical Precaution Comments: reviewed cervical precautions Cervical Brace: Soft collar Restrictions Weight Bearing Restrictions: No   Therapy/Group: Individual Therapy   Excell Seltzer, PT, DPT 09/28/2019,  12:21 PM

## 2019-09-28 NOTE — Progress Notes (Signed)
Occupational Therapy Session Note  Patient Details  Name: Sandra Parrish MRN: [id number] Date of Birth: 1946-08-18  Today's Date: 09/28/2019 OT Individual Time: 1100-1145 OT Individual Time Calculation (min): 45 min    Skilled Therapeutic Interventions/Progress Updates:    1:1 Therapeutic exercises with focus on right UE. Pt with continued abnormal sensations along ulnar nerve distributions resulting in decr functioning in right hand (ie decr grip strength). Performed ulnar nerve gliding exercises (MD recommendation). Pt with no other noted pain with exercises. Visual videos provided for pt to refer back to. Performed in upright unsupported sitting in the w/c with feet on the floor.    Therapy Documentation Precautions:  Precautions Precautions: Fall, Cervical Precaution Comments: reviewed cervical precautions Cervical Brace: Soft collar Restrictions Weight Bearing Restrictions: No Pain:  no c/o pain in session   Therapy/Group: Individual Therapy  W[facility]en [facility] 09/28/2019, 5:21 PM

## 2019-09-28 NOTE — Progress Notes (Signed)
Physical Therapy Session Note  Patient Details  Name: Sandra Parrish MRN: [id number] Date of Birth: 24-Dec-1945  Today's Date: 09/28/2019 PT Individual Time: 1500-1525 PT Individual Time Calculation (min): 25 min   Short Term Goals: Week 2:  PT Short Term Goal 1 (Week 2): Pt will be at Supervision level for all w/c mobility PT Short Term Goal 2 (Week 2): Pt will perform bed mobility with min A consistently PT Short Term Goal 3 (Week 2): Pt will ambulate x 15 ft via least restrictive method  Skilled Therapeutic Interventions/Progress Updates:   Pt in w/c and agreeable to therapy, denies pain. Total assist to don shoes. Worked on BLE strengthening from seated and standing as listed below. Intermittent, brief rest breaks 2/2 fatigue. Multiple sit<>stands to RW w/ min assist, verbal reminders for technique, to push up on armrests to boost into standing. CGA for static/dynamic standing balance. Ended session in w/c, RLE propped up on pillows for swelling management. Ice applied to ankle for swelling as well. All needs in reach.   BLE strengthening: -LAQs 3# ankle weights, 2x10 -seated knee marches 3x10 -standing partial knee bends 3x10 -isometric hip abduction 2x10 -adduction squeezes 2x10  Therapy Documentation Precautions:  Precautions Precautions: Fall, Cervical Precaution Comments: reviewed cervical precautions Cervical Brace: Soft collar Restrictions Weight Bearing Restrictions: No Vital Signs: Therapy Vitals Temp: 98.1 F (36.7 C) Temp Source: Oral Pulse Rate: 76 Resp: 18 BP: 108/62 Patient Position (if appropriate): Sitting Oxygen Therapy SpO2: 97 % O2 Device: Room Air  Therapy/Group: Individual Therapy  Trista Ciocca Clent Demark 09/28/2019, 3:36 PM

## 2019-09-28 NOTE — Progress Notes (Signed)
[place] PHYSICAL MEDICINE & REHABILITATION PROGRESS NOTE   Subjective/Complaints:  Pt reports going to bathroom with w/c and RW- instead of cera steady- doing well.  LBM this AM a few minutes ago. Voiding well- denies pain and any issues.    ROS- denies CP, SOB, N/V/D  Objective:   No results found. Recent Labs    09/27/19 1041  WBC 7.5  HGB 12.4  HCT 37.9  PLT 436*   Recent Labs    09/27/19 1041  NA 140  K 4.0  CL 104  CO2 25  GLUCOSE 98  BUN 11  CREATININE 0.75  CALCIUM 9.0    Intake/Output Summary (Last 24 hours) at 09/28/2019 0940 Last data filed at 09/28/2019 0700 Gross per 24 hour  Intake 540 ml  Output -  Net 540 ml     Physical Exam: Vital Signs Blood pressure 118/62, pulse 80, temperature 97.9 F (36.6 C), temperature source Oral, resp. rate 18, height 5\' 6"  (1.676 m), weight 95.4 kg, SpO2 93 %.  Physical Exam Vitals  reviewed General: No acute distress; pleasant, sitting up in manual w/c, in room; nursing in room, NAD Heart: Regular rate and rhythm Lungs: Clear to auscultation B/L Abdomen: (+)BS, soft, NT, ND Extremities: No LE edema Skin:, no dressing over the right anterior lower cervical incision.healing well  Neurologic: Decreased sensation to light touch on lateral aspect of R 4th digit and 5th digit c/w R ulnar neuropathy- not pinched nerve in neck  Right 5th digit in abducted position  Alert and oriented x3, motor strength is 3 - in the right deltoid 3 - in the biceps triceps finger flexors and extensors Left upper extremity is 4 - at the deltoid bicep tricep finger flexors and extensors 4/5 in the hip flexors knee extensors ankle dorsiflexors plantar flexors bilaterally  lower extremities Fine motor is reduced bilateral hands with finger to thumb opposition. Musculoskeletal: good ROM Neck range of motion not tested specifically, but has decreased cervical flexion/rotation/side bending seen on exam    Assessment/Plan: 1.  Functional deficits secondary to Cervical myelopathy s/p C5/6 discectomy, C6-7 decompression and plates and screws arthrodesis  to  which require 3+ hours per day of interdisciplinary therapy in a comprehensive inpatient rehab setting.  Physiatrist is providing close team supervision and 24 hour management of active medical problems listed below.  Physiatrist and rehab team continue to assess barriers to discharge/monitor patient progress toward functional and medical goals  Care Tool:  Bathing    Body parts bathed by patient: Right arm, Left arm, Chest, Abdomen, Right upper leg, Left upper leg, Right lower leg, Left lower leg, Face, Front perineal area   Body parts bathed by helper: Buttocks     Bathing assist Assist Level: Minimal Assistance - Patient > 75%     Upper Body Dressing/Undressing Upper body dressing   What is the patient wearing?: Pull over shirt    Upper body assist Assist Level: Set up assist    Lower Body Dressing/Undressing Lower body dressing      What is the patient wearing?: Pants     Lower body assist Assist for lower body dressing: Contact Guard/Touching assist     Toileting Toileting    Toileting assist Assist for toileting: Minimal Assistance - Patient > 75%     Transfers Chair/bed transfer  Transfers assist     Chair/bed transfer assist level: Minimal Assistance - Patient > 75%     Locomotion Ambulation   Ambulation assist   Ambulation  activity did not occur: Safety/medical concerns  Assist level: Minimal Assistance - Patient > 75% Assistive device: Walker-rolling Max distance: 30'   Walk 10 feet activity   Assist  Walk 10 feet activity did not occur: Safety/medical concerns  Assist level: Minimal Assistance - Patient > 75% Assistive device: Walker-rolling   Walk 50 feet activity   Assist Walk 50 feet with 2 turns activity did not occur: Safety/medical concerns         Walk 150 feet activity   Assist Walk 150  feet activity did not occur: Safety/medical concerns         Walk 10 feet on uneven surface  activity   Assist Walk 10 feet on uneven surfaces activity did not occur: Safety/medical concerns         Wheelchair     Assist Will patient use wheelchair at discharge?: Yes Type of Wheelchair: Manual Wheelchair activity did not occur: Safety/medical concerns  Wheelchair assist level: Supervision/Verbal cueing Max wheelchair distance: 150'    Wheelchair 50 feet with 2 turns activity    Assist    Wheelchair 50 feet with 2 turns activity did not occur: Safety/medical concerns   Assist Level: Supervision/Verbal cueing   Wheelchair 150 feet activity     Assist  Wheelchair 150 feet activity did not occur: Safety/medical concerns   Assist Level: Supervision/Verbal cueing   Blood pressure 118/62, pulse 80, temperature 97.9 F (36.6 C), temperature source Oral, resp. rate 18, height 5\' 6"  (1.676 m), weight 95.4 kg, SpO2 93 %.    Medical Problem List and Plan: 1.Right upper and lower extremity weaknesssecondary to cervical myelopathy.S/Pdiscectomy C5-6, 6-7 decompression placement of anterior instrumentation interbody plate and screws arthrodesis 09/09/2019. Cervical collar when out of bed -patient may shower -ELOS/Goals: 10-14d, supervision with mobility, min assist showering and lower extremity dressing mod I/supervision upper limb ADL 2. Antithrombotics: -DVT/anticoagulation:SCDs. Check vascular study 11/24- Dopplers (-)  11/25- started lovenox- OK'd by [facility].  -antiplatelet therapy: N/A 3. Pain Management:Oxycodone and Robaxin as needed  11/24- will start Gabapentin 300 mg BID for nerve pain and titrate up as required  11/26- helping nerve pain 4. Mood:Provide emotional support -antipsychotic agents: N/A 5. Neuropsych: This patientiscapable of making decisions on herown behalf. 6. Skin/Wound  Care:Routine skin checks 7. Fluids/Electrolytes/Nutrition:Routine in and outs with follow-up chemistries, eating 75-100% meals  8. 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 Flomax 0.4 mg daily. Check PVR  No ICP needed , ? Wean flomax after a couple weeks 9.  Neurogenic bowel.   Improved, no incont  No need for supp   On senna and docusate sodium, last recorded BM 12/4  12/7- said she had LBM in last 24 hours   12/8- BM this AM 10. Hypokalemia  Resolved   11/27- K+ 4.2  12/1- K+ 4.0 11. R ulnar neuropathy  11/29- explained what it was- why she likely had it- to avoid compressing nerve by resting arm on W/C arm rest- will likely get better if doesn't compress nerve.   12/6 intermittent, habitually leans on RIght elbow , + Wartenbergs sign Symptomatically doing better, may need OP EMG     LOS: 15 days A FACE TO FACE EVALUATION WAS PERFORMED  Yanai Hobson 09/28/2019, 9:40 AM

## 2019-09-28 NOTE — Progress Notes (Signed)
Occupational Therapy Session Note  Patient Details  Name: Sandra Parrish MRN: [id number] Date of Birth: [birth year]  Today's Date: 09/28/2019 OT Individual Time: 1330-1430 OT Individual Time Calculation (min): 60 min    Short Term Goals: Week 2:  OT Short Term Goal 1 (Week 2): Pt will consistently perform toilet transfer with RW and mod A. OT Short Term Goal 2 (Week 2): Pt will perform toileting with mod A overall. OT Short Term Goal 3 (Week 2): PT will maintaing standing balance with min A at sink for grooming tasks for 5 minutes.  Skilled Therapeutic Interventions/Progress Updates:    Pt received sitting up in w/c requesting shower. Pt completed sit > stand with min A and RW use. Pt completed slow ambulatory transfer into the bathroom and completed several lateral scoots into the walk in shower to transfer onto TTB. Min-mod A for stabilization provided, as well as min cueing. Pt doffed shirt with (S), pants with min A 2/2 small space in shower restricting movement. Pt completed hair care and UB bathing with set up assist. Pt stood and required min A to wash bottom in standing with unilateral support on anterior grab bar. Pt completed sit > stand with CGA and completed stand pivot transfer out of shower and to w/c with grab bar use, CGA. Pt donned pants with min A, no need for reacher use. Shirt donned with set up assist. Pt's swollen R ankle was propped up in elevation and ice applied. Pt left sitting up with all needs met.   Therapy Documentation Precautions:  Precautions Precautions: Fall, Cervical Precaution Comments: reviewed cervical precautions Cervical Brace: Soft collar Restrictions Weight Bearing Restrictions: No   Therapy/Group: Individual Therapy  [name] 09/28/2019, 1:07 PM

## 2019-09-28 NOTE — Plan of Care (Signed)
Upgraded goals from min A to mod I due to patient progress. Added gait goal and stair goal due to patient progress.

## 2019-09-28 NOTE — Plan of Care (Signed)
  Problem: SCI BOWEL ELIMINATION Goal: RH STG MANAGE BOWEL WITH ASSISTANCE Description: STG Manage Bowel with min Assistance. Outcome: Progressing Goal: RH STG SCI MANAGE BOWEL WITH MEDICATION WITH ASSISTANCE Description: STG SCI Manage bowel with medication with min assistance. Outcome: Progressing Goal: RH STG SCI MANAGE BOWEL PROGRAM W/ASSIST OR AS APPROPRIATE Description: STG SCI Manage bowel program with min assist or as appropriate. Outcome: Progressing   Problem: SCI BLADDER ELIMINATION Goal: RH OTHER STG BLADDER ELIMINATION GOALS W/ASSIST Description: Other STG Bladder Elimination Goals With min Assistance Outcome: Progressing   Problem: RH SAFETY Goal: RH STG ADHERE TO SAFETY PRECAUTIONS W/ASSISTANCE/DEVICE Description: STG Adhere to Safety Precautions With cues and reminders Outcome: Progressing   Problem: RH PAIN MANAGEMENT Goal: RH STG PAIN MANAGED AT OR BELOW PT'S PAIN GOAL Description: Pain level less than 4 on scale of 0-10 Outcome: Progressing   Problem: RH KNOWLEDGE DEFICIT SCI Goal: RH STG INCREASE KNOWLEDGE OF SELF CARE AFTER SCI Description: Pt will be able to demonstrate understanding of fall prevention and proper steps to call for help in case of emergency independently upon discharge. Pt will be able to identify and eliminate environmental hazards that can lead to pt's falling independently.  Outcome: Progressing

## 2019-09-28 NOTE — Patient Care Conference (Signed)
Inpatient RehabilitationTeam Conference and Plan of Care Update Date: 09/28/2019   Time: 11:15 AM   Patient Name: Sandra Parrish      Medical Record Number: [id number]  Date of Birth: 13-Oct-1946 Sex: Female         Room/Bed: 4M12C/4M12C-01 Payor Info: Payor: MEDICARE / Plan: MEDICARE PART A / Product Type: *No Product type* /    Admit Date/Time:  09/13/2019  1:20 PM  Primary Diagnosis:  Cervical myelopathy ([place])  Patient Active Problem List   Diagnosis Date Noted  . Neurogenic bladder 09/14/2019  . Cervical myelopathy ([place]) 09/13/2019  . Cervical spinal stenosis   . Bradycardia   . Acute blood loss anemia   . Urinary retention   . Postoperative pain   . Drug induced constipation   . Stenosis of cervical spine with myelopathy ([place]) 09/07/2019    Expected Discharge Date: Expected Discharge Date: 10/08/19  Team Members Present: Physician leading conference: Dr. Courtney Heys Social Worker Present: Lennart Pall, LCSW Nurse Present: Other (comment)(Susan Truman Hayward, RN) Case Manager: Karene Fry, RN PT Present: Excell Seltzer, PT OT Present: Amy Rounds, OT SLP Present: Jettie Booze, CF-SLP PPS Coordinator present : Gunnar Fusi, SLP     Current Status/Progress Goal Weekly Team Focus  Bowel/Bladder   pt cont B&B, with occasional sense of urgency or anxiety of not making it to bathroom in time, LBM 12/7, scheduled colace and senna BID  pt remain cont and regain consisent bowel pattern  assess q shift and prn   Swallow/Nutrition/ Hydration             ADL's   Min-mod A stand pivot transfers with RW, min A toileting, min A LB dressing sit>stand with RW, set-up UB bathing/dressing and grooming tasks  Upgraded to mod I w/c level overall. Supervision bathing and min A tub/shower transfers  ADL/IADL re-training, functional transfers and mobility, neur re-ed, standing balance/endurance, d/c planning.   Mobility   mod A bed mobility, min A sit to stand to RW, min A SPT with RW, min A gait x  30' with RW, min A 3" stairs; R knee buckles intermittently  min A overall  transfers, gait training, stairs, LE NMR, w/c mobility   Communication             Safety/Cognition/ Behavioral Observations            Pain   0/10 pain for last week, oxy and robaxin prn  pain remain controlled with prn pain medication  assess pain qshift and prn   Skin   surgical incision to anterior neck is approximated, minimal scabbing and OTA without drainage, scattered bruising  skin remains free from infection and new breakdown  assess q shift and prn    Rehab Goals Patient on target to meet rehab goals: Yes *See Care Plan and progress notes for long and short-term goals.     Barriers to Discharge  Current Status/Progress Possible Resolutions Date Resolved   Nursing                  PT  Inaccessible home environment;Decreased caregiver support;Home environment access/layout;Lack of/limited family support  pt lives alone in 2nd floor apt; does not have 24/7 assist upon d/c              OT                  SLP                SW  Discharge Planning/Teaching Needs:  home with intermittent assistance of friends  TBD   Team Discussion: B/B better, ulnar neuropathy, pain controlled, anxiety better.  RN - old trach healed, edema R ankle better.  OT stand pivot min/mod, ADLs min A, goals mod I.  PT min/mod bed, min stand pivot, min a walker, min a stairs, R knee buckling, mod I goals.   Revisions to Treatment Plan: N/A     Medical Summary Current Status: LBM this AM; only skin issues is cervical incision Weekly Focus/Goal: stand pivot transfers min-mod assist; adaptive equipment OT ADLs min assist- think could get to mod I prior to d/c. min-mod assist bed; min assist RW 49ft; 3 inch stairs min assist  Barriers to Discharge: Behavior;Neurogenic Bowel & Bladder;Medical stability;Weight;Other (comments)  Barriers to Discharge Comments: anxiety Possible Resolutions to Barriers: ulnar  flossing; continued PT and OT- upgrading to mod I goals   Continued Need for Acute Rehabilitation Level of Care: The patient requires daily medical management by a physician with specialized training in physical medicine and rehabilitation for the following reasons: Direction of a multidisciplinary physical rehabilitation program to maximize functional independence : Yes Medical management of patient stability for increased activity during participation in an intensive rehabilitation regime.: Yes Analysis of laboratory values and/or radiology reports with any subsequent need for medication adjustment and/or medical intervention. : Yes   I attest that I was present, lead the team conference, and concur with the assessment and plan of the team.   Lelon Frohlich M 09/30/2019, 10:27 AM  Team conference was held via web/ teleconference due to COVID - 19

## 2019-09-29 ENCOUNTER — Inpatient Hospital Stay (HOSPITAL_COMMUNITY): Payer: Self-pay | Admitting: Occupational Therapy

## 2019-09-29 ENCOUNTER — Inpatient Hospital Stay (HOSPITAL_COMMUNITY): Payer: Self-pay | Admitting: Physical Therapy

## 2019-09-29 MED ORDER — HYDROCERIN EX CREA
TOPICAL_CREAM | Freq: Two times a day (BID) | CUTANEOUS | Status: DC
  Administered 2019-09-29 – 2019-09-30 (×4): via TOPICAL
  Administered 2019-10-01 (×2): 1 via TOPICAL
  Administered 2019-10-02: 09:00:00 via TOPICAL
  Administered 2019-10-02 – 2019-10-03 (×2): 1 via TOPICAL
  Administered 2019-10-03 – 2019-10-04 (×3): via TOPICAL
  Filled 2019-09-29: qty 113

## 2019-09-29 NOTE — Progress Notes (Signed)
Physical Therapy Session Note  Patient Details  Name: Sandra Parrish MRN: [id number] Date of Birth: 1946/04/18  Today's Date: 09/29/2019 PT Individual Time: 1300-1410 PT Individual Time Calculation (min): 70 min   Short Term Goals: Week 3:  PT Short Term Goal 1 (Week 3): =LTG due to ELOS  Skilled Therapeutic Interventions/Progress Updates:   Pt received sitting in WC and agreeable to PT. WC mobility with supervision assist x 164f, 1041fand 12032fnd min cues for symmetry in BUE. PT provided WC gloves after 1st bout, and pt reports improved control of wheels Bilaterally.   Gait training with RW x [id number] 46fand CGA for safety. No knee instability noted, but min cues for improved step length on the LLE.    Nustep reciproval movement training level 5 x 10 min, cues for increased steps per minute to enhance endurance and strengthening aspect of movement.   Reciprocal foot taps on 4inch step 2 x 8 BLE with moderate cues for improved isolation of hip flexion on the RLE to place and remove foot from step. Decreased ROM with fatigue into hip flexion.   Side stepping at rail in hall with min assist for safety and min cues for improved technique to target hip adductors on the RLE. Complete 2 x 10 ft Bil.   Patient returned to room and [facility] reach and all needs met.           Therapy Documentation Precautions:  Precautions Precautions: Fall, Cervical Precaution Comments: reviewed cervical precautions Cervical Brace: Soft collar Restrictions Weight Bearing Restrictions: No Pain: denies    Vital Signs: Therapy Vitals Temp: 98.2 F (36.8 C) Pulse Rate: 73 Resp: 16 BP: 121/69 Patient Position (if appropriate): Sitting Oxygen Therapy SpO2: 97 % O2 Device: Room Air    Therapy/Group: Individual Therapy  AustiLorie Phenix/2020, 2:31 PM

## 2019-09-29 NOTE — Progress Notes (Signed)
[facility] PHYSICAL MEDICINE & REHABILITATION PROGRESS NOTE   Subjective/Complaints:  Pt reports incision on neck is really itching her- however otherwise, has no complaints.    ROS- denies CP, SOB, N/V/D/C, Abd pain, HA  Objective:   No results found. Recent Labs    09/27/19 1041  WBC 7.5  HGB 12.4  HCT 37.9  PLT 436*   Recent Labs    09/27/19 1041  NA 140  K 4.0  CL 104  CO2 25  GLUCOSE 98  BUN 11  CREATININE 0.75  CALCIUM 9.0    Intake/Output Summary (Last 24 hours) at 09/29/2019 1850 Last data filed at 09/29/2019 1732 Gross per 24 hour  Intake 600 ml  Output 450 ml  Net 150 ml     Physical Exam: Vital Signs Blood pressure 121/69, pulse 73, temperature 98.2 F (36.8 C), resp. rate 16, height 5\' 6"  (1.676 m), weight 95.4 kg, SpO2 97 %.  Physical Exam Vitals  reviewed General: No acute distress; pleasant, sitting up in manual w/c, in room; nursing in room, NAD Heart: Regular rate and rhythm Lungs: Clear to auscultation B/L Abdomen: (+)BS, soft, NT, ND Extremities: No LE edema Skin:, no dressing over the right anterior lower cervical incision.healing well but scratching/exocriation noticeable Neurologic: Decreased sensation to light touch on lateral aspect of R 4th digit and 5th digit c/w R ulnar neuropathy- not pinched nerve in neck  Right 5th digit in abducted position  Alert and oriented x3, motor strength is 3 - in the right deltoid 3 - in the biceps triceps finger flexors and extensors Left upper extremity is 4 - at the deltoid bicep tricep finger flexors and extensors 4/5 in the hip flexors knee extensors ankle dorsiflexors plantar flexors bilaterally  lower extremities Fine motor is reduced bilateral hands with finger to thumb opposition. Musculoskeletal: good ROM Neck range of motion not tested specifically, but has decreased cervical flexion/rotation/side bending seen on exam    Assessment/Plan: 1. Functional deficits secondary to Cervical  myelopathy s/p C5/6 discectomy, C6-7 decompression and plates and screws arthrodesis  to  which require 3+ hours per day of interdisciplinary therapy in a comprehensive inpatient rehab setting.  Physiatrist is providing close team supervision and 24 hour management of active medical problems listed below.  Physiatrist and rehab team continue to assess barriers to discharge/monitor patient progress toward functional and medical goals  Care Tool:  Bathing    Body parts bathed by patient: Right arm, Left arm, Chest, Abdomen, Right upper leg, Left upper leg, Right lower leg, Left lower leg, Face, Front perineal area   Body parts bathed by helper: Buttocks     Bathing assist Assist Level: Minimal Assistance - Patient > 75%     Upper Body Dressing/Undressing Upper body dressing   What is the patient wearing?: Pull over shirt    Upper body assist Assist Level: Set up assist    Lower Body Dressing/Undressing Lower body dressing      What is the patient wearing?: Pants     Lower body assist Assist for lower body dressing: Contact Guard/Touching assist     Toileting Toileting    Toileting assist Assist for toileting: Minimal Assistance - Patient > 75%     Transfers Chair/bed transfer  Transfers assist     Chair/bed transfer assist level: Minimal Assistance - Patient > 75%     Locomotion Ambulation   Ambulation assist   Ambulation activity did not occur: Safety/medical concerns  Assist level: Minimal Assistance - Patient >  75% Assistive device: Walker-rolling Max distance: 30'   Walk 10 feet activity   Assist  Walk 10 feet activity did not occur: Safety/medical concerns  Assist level: Minimal Assistance - Patient > 75% Assistive device: Walker-rolling   Walk 50 feet activity   Assist Walk 50 feet with 2 turns activity did not occur: Safety/medical concerns         Walk 150 feet activity   Assist Walk 150 feet activity did not occur:  Safety/medical concerns         Walk 10 feet on uneven surface  activity   Assist Walk 10 feet on uneven surfaces activity did not occur: Safety/medical concerns         Wheelchair     Assist Will patient use wheelchair at discharge?: Yes Type of Wheelchair: Manual Wheelchair activity did not occur: Safety/medical concerns  Wheelchair assist level: Supervision/Verbal cueing Max wheelchair distance: 150'    Wheelchair 50 feet with 2 turns activity    Assist    Wheelchair 50 feet with 2 turns activity did not occur: Safety/medical concerns   Assist Level: Supervision/Verbal cueing   Wheelchair 150 feet activity     Assist  Wheelchair 150 feet activity did not occur: Safety/medical concerns   Assist Level: Supervision/Verbal cueing   Blood pressure 121/69, pulse 73, temperature 98.2 F (36.8 C), resp. rate 16, height 5\' 6"  (1.676 m), weight 95.4 kg, SpO2 97 %.    Medical Problem List and Plan: 1.Right upper and lower extremity weaknesssecondary to cervical myelopathy.S/Pdiscectomy C5-6, 6-7 decompression placement of anterior instrumentation interbody plate and screws arthrodesis 09/09/2019. Cervical collar when out of bed -patient may shower -ELOS/Goals: 10-14d, supervision with mobility, min assist showering and lower extremity dressing mod I/supervision upper limb ADL  12/9- will order eucerin BID for anterior neck incision. 2. Antithrombotics: -DVT/anticoagulation:SCDs. Check vascular study 11/24- Dopplers (-)  11/25- started lovenox- OK'd by [facility].  -antiplatelet therapy: N/A 3. Pain Management:Oxycodone and Robaxin as needed  11/24- will start Gabapentin 300 mg BID for nerve pain and titrate up as required  11/26- helping nerve pain 4. Mood:Provide emotional support -antipsychotic agents: N/A 5. Neuropsych: This patientiscapable of making decisions on herown behalf. 6. Skin/Wound  Care:Routine skin checks 7. Fluids/Electrolytes/Nutrition:Routine in and outs with follow-up chemistries, eating 75-100% meals  8. 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 Flomax 0.4 mg daily. Check PVR  No ICP needed , ? Wean flomax after a couple weeks 9.  Neurogenic bowel.   Improved, no incont  No need for supp   On senna and docusate sodium, last recorded BM 12/4  12/7- said she had LBM in last 24 hours   12/8- BM this AM 10. Hypokalemia  Resolved   11/27- K+ 4.2  12/1- K+ 4.0 11. R ulnar neuropathy  11/29- explained what it was- why she likely had it- to avoid compressing nerve by resting arm on W/C arm rest- will likely get better if doesn't compress nerve.   12/6 intermittent, habitually leans on RIght elbow , + Wartenbergs sign Symptomatically doing better, may need OP EMG     LOS: 16 days A FACE TO FACE EVALUATION WAS PERFORMED  Sandra Parrish 09/29/2019, 6:50 PM

## 2019-09-29 NOTE — Plan of Care (Signed)
Problem: RH Balance Goal: LTG: Patient will maintain dynamic sitting balance (OT) Description: LTG:  Patient will maintain dynamic sitting balance with assistance during activities of daily living (OT) Flowsheets (Taken 09/29/2019 0707) LTG: Pt will maintain dynamic sitting balance during ADLs with: (Modified 12/9-AR) Independent with assistive device Note: Goal upgraded due to pt progress. Thijs Brunton, OTR/L Goal: LTG Patient will maintain dynamic standing with ADLs (OT) Description: LTG:  Patient will maintain dynamic standing balance with assist during activities of daily living (OT)  Flowsheets (Taken 09/29/2019 0707) LTG: Pt will maintain dynamic standing balance during ADLs with: Independent with assistive device   Problem: RH Balance Goal: LTG Patient will maintain dynamic standing with ADLs (OT) Description: LTG:  Patient will maintain dynamic standing balance with assist during activities of daily living (OT)  Flowsheets (Taken 09/29/2019 0707) LTG: Pt will maintain dynamic standing balance during ADLs with: Independent with assistive device   Problem: Sit to Stand Goal: LTG:  Patient will perform sit to stand in prep for activites of daily living with assistance level (OT) Description: LTG:  Patient will perform sit to stand in prep for activites of daily living with assistance level (OT) Flowsheets (Taken 09/29/2019 0707) LTG: PT will perform sit to stand in prep for activites of daily living with assistance level: Independent with assistive device   Problem: RH Grooming Goal: LTG Patient will perform grooming w/assist,cues/equip (OT) Description: LTG: Patient will perform grooming with assist, with/without cues using equipment (OT) Flowsheets (Taken 09/29/2019 0707) LTG: Pt will perform grooming with assistance level of: (Modified 12/9-AR) Independent with assistive device  Note: Goal upgraded due to pt progress. Kairi Tufo, OTR/L   Problem: RH Bathing Goal: LTG Patient will  bathe all body parts with assist levels (OT) Description: LTG: Patient will bathe all body parts with assist levels (OT) Flowsheets (Taken 09/29/2019 0707) LTG: Pt will perform bathing with assistance level/cueing: (Modified 12/9-AR) Supervision/Verbal cueing Note: Goal upgraded due to pt progress. Bettye Sitton, OTR/L   Problem: RH Dressing Goal: LTG Patient will perform upper body dressing (OT) Description: LTG Patient will perform upper body dressing with assist, with/without cues (OT). Flowsheets (Taken 09/29/2019 0707) LTG: Pt will perform upper body dressing with assistance level of: (Modified 12/9-AR) Independent Note: Goal upgraded due to pt progress. Mischele Detter, OTR/L Goal: LTG Patient will perform lower body dressing w/assist (OT) Description: LTG: Patient will perform lower body dressing with assist, with/without cues in positioning using equipment (OT) Flowsheets (Taken 09/29/2019 0707) LTG: Pt will perform lower body dressing with assistance level of: (Modified 12/9-AR) Independent with assistive device Note: Goal upgraded due to pt progress. Brandie Lopes, OTR/L   Problem: RH Dressing Goal: LTG Patient will perform lower body dressing w/assist (OT) Description: LTG: Patient will perform lower body dressing with assist, with/without cues in positioning using equipment (OT) Flowsheets (Taken 09/29/2019 0707) LTG: Pt will perform lower body dressing with assistance level of: (Modified 12/9-AR) Independent with assistive device Note: Goal upgraded due to pt progress. Vy Badley, OTR/L   Problem: RH Toileting Goal: LTG Patient will perform toileting task (3/3 steps) with assistance level (OT) Description: LTG: Patient will perform toileting task (3/3 steps) with assistance level (OT)  Flowsheets (Taken 09/29/2019 0707) LTG: Pt will perform toileting task (3/3 steps) with assistance level: (Modified 12/9-AR) Independent with assistive device Note: Goal upgraded due to pt progress. Parmvir Boomer, OTR/L   Problem: RH Toilet Transfers Goal: LTG Patient will perform toilet transfers w/assist (OT) Description: LTG: Patient will perform toilet  transfers with assist, with/without cues using equipment (OT) Flowsheets (Taken 09/29/2019 0707) LTG: Pt will perform toilet transfers with assistance level of: (Modified 12/9-AR) Independent with assistive device Note: Goal upgraded due to pt progress. Aloria Looper, OTR/L

## 2019-09-29 NOTE — Plan of Care (Signed)
  Problem: Consults Goal: RH SPINAL CORD INJURY PATIENT EDUCATION Description:  See Patient Education module for education specifics.  Outcome: Progressing   Problem: SCI BOWEL ELIMINATION Goal: RH STG MANAGE BOWEL WITH ASSISTANCE Description: STG Manage Bowel with mod I Assistance. Outcome: Progressing Goal: RH STG SCI MANAGE BOWEL WITH MEDICATION WITH ASSISTANCE Description: STG SCI Manage bowel with medication with mod I assistance. Outcome: Progressing Goal: RH STG SCI MANAGE BOWEL PROGRAM W/ASSIST OR AS APPROPRIATE Description: STG SCI Manage bowel program with mod I assist or as appropriate. Outcome: Progressing   Problem: SCI BLADDER ELIMINATION Goal: RH OTHER STG BLADDER ELIMINATION GOALS W/ASSIST Description: Other STG Bladder Elimination Goals With mod I Assistance Outcome: Progressing   Problem: RH SAFETY Goal: RH STG ADHERE TO SAFETY PRECAUTIONS W/ASSISTANCE/DEVICE Description: STG Adhere to Safety Precautions With cues and reminders Outcome: Progressing   Problem: RH PAIN MANAGEMENT Goal: RH STG PAIN MANAGED AT OR BELOW PT'S PAIN GOAL Description: Pain level less than 4 on scale of 0-10 Outcome: Progressing   Problem: RH KNOWLEDGE DEFICIT SCI Goal: RH STG INCREASE KNOWLEDGE OF SELF CARE AFTER SCI Description: Pt will be able to demonstrate understanding of fall prevention and proper steps to call for help in case of emergency independently upon discharge. Pt will be able to identify and eliminate environmental hazards that can lead to pt's falling independently.  Outcome: Progressing   

## 2019-09-29 NOTE — Progress Notes (Signed)
Physical Therapy Session Note  Patient Details  Name: Sandra Parrish MRN: [id number] Date of Birth: 1946-08-09  Today's Date: 09/29/2019 PT Individual Time: 0800-0900 PT Individual Time Calculation (min): 60 min   Short Term Goals: Week 3:  PT Short Term Goal 1 (Week 3): =LTG due to ELOS  Skilled Therapeutic Interventions/Progress Updates:    Pt received seated in w/c in room, agreeable to PT session. No complaints of pain. Provided pt with handout for ulnar nerve glide she has performed in other therapy sessions, pt reports good understanding of how to perform exercise based on handout. Sit to stand with min A to RW. Pt is setup A to change pants in standing and sitting with use of reacher. Pt is setup A to change her shirt while seated in w/c. Dependent to don TED hose and shoes for time conservation. Dependent transport via w/c to therapy gym for time conservation. Sit to stand with min A to stairs. Ascend/descend 8 x 3" stairs with 2 handrails and min A, intermittent blocking of RLE as it tends to buckle while descending stairs. Pt is fearful with descent but performs well with improved endurance with stair training this date. Pt becomes tearful after completing stairs due to excitement about being able to navigate them, provided encouragement and emotional support. Ascend/descend two 3" stairs laterally with R handrail and min to mod A with R knee blocked to better simulate home environment as pt only has one handrail at home. Discussed that therapy goals for stair navigation are at min A and pt will require assist with stairs upon d/c, pt states understanding. Reviewed management of w/c parts, pt is able to manage brakes independently and w/c leg rests with min cueing for technique. Manual w/c propulsion x 150 ft with use of BUE at Supervision level. Discussed pt's home setup with thresholds to enter the home as well as pt's concerns about being able to cook and take care of her cats. Will address  in future sessions. Pt left seated in w/c in room with needs in reach at end of session.  Therapy Documentation Precautions:  Precautions Precautions: Fall, Cervical Precaution Comments: reviewed cervical precautions Cervical Brace: Soft collar Restrictions Weight Bearing Restrictions: No    Therapy/Group: Individual Therapy   Excell Seltzer, PT, DPT  09/29/2019, 12:24 PM

## 2019-09-29 NOTE — Progress Notes (Addendum)
Occupational Therapy Weekly Progress Note  Patient Details  Name: Sandra Parrish MRN: [id number] Date of Birth: October 05, 1946  Beginning of progress report period: September 22, 2019 End of progress report period: September 29, 2019  Today's Date: 09/29/2019 OT Individual Time: 1000-1100 OT Individual Time Calculation (min): 60 min    Patient has met 3 of 3 short term goals.  Pt is making excellent progress towards OT goals. She is completed functional transfers and short distance functional ambulation at overall min-occasional mod A when fatigue while using RW. Using AE for bathing/dressing routine and completing LB dressing at [place] and UB bathing/dressing with set-up.  Pt is very motivated to return home, however, has only intermittent supervision assist at d/c. Recommending mostly w/c level at d/c for safety, however, can complete stand pivot transfers with RW and sit>stand at [facility] for LB clothing management/toileting task. Education on-going with pt regarding recommendations for modified ADLs and mobility at d/c in order to increase safety and independence at d/c. Pt in agreement with recommendations.  Pt provided with multi-modal education for ulnar flossing exercise. Pt independent in performing outside of tx session.  Patient continues to demonstrate the following deficits: muscle weakness and muscle paralysis, decreased cardiorespiratoy endurance and decreased sitting balance, decreased standing balance, decreased postural control and decreased balance strategies and therefore will continue to benefit from skilled OT intervention to enhance overall performance with BADL, iADL and Reduce care partner burden.  Patient progressing toward long term goals..  Plan of care revisions: Goals upgraded to supervision-mod I overall 2/2 pt progress. See POC for goal details. . W/C level IADL goals added in prep for d/c home at mod I level  OT Short Term Goals Week 2:  OT Short Term Goal 1 (Week  2): Pt will consistently perform toilet transfer with RW and mod A. OT Short Term Goal 1 - Progress (Week 2): Met OT Short Term Goal 2 (Week 2): Pt will perform toileting with mod A overall. OT Short Term Goal 2 - Progress (Week 2): Met OT Short Term Goal 3 (Week 2): PT will maintaing standing balance with min A at sink for grooming tasks for 5 minutes. OT Short Term Goal 3 - Progress (Week 2): Met Week 3:  OT Short Term Goal 1 (Week 3): STG=LTG due to LOS  Skilled Therapeutic Interventions/Progress Updates:    Pt seen for OT session focusing on IADL re-training and functional mobility within the home. Pt sitting up in w/c upon arrival, denying pain and agreeable to tx session, denied need for bathing/dressing routine this session.  She self-propelled w/c throughout unit with supervision and increased time. In ADL apartment, extensive education/discussion regarding kitchen accessibility and problem solving from a mostly w/c level. Recommendations for placing commonly used items on counter top level and time and energy conservation methods to use during cooking/meal prep tasks, AE, etc.. She completed sit>stand at counter to reach into overhead cabinet to obtain item, completed with supervision, VCs required for proper set-up and to remember to lock w/c brakes. From w/c level, used microwave and transported food to table in simulation of home set-up.  Pt returned to room at end of session, left seated in w/c with all needs in reach. Pt voiced appreciate for session and reports having learned a lot, though remains apprehensive for IADLs in prep for d/c home at mainly mod I level. Will cont to address during rehab admission.   Therapy Documentation Precautions:  Precautions Precautions: Fall, Cervical Precaution Comments: reviewed cervical precautions  Cervical Brace: Soft collar Restrictions Weight Bearing Restrictions: No Pain:     Therapy/Group: Individual Therapy  Aahan Marques  L 09/29/2019, 6:57 AM

## 2019-09-29 NOTE — Evaluation (Signed)
Recreational Therapy Assessment and Plan  Patient Details  Name: Sandra Parrish MRN: [id number] Date of Birth: Jul 11, 1946 Today's Date: 09/29/2019  Rehab Potential: Good ELOS: discharge 12/18   Assessment  Problem List:      Patient Active Problem List   Diagnosis Date Noted  . Neurogenic bladder 09/14/2019  . Cervical myelopathy ([place]) 09/13/2019  . Cervical spinal stenosis   . Bradycardia   . Acute blood loss anemia   . Urinary retention   . Postoperative pain   . Drug induced constipation   . Stenosis of cervical spine with myelopathy ([place]) 09/07/2019    Past Medical History: History reviewed. No pertinent past medical history. Past Surgical History:       Past Surgical History:  Procedure Laterality Date  . ANTERIOR CERVICAL DECOMP/DISCECTOMY FUSION N/A 09/09/2019   Procedure: ANTERIOR CERVICAL DECOMPRESSION/DISCECTOMY FUSION CERVICAL FIVE- CERVICAL SIX, CERVICAL SIX- CERVICAL SEVEN;  Surgeon: Consuella Lose, MD;  Location: [facility];  Service: Neurosurgery;  Laterality: N/A;  ANTERIOR CERVICAL DECOMPRESSION/DISCECTOMY FUSION CERVICAL FIVE- CERVICAL SIX, CERVICAL SIX- CERVICAL SEVEN    Assessment & Plan Clinical Impression: Patient is a 73 y.o. year old female with unremarkable past medical history. Per chart review and patient, patient lives alone. She was independent prior to admission. She does not have any family in the area but does have a neighbor and some friends. 1 level home 6 steps to entry. Presented 09/07/2019 after a fall without loss of consciousness and progressive right upper extremity and right lower extremity weakness and tingling. Admission chemistries unremarkable, SARS coronavirus negative, hemoglobin 11.6. Cranial CT scan unremarkable for acute intracranial process. There was a midline frontal scalp swelling contusion with small hematoma measuring 4 mm in maximal thickness. CT/MRI cervical spine showed abnormal cord edema versus  myelomalacia extending from C4-5 through the mid C7 level associated with severe central narrowing of the thecal sac at C5-6 and moderate central narrowing C6-7 with myelopathy. Underwent discectomy at C5-6, 6-7 for decompression of spinal cord and existing nerve root placement of anterior instrumentation consisting of interbody plate and screws arthrodesis on 09/09/2019 per Dr. Kathyrn Sheriff. Hospital course complicated by pain as well as bouts of urinary retention with initially Foley catheter tube placed maintained on Flomax. Cervical collar when out of bed. Tolerating a regular diet.  .  Patient transferred to CIR on 09/13/2019 .    Pt presents with decreased activity tolerance, decreased functional mobility, decreased balance, feelings of anxiety Limiting pt's independence with leisure/community pursuits.   Leisure History/Participation Premorbid leisure interest/current participation: [employer] store;Community - Travel (Comment);Crafts - Other (Comment) Other Leisure Interests: Reading;Cooking/Baking Leisure Participation Style: Alone;With Family/Friends Awareness of Community Resources: Good-identify 3 post discharge leisure resources Psychosocial / Spiritual Social interaction - Mood/Behavior: Cooperative Academic librarian Appropriate for Education?: Yes Strengths/Weaknesses Patient Strengths/Abilities: Willingness to participate;Active premorbidly Patient weaknesses: Physical limitations TR Patient demonstrates impairments in the following area(s): Endurance;Motor;Pain  Plan Rec Therapy Plan Is patient appropriate for Therapeutic Recreation?: Yes Rehab Potential: Good Treatment times per week: Min 1 TR session >20 minutes during LOS Estimated Length of Stay: discharge 12/18 TR Treatment/Interventions: Adaptive equipment instruction;Community reintegration;Patient/family education;1:1 session;Functional mobility training;UE/LE Coordination  activities;Balance/vestibular training;Recreation/leisure participation;Leisure education;Therapeutic activities;Other (Comment)(relaxation training) Recommendations for other services: Neuropsych  Recommendations for other services: Neuropsych  Discharge Criteria: Patient will be discharged from TR if patient refuses treatment 3 consecutive times without medical reason.  If treatment goals not met, if there is a change in medical status, if patient makes no progress  towards goals or if patient is discharged from hospital.  The above assessment, treatment plan, treatment alternatives and goals were discussed and mutually agreed upon: by patient  [place] 09/29/2019, 3:53 PM

## 2019-09-30 ENCOUNTER — Inpatient Hospital Stay (HOSPITAL_COMMUNITY): Payer: Self-pay | Admitting: Physical Therapy

## 2019-09-30 ENCOUNTER — Inpatient Hospital Stay (HOSPITAL_COMMUNITY): Payer: Self-pay | Admitting: Occupational Therapy

## 2019-09-30 NOTE — Progress Notes (Signed)
POccupational Therapy Session Note  Patient Details  Name: Sandra Parrish MRN: [id number] Date of Birth: 10/28/1945  Today's Date: 09/30/2019 OT Individual Time: 0945-1100 OT Individual Time Calculation (min): 75 min    Short Term Goals: Week 3:  OT Short Term Goal 1 (Week 3): STG=LTG due to LOS  Skilled Therapeutic Interventions/Progress Updates:    Pt seen for OT session focusing on IADL re-training and education during simple meal prep task. Pt sitting up in w/c upon arrival, voiced fatigue from previous tx session but willing to participate as able.  She self propelled w/c throughout unit and in ADL apartment with supervision. VCs provided throughout for effective w/c management techniques in functional context and during IADL task.  Simple meal prep task completed from mostly w/c level, exception to stand to retrieve items from overhead cabinet and standing at RW to retrieve items from freezer. Extensive education provided regarding recommendations for w/c level cooking tasks and how to complete meal prep activities from w/c level.  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 Completed at w/c level at standard table, education for stabilization at elbow and shoulder for distal control. Pt very excited to see she was capable to completing this meaningful task.  Provided education regarding AE to be using in kitchen including adaptive cutting board, etc. Pt returned to room at end of session, all needs in reach.   Therapy Documentation Precautions:  Precautions Precautions: Fall, Cervical Precaution Comments: reviewed cervical precautions Cervical Brace: Soft collar Restrictions Weight Bearing Restrictions: No Pain: Pain Assessment Pain Scale: 0-10 Pain Score: 0-No pain   Therapy/Group: Individual Therapy  Jacinta Penalver L 09/30/2019, 6:56 AM

## 2019-09-30 NOTE — Progress Notes (Signed)
Social Work Patient ID: Sandra Parrish, female   DOB: 1946/04/13, 73 y.o.   MRN: [id number]   Met with pt on Tuesday following team conference.  She is pleased with progress. Aware and agreeable with targeted d/c date of 12/18 and mod independent goals overall.  She admits she is "a little nervous" but believes she can reach goals.  Mood much brighter.  Turner Kunzman, LCSW

## 2019-09-30 NOTE — Progress Notes (Signed)
[facility] PHYSICAL MEDICINE & REHABILITATION PROGRESS NOTE   Subjective/Complaints:  Pt reports incision on neck much better with Eucerin. Per nursing notes, needs a lot of encouragement to do things for herself, which is concerning because she's going home alone.     ROS- denies CP, SOB, N/V/D/C, Abd pain, HA  Objective:   No results found. Recent Labs    09/27/19 1041  WBC 7.5  HGB 12.4  HCT 37.9  PLT 436*   Recent Labs    09/27/19 1041  NA 140  K 4.0  CL 104  CO2 25  GLUCOSE 98  BUN 11  CREATININE 0.75  CALCIUM 9.0    Intake/Output Summary (Last 24 hours) at 09/30/2019 1021 Last data filed at 09/30/2019 0730 Gross per 24 hour  Intake 360 ml  Output 450 ml  Net -90 ml     Physical Exam: Vital Signs Blood pressure 133/67, pulse 76, temperature 97.8 F (36.6 C), temperature source Oral, resp. rate 18, height 5\' 6"  (1.676 m), weight 95.4 kg, SpO2 96 %.  Physical Exam Vitals  reviewed General: No acute distress; pleasant, sitting up in manual w/c, in room; OT in room working with Pt, NAD Heart: Regular rate and rhythm Lungs: Clear to auscultation B/L Abdomen: (+)BS, soft, NT, ND Extremities: No LE edema Skin:, no dressing over the right anterior lower cervical incision.healing well but scratching/exocriation less Neurologic: Decreased sensation to light touch on lateral aspect of R 4th digit and 5th digit c/w R ulnar neuropathy- not pinched nerve in neck  Right 5th digit in abducted position  Alert and oriented x3, motor strength is 3 - in the right deltoid 3 - in the biceps triceps finger flexors and extensors Left upper extremity is 4 - at the deltoid bicep tricep finger flexors and extensors 4/5 in the hip flexors knee extensors ankle dorsiflexors plantar flexors bilaterally  lower extremities Fine motor is reduced bilateral hands with finger to thumb opposition. Musculoskeletal: good ROM Neck range of motion not tested specifically, but has  decreased cervical flexion/rotation/side bending seen on exam    Assessment/Plan: 1. Functional deficits secondary to Cervical myelopathy s/p C5/6 discectomy, C6-7 decompression and plates and screws arthrodesis  to  which require 3+ hours per day of interdisciplinary therapy in a comprehensive inpatient rehab setting.  Physiatrist is providing close team supervision and 24 hour management of active medical problems listed below.  Physiatrist and rehab team continue to assess barriers to discharge/monitor patient progress toward functional and medical goals  Care Tool:  Bathing    Body parts bathed by patient: Right arm, Left arm, Chest, Abdomen, Right upper leg, Left upper leg, Right lower leg, Left lower leg, Face, Front perineal area   Body parts bathed by helper: Buttocks     Bathing assist Assist Level: Minimal Assistance - Patient > 75%     Upper Body Dressing/Undressing Upper body dressing   What is the patient wearing?: Pull over shirt    Upper body assist Assist Level: Set up assist    Lower Body Dressing/Undressing Lower body dressing      What is the patient wearing?: Pants     Lower body assist Assist for lower body dressing: Contact Guard/Touching assist     Toileting Toileting    Toileting assist Assist for toileting: Minimal Assistance - Patient > 75%     Transfers Chair/bed transfer  Transfers assist     Chair/bed transfer assist level: Minimal Assistance - Patient > 75%  Locomotion Ambulation   Ambulation assist   Ambulation activity did not occur: Safety/medical concerns  Assist level: Minimal Assistance - Patient > 75% Assistive device: Walker-rolling Max distance: 30'   Walk 10 feet activity   Assist  Walk 10 feet activity did not occur: Safety/medical concerns  Assist level: Minimal Assistance - Patient > 75% Assistive device: Walker-rolling   Walk 50 feet activity   Assist Walk 50 feet with 2 turns activity did  not occur: Safety/medical concerns         Walk 150 feet activity   Assist Walk 150 feet activity did not occur: Safety/medical concerns         Walk 10 feet on uneven surface  activity   Assist Walk 10 feet on uneven surfaces activity did not occur: Safety/medical concerns         Wheelchair     Assist Will patient use wheelchair at discharge?: Yes Type of Wheelchair: Manual Wheelchair activity did not occur: Safety/medical concerns  Wheelchair assist level: Supervision/Verbal cueing Max wheelchair distance: 150'    Wheelchair 50 feet with 2 turns activity    Assist    Wheelchair 50 feet with 2 turns activity did not occur: Safety/medical concerns   Assist Level: Supervision/Verbal cueing   Wheelchair 150 feet activity     Assist  Wheelchair 150 feet activity did not occur: Safety/medical concerns   Assist Level: Supervision/Verbal cueing   Blood pressure 133/67, pulse 76, temperature 97.8 F (36.6 C), temperature source Oral, resp. rate 18, height 5\' 6"  (1.676 m), weight 95.4 kg, SpO2 96 %.    Medical Problem List and Plan: 1.Right upper and lower extremity weaknesssecondary to cervical myelopathy.S/Pdiscectomy C5-6, 6-7 decompression placement of anterior instrumentation interbody plate and screws arthrodesis 09/09/2019. Cervical collar when out of bed -patient may shower -ELOS/Goals: 10-14d, supervision with mobility, min assist showering and lower extremity dressing mod I/supervision upper limb ADL  12/9- will order eucerin BID for anterior neck incision.  12/10- needs encouragement to participate in care 2. Antithrombotics: -DVT/anticoagulation:SCDs. Check vascular study 11/24- Dopplers (-)  11/25- started lovenox- OK'd by [facility].  -antiplatelet therapy: N/A 3. Pain Management:Oxycodone and Robaxin as needed  11/24- will start Gabapentin 300 mg BID for nerve pain and titrate up as  required  11/26- helping nerve pain 4. Mood:Provide emotional support -antipsychotic agents: N/A 5. Neuropsych: This patientiscapable of making decisions on herown behalf. 6. Skin/Wound Care:Routine skin checks 7. Fluids/Electrolytes/Nutrition:Routine in and outs with follow-up chemistries, eating 75-100% meals  8. 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 Flomax 0.4 mg daily. Check PVR  No ICP needed , ? Wean flomax after a couple weeks 9.  Neurogenic bowel.   Improved, no incont  No need for supp   On senna and docusate sodium, last recorded BM 12/4  12/7- said she had LBM in last 24 hours   12/8- BM this AM 10. Hypokalemia  Resolved   11/27- K+ 4.2  12/1- K+ 4.0 11. R ulnar neuropathy  11/29- explained what it was- why she likely had it- to avoid compressing nerve by resting arm on W/C arm rest- will likely get better if doesn't compress nerve.   12/6 intermittent, habitually leans on RIght elbow , + Wartenbergs sign Symptomatically doing better, may need OP EMG     LOS: 17 days A FACE TO FACE EVALUATION WAS PERFORMED  Phinehas Grounds 09/30/2019, 10:21 AM

## 2019-09-30 NOTE — Progress Notes (Signed)
Physical Therapy Session Note  Patient Details  Name: Sandra Parrish MRN: [id number] Date of Birth: 26-Jun-1946  Today's Date: 09/30/2019 PT Individual Time: 0800-0900; 1400-1500 PT Individual Time Calculation (min): 60 min and 60 min  Short Term Goals: Week 3:  PT Short Term Goal 1 (Week 3): =LTG due to ELOS  Skilled Therapeutic Interventions/Progress Updates:    Session 1: Pt received seated in w/c in room, agreeable to PT session. No complaints of pain at rest. Does have onset of mid-thoracic band pain that pt has been experiencing with mobility as well as onset of R hip soreness and L low back soreness with bed mobility. Sit to stand with CGA to min A to RW throughout session. Car transfer with mod A overall with use of RW, min A to get RLE into car, mod A to stand from low seat height of simulation sedan. Bed mobility with mod A for BLE management with use of bedrail on flat bed supine to/from sit. Introduced use of leg lifter for bed mobility. Pt is min A for sit to supine with use of leg lifter with RLE. Pt is mod A for supine to sit even with leg lifter for significant assist with trunk. Attempt multiple supine to/from sits on both sides of the bed with use of leg lifter, pt consistently mod A for remainder of session. Pt becomes more anxious with each trial due to fear of being able to get in and out of bed independently at home. Will continue to work on bed mobility with patient and consider options for necessary adaptive equipment. Stand pivot transfer bed to w/c with min A and RW. Pt left seated in w/c in room with needs in reach at end of session.  Session 2: Pt received seated in w/c in room, becomes tearful upon therapist entering the room. Pt reports she was able to sign her name with pen and paper for the first time this date and was incredibly excited. Provided encouragement and emotional support to patient. Manual w/c propulsion x 150 ft with use of BUE at Supervision level,  increased time needed. Pt reports her BP was low earlier when vitals checked by NT, current seated BP 113/62 and pt asymptomatic. Sit to stand with min A to stair rails. Ascend/descend 4 x 3" stairs with R handrail and min A, occasional blocking of R knee. Attempt to ascend stairs with L handrail ascending with RLE first, pt unable to perform stairs safely with RLE first due to R knee buckling. Ascend/descend 3 x 3" stairs with L handrail ascending with LLE first with min A for balance. Pt to discuss having a 2nd handrail installed on her stairs as her with her landlord for improved safety with stair navigation. Ambulation x 40', x 30' with RW and min A for balance. Pt progresses from step-to gait pattern with decreased stance time on LLE to step-through gait pattern with more normalized cadence. Pt does have onset of R knee instability towards end of second bout of gait. Pt exhibits good awareness of when her body is fatigued and when she needs to sit and take a break. Pt left seated in w/c in room with needs in reach at end of session.  Therapy Documentation Precautions:  Precautions Precautions: Fall, Cervical Precaution Comments: reviewed cervical precautions Cervical Brace: Soft collar Restrictions Weight Bearing Restrictions: No    Therapy/Group: Individual Therapy   Excell Seltzer, PT, DPT  09/30/2019, 12:49 PM

## 2019-09-30 NOTE — Progress Notes (Signed)
Slept well throughout night without issue, up in w/c this am eating breakfast. Continues to need encouragement to do skills independently, often tell the staff "I like it when you do it"

## 2019-10-01 ENCOUNTER — Ambulatory Visit (HOSPITAL_COMMUNITY): Payer: Self-pay | Admitting: *Deleted

## 2019-10-01 ENCOUNTER — Inpatient Hospital Stay (HOSPITAL_COMMUNITY): Payer: Self-pay | Admitting: Occupational Therapy

## 2019-10-01 ENCOUNTER — Inpatient Hospital Stay (HOSPITAL_COMMUNITY): Payer: Self-pay | Admitting: Physical Therapy

## 2019-10-01 MED ORDER — DULOXETINE HCL 30 MG PO CPEP
30.0000 mg | ORAL_CAPSULE | Freq: Every day | ORAL | Status: AC
  Administered 2019-10-01 – 2019-10-07 (×7): 30 mg via ORAL
  Filled 2019-10-01 (×7): qty 1

## 2019-10-01 MED ORDER — GABAPENTIN 300 MG PO CAPS
300.0000 mg | ORAL_CAPSULE | Freq: Three times a day (TID) | ORAL | Status: DC
  Administered 2019-10-01 – 2019-10-08 (×21): 300 mg via ORAL
  Filled 2019-10-01 (×20): qty 1

## 2019-10-01 NOTE — Progress Notes (Signed)
Recreational Therapy Session Note  Patient Details  Name: Crystalmarie Yasin MRN: [id number] Date of Birth: April 29, 1946 Today's Date: 10/01/2019 Time:  83-905 Pain: no c/o Skilled Therapeutic Interventions/Progress Updates: Session focused on pet care tasks- problem solving through how to manage the litter box for her cats.  Recommended elevating litter box to a safe & more manageable height and to complete task seated vs standing for energy conservation and safety.  Pt will enlisted help of neighbors/friends to assist with litter disposal.  Also discussed/problem solved how she would transport her RW throughout her home for use during standing activities.  Educated pt on how to fold/unfold her RW and transport it and had pt demonstrate this.   Reviewed seated vs standing tasks for energy conservation and safety.  Therapy/Group: Co-Treatment   Kamin Niblack 10/01/2019, 10:50 AM

## 2019-10-01 NOTE — Progress Notes (Signed)
Physical Therapy Session Note  Patient Details  Name: Sandra Parrish MRN: [id number] Date of Birth: 1946-06-25  Today's Date: 10/01/2019 PT Individual Time: 0830-0930; 2426-8341 PT Individual Time Calculation (min): 60 min and 75 min  Short Term Goals: Week 3:  PT Short Term Goal 1 (Week 3): =LTG due to ELOS  Skilled Therapeutic Interventions/Progress Updates:    Session 1: Pt received seated in w/c in room, agreeable to PT session. No complaints of pain. Dependent transport via w/c to therapy gym for time conservation. Discussed pt's home setup and how to manage feeding her cats and changing their litter from a w/c level. Discussed placing feeding dishes and cat litter box at chest height while seated in w/c for ease of management as well as increased safety. Discussed how to manage RW and transport with her throughout her apartment. Pt is able to fold up RW with cueing and place on her lap. Pt is able to propel w/c x 50 ft with RW on her lap. Pt does well with managing RW but also discussed having multiple RW set up throughout her apartment. Discussed home setup and types of chairs that pt has in her apartment. Per pt she has several chairs without arms. Sit to stand from low chair with no armrests, max A to RW due to low seat height and no UE support on chair. Discussed chairs that are easier to sit to stand from that pt has at her apartment that would be more practical to use. Pt left seated in w/c in room with needs in reach at end of session. Cotreatment session with RT.  Session 2: Pt received seated in w/c in room, agreeable to PT session. No complaints of pain. Sit to stand with min A to RW. Ambulation into bathroom with RW and min A. Toilet transfer from standard toilet seat height with RW and use of L grab bar to simulate pt being able to use L countertop with mod A to stand from low toilet seat height. Pt's OT to provide handouts for purchasing adaptive equipment for use in bathroom for  increased independence. Bed mobility on real bed in apartment to simulate patient's home environment. Trialed transfers on L side of bed and R side of bed. Pt remains at mod A for sit to supine on both sides of the bed for BLE management even with use of leg lifter. Pt is min A to go from supine to sitting EOB on R side of bed with min A required for trunk control. Again discussed use of a hospital bed at home, pt not very receptive to idea of hospital bed. Will continue to work towards pt becoming more independent with bed mobility. Provided handout for where to purchase bedrail as pt is currently dependent on use of bedrail for bed mobility. Manual w/c propulsion x 150 ft with use of BUE at Supervision level. Pt left seated in w/c in room with needs in reach at end of session.   Therapy Documentation Precautions:  Precautions Precautions: Fall, Cervical Precaution Comments: reviewed cervical precautions Cervical Brace: Soft collar Restrictions Weight Bearing Restrictions: No    Therapy/Group: Individual Therapy   Excell Seltzer, PT, DPT  10/01/2019, 12:56 PM

## 2019-10-01 NOTE — Progress Notes (Signed)
Occupational Therapy Session Note  Patient Details  Name: Sandra Parrish MRN: [id number] Date of Birth: 1946-09-08  Today's Date: 10/01/2019 OT Individual Time: 1000-1100 OT Individual Time Calculation (min): 60 min    Short Term Goals: Week 3:  OT Short Term Goal 1 (Week 3): STG=LTG due to LOS  Skilled Therapeutic Interventions/Progress Updates:    Pt seen for OT ADL bathing/dressing session. Pt sitting up in w/c upon arrival, denying pain and agreeable to tx session.  ADL Re-training: Doffed velcro shoes from w/c level using reacher to assist with managing fasteners. STeadying assit provided while pt managed LB clothing.  She bathed seated on TTB with supervision overall, using LH sponge and cut out feature of TTB. She dressed seated in w/c, LB dressing completed with guarding assist when standing to pull pants up with RW. UB dressed with set-up.  Completed grooming tasks from w/c level at sink, demonstrates improved UE strength/endurance in order to manage hair dryer overhead for 2-3 minutes.   Transfers: Sit>stand with CGA using RW with VCs for hand placement. Ambulated into bathroom with min A using RW, shower stall transfer with min A using grab bars and VCs for technique. Ambulated out of shower with min A.  Stand pivot transfer to recliner with min A   Pt left seated in recliner with all needs in reach.  Therapy Documentation Precautions:  Precautions Precautions: Fall, Cervical Precaution Comments: reviewed cervical precautions Cervical Brace: Soft collar Restrictions Weight Bearing Restrictions: No Pain: Pain Assessment Pain Scale: 0-10 Pain Score: 0-No pain   Therapy/Group: Individual Therapy  Kailynne Ferrington L 10/01/2019, 6:58 AM

## 2019-10-01 NOTE — Progress Notes (Signed)
[place] PHYSICAL MEDICINE & REHABILITATION PROGRESS NOTE   Subjective/Complaints:  Pt reports can now write her name with some good success- when close to done with voiding, feels very "joyful" denies stinging and burning- explained that's the feeling of sensation coming back.   Band of at level SCI pain is getting more painful- asking to go up on meds.    ROS- denies CP, SOB, N/V/D/C, Abd pain, HA  Objective:   No results found. No results for input(s): WBC, HGB, HCT, PLT in the last 72 hours. No results for input(s): NA, K, CL, CO2, GLUCOSE, BUN, CREATININE, CALCIUM in the last 72 hours.  Intake/Output Summary (Last 24 hours) at 10/01/2019 1756 Last data filed at 10/01/2019 1230 Gross per 24 hour  Intake 700 ml  Output --  Net 700 ml     Physical Exam: Vital Signs Blood pressure 129/64, pulse 79, temperature 97.8 F (36.6 C), resp. rate 20, height 5\' 6"  (1.676 m), weight 95.4 kg, SpO2 95 %.  Physical Exam Vitals  reviewed General: No acute distress; pleasant, sitting up in manual w/c, in room; OT in room working with nursing- shown good handwriting her name, NAD Heart: Regular rate and rhythm Lungs: Clear to auscultation B/L Abdomen: (+)BS, soft, NT, ND Extremities: No LE edema Skin:, no dressing over the right anterior lower cervical incision.healing well but scratching/exocriation less Neurologic: Decreased sensation to light touch on lateral aspect of R 4th digit and 5th digit c/w R ulnar neuropathy- not pinched nerve in neck  Right 5th digit in abducted position  Alert and oriented x3, motor strength is 3 - in the right deltoid 3 - in the biceps triceps finger flexors and extensors Left upper extremity is 4 - at the deltoid bicep tricep finger flexors and extensors 4/5 in the hip flexors knee extensors ankle dorsiflexors plantar flexors bilaterally  lower extremities Fine motor is reduced bilateral hands with finger to thumb opposition. Musculoskeletal: good  ROM Neck range of motion not tested specifically, but has decreased cervical flexion/rotation/side bending seen on exam    Assessment/Plan: 1. Functional deficits secondary to Cervical myelopathy s/p C5/6 discectomy, C6-7 decompression and plates and screws arthrodesis  to  which require 3+ hours per day of interdisciplinary therapy in a comprehensive inpatient rehab setting.  Physiatrist is providing close team supervision and 24 hour management of active medical problems listed below.  Physiatrist and rehab team continue to assess barriers to discharge/monitor patient progress toward functional and medical goals  Care Tool:  Bathing    Body parts bathed by patient: Right arm, Left arm, Chest, Abdomen, Right upper leg, Left upper leg, Right lower leg, Left lower leg, Face, Front perineal area, Buttocks   Body parts bathed by helper: Buttocks     Bathing assist Assist Level: Supervision/Verbal cueing     Upper Body Dressing/Undressing Upper body dressing   What is the patient wearing?: Pull over shirt    Upper body assist Assist Level: Independent    Lower Body Dressing/Undressing Lower body dressing      What is the patient wearing?: Pants     Lower body assist Assist for lower body dressing: Contact Guard/Touching assist     Toileting Toileting    Toileting assist Assist for toileting: Minimal Assistance - Patient > 75%     Transfers Chair/bed transfer  Transfers assist     Chair/bed transfer assist level: Contact Guard/Touching assist     Locomotion Ambulation   Ambulation assist   Ambulation activity did  not occur: Safety/medical concerns  Assist level: Minimal Assistance - Patient > 75% Assistive device: Walker-rolling Max distance: 40'   Walk 10 feet activity   Assist  Walk 10 feet activity did not occur: Safety/medical concerns  Assist level: Minimal Assistance - Patient > 75% Assistive device: Walker-rolling   Walk 50 feet  activity   Assist Walk 50 feet with 2 turns activity did not occur: Safety/medical concerns         Walk 150 feet activity   Assist Walk 150 feet activity did not occur: Safety/medical concerns         Walk 10 feet on uneven surface  activity   Assist Walk 10 feet on uneven surfaces activity did not occur: Safety/medical concerns         Wheelchair     Assist Will patient use wheelchair at discharge?: Yes Type of Wheelchair: Manual Wheelchair activity did not occur: Safety/medical concerns  Wheelchair assist level: Supervision/Verbal cueing Max wheelchair distance: 150'    Wheelchair 50 feet with 2 turns activity    Assist    Wheelchair 50 feet with 2 turns activity did not occur: Safety/medical concerns   Assist Level: Supervision/Verbal cueing   Wheelchair 150 feet activity     Assist  Wheelchair 150 feet activity did not occur: Safety/medical concerns   Assist Level: Supervision/Verbal cueing   Blood pressure 129/64, pulse 79, temperature 97.8 F (36.6 C), resp. rate 20, height 5\' 6"  (1.676 m), weight 95.4 kg, SpO2 95 %.    Medical Problem List and Plan: 1.Right upper and lower extremity weaknesssecondary to cervical myelopathy.S/Pdiscectomy C5-6, 6-7 decompression placement of anterior instrumentation interbody plate and screws arthrodesis 09/09/2019. Cervical collar when out of bed -patient may shower -ELOS/Goals: 10-14d, supervision with mobility, min assist showering and lower extremity dressing mod I/supervision upper limb ADL  12/9- will order eucerin BID for anterior neck incision.  12/10- needs encouragement to participate in care 2. Antithrombotics: -DVT/anticoagulation:SCDs. Check vascular study 11/24- Dopplers (-)  11/25- started lovenox- OK'd by [facility].  -antiplatelet therapy: N/A 3. Pain Management:Oxycodone and Robaxin as needed  11/24- will start Gabapentin 300 mg BID for nerve  pain and titrate up as required  11/26- helping nerve pain  12/11- will increase gabapentin to 300 mg TID and add Duloxetine 30 mg daily for nerve pain- if tolerates, will increase Duloxetine.  4. Mood:Provide emotional support -antipsychotic agents: N/A 5. Neuropsych: This patientiscapable of making decisions on herown behalf. 6. Skin/Wound Care:Routine skin checks 7. Fluids/Electrolytes/Nutrition:Routine in and outs with follow-up chemistries, eating 75-100% meals  8. 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 Flomax 0.4 mg daily. Check PVR  No ICP needed , ? Wean flomax after a couple weeks 9.  Neurogenic bowel.   Improved, no incont  No need for supp   On senna and docusate sodium, last recorded BM 12/4  12/7- said she had LBM in last 24 hours   12/8- BM this AM 10. Hypokalemia  Resolved   11/27- K+ 4.2  12/1- K+ 4.0 11. R ulnar neuropathy  11/29- explained what it was- why she likely had it- to avoid compressing nerve by resting arm on W/C arm rest- will likely get better if doesn't compress nerve.   12/6 intermittent, habitually leans on RIght elbow , + Wartenbergs sign Symptomatically doing better, may need OP EMG     LOS: 18 days A FACE TO FACE EVALUATION WAS PERFORMED  Sandra Parrish 10/01/2019, 5:56 PM

## 2019-10-01 NOTE — Progress Notes (Signed)
Pt slept well overnight without issue 

## 2019-10-02 NOTE — Progress Notes (Signed)
[place] PHYSICAL MEDICINE & REHABILITATION PROGRESS NOTE   Subjective/Complaints:  Pt without complaints. Feels that she's improving. Pain controlled  ROS: Patient denies fever, rash, sore throat, blurred vision, nausea, vomiting, diarrhea, cough, shortness of breath or chest pain, joint or back pain, headache, or mood change.    Objective:   No results found. No results for input(s): WBC, HGB, HCT, PLT in the last 72 hours. No results for input(s): NA, K, CL, CO2, GLUCOSE, BUN, CREATININE, CALCIUM in the last 72 hours.  Intake/Output Summary (Last 24 hours) at 10/02/2019 1016 Last data filed at 10/01/2019 1832 Gross per 24 hour  Intake 440 ml  Output --  Net 440 ml     Physical Exam: Vital Signs Blood pressure (!) 115/57, pulse 74, temperature 98.1 F (36.7 C), resp. rate 19, height 5\' 6"  (1.676 m), weight 95.4 kg, SpO2 97 %.  Physical Exam Constitutional: No distress . Vital signs reviewed. HEENT: EOMI, oral membranes moist Neck: supple Cardiovascular: RRR without murmur. No JVD    Respiratory: CTA Bilaterally without wheezes or rales. Normal effort    GI: BS +, non-tender, non-distended  Skin:, no dressing over the right anterior lower cervical incision.healing well but scratching/exocriation less Neurologic: Decreased sensation to light touch on lateral aspect of R 4th digit and 5th digit c/w R ulnar neuropathy- not pinched nerve in neck  Right 5th digit in abducted position  Alert and oriented x3, motor strength is 3 - in the right deltoid 3 - in the biceps triceps finger flexors and extensors Left upper extremity is 4 - at the deltoid bicep tricep finger flexors and extensors 4/5 in the hip flexors knee extensors ankle dorsiflexors plantar flexors bilaterally  lower extremities---motor exam stable to improved Fine motor is reduced bilateral hands with finger to thumb opposition. Musculoskeletal: good ROM Psych: pleasant and  appropriate   Assessment/Plan: 1. Functional deficits secondary to Cervical myelopathy s/p C5/6 discectomy, C6-7 decompression and plates and screws arthrodesis  to  which require 3+ hours per day of interdisciplinary therapy in a comprehensive inpatient rehab setting.  Physiatrist is providing close team supervision and 24 hour management of active medical problems listed below.  Physiatrist and rehab team continue to assess barriers to discharge/monitor patient progress toward functional and medical goals  Care Tool:  Bathing    Body parts bathed by patient: Right arm, Left arm, Chest, Abdomen, Right upper leg, Left upper leg, Right lower leg, Left lower leg, Face, Front perineal area, Buttocks   Body parts bathed by helper: Buttocks     Bathing assist Assist Level: Supervision/Verbal cueing     Upper Body Dressing/Undressing Upper body dressing   What is the patient wearing?: Pull over shirt    Upper body assist Assist Level: Independent    Lower Body Dressing/Undressing Lower body dressing      What is the patient wearing?: Pants     Lower body assist Assist for lower body dressing: Contact Guard/Touching assist     Toileting Toileting    Toileting assist Assist for toileting: Minimal Assistance - Patient > 75%     Transfers Chair/bed transfer  Transfers assist     Chair/bed transfer assist level: Contact Guard/Touching assist     Locomotion Ambulation   Ambulation assist   Ambulation activity did not occur: Safety/medical concerns  Assist level: Minimal Assistance - Patient > 75% Assistive device: Walker-rolling Max distance: 40'   Walk 10 feet activity   Assist  Walk 10 feet activity did not  occur: Safety/medical concerns  Assist level: Minimal Assistance - Patient > 75% Assistive device: Walker-rolling   Walk 50 feet activity   Assist Walk 50 feet with 2 turns activity did not occur: Safety/medical concerns         Walk 150  feet activity   Assist Walk 150 feet activity did not occur: Safety/medical concerns         Walk 10 feet on uneven surface  activity   Assist Walk 10 feet on uneven surfaces activity did not occur: Safety/medical concerns         Wheelchair     Assist Will patient use wheelchair at discharge?: Yes Type of Wheelchair: Manual Wheelchair activity did not occur: Safety/medical concerns  Wheelchair assist level: Supervision/Verbal cueing Max wheelchair distance: 150'    Wheelchair 50 feet with 2 turns activity    Assist    Wheelchair 50 feet with 2 turns activity did not occur: Safety/medical concerns   Assist Level: Supervision/Verbal cueing   Wheelchair 150 feet activity     Assist  Wheelchair 150 feet activity did not occur: Safety/medical concerns   Assist Level: Supervision/Verbal cueing   Blood pressure (!) 115/57, pulse 74, temperature 98.1 F (36.7 C), resp. rate 19, height 5\' 6"  (1.676 m), weight 95.4 kg, SpO2 97 %.    Medical Problem List and Plan: 1.Right upper and lower extremity weaknesssecondary to cervical myelopathy.S/Pdiscectomy C5-6, 6-7 decompression placement of anterior instrumentation interbody plate and screws arthrodesis 09/09/2019. Cervical collar when out of bed -patient may shower -ELOS/Goals: 10-14d, supervision with mobility, min assist showering and lower extremity dressing mod I/supervision upper limb ADL  12/9- will order eucerin BID for anterior neck incision.  Improving participation? 2. Antithrombotics: -DVT/anticoagulation:SCDs. Check vascular study 11/24- Dopplers (-)  11/25- started lovenox- OK'd by [facility].  -antiplatelet therapy: N/A 3. Pain Management:Oxycodone and Robaxin as needed  11/24- will start Gabapentin 300 mg BID for nerve pain and titrate up as required  11/26- helping nerve pain  12/11- will increase gabapentin to 300 mg TID and add Duloxetine 30 mg daily  for nerve pain- if tolerates, will increase Duloxetine.    12/12--tolerating so far---monitor over weekend 4. Mood:Provide emotional support -antipsychotic agents: N/A 5. Neuropsych: This patientiscapable of making decisions on herown behalf. 6. Skin/Wound Care:Routine skin checks 7. Fluids/Electrolytes/Nutrition:Routine in and outs with follow-up chemistries, eating 75-100% meals  8. 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 Flomax 0.4 mg daily. Check PVR  No ICP needed , ? Wean flomax after a couple weeks 9.  Neurogenic bowel.   Improved, no incont  No need for supp   On senna and docusate sodium, last recorded BM 12/4  12/7- said she had LBM in last 24 hours   12/12- bm today 10. Hypokalemia  Resolved   11/27- K+ 4.2  12/1- K+ 4.0 11. R ulnar neuropathy  11/29- explained what it was- why she likely had it- to avoid compressing nerve by resting arm on W/C arm rest- will likely get better if doesn't compress nerve.   12/6 intermittent, habitually leans on RIght elbow , + Wartenbergs sign Symptomatically doing better, may need OP EMG     LOS: 19 days A FACE TO [place] 10/02/2019, 10:16 AM

## 2019-10-03 ENCOUNTER — Inpatient Hospital Stay (HOSPITAL_COMMUNITY): Payer: Self-pay

## 2019-10-03 DIAGNOSIS — N319 Neuromuscular dysfunction of bladder, unspecified: Secondary | ICD-10-CM

## 2019-10-03 DIAGNOSIS — M792 Neuralgia and neuritis, unspecified: Secondary | ICD-10-CM

## 2019-10-03 DIAGNOSIS — R339 Retention of urine, unspecified: Secondary | ICD-10-CM

## 2019-10-03 NOTE — Progress Notes (Signed)
[place] PHYSICAL MEDICINE & REHABILITATION PROGRESS NOTE   Subjective/Complaints:  Gabapentin has helped pain a lot but has noticed mild nausea in evenings. Also noticing edema in feet  ROS: Patient denies fever, rash, sore throat, blurred vision, nausea, vomiting, diarrhea, cough, shortness of breath or chest pain, joint or back pain, headache, or mood change.   Objective:   No results found. No results for input(s): WBC, HGB, HCT, PLT in the last 72 hours. No results for input(s): NA, K, CL, CO2, GLUCOSE, BUN, CREATININE, CALCIUM in the last 72 hours.  Intake/Output Summary (Last 24 hours) at 10/03/2019 0921 Last data filed at 10/03/2019 0736 Gross per 24 hour  Intake 677 ml  Output -  Net 677 ml     Physical Exam: Vital Signs Blood pressure (!) 112/44, pulse 68, temperature 98.4 F (36.9 C), temperature source Oral, resp. rate 16, height 5\' 6"  (1.676 m), weight 95.4 kg, SpO2 93 %.  Physical Exam Constitutional: No distress . Vital signs reviewed. HEENT: EOMI, oral membranes moist Neck: supple Cardiovascular: RRR without murmur. No JVD    Respiratory: CTA Bilaterally without wheezes or rales. Normal effort    GI: BS +, non-tender, non-distended  Skin:, no dressing over the right anterior lower cervical incision  Neurologic: Decreased sensation to light touch on lateral aspect of R 4th digit and 5th digit c/w R ulnar neuropathy- not pinched nerve in neck  Right 5th digit in abducted position  Alert and oriented x3, motor strength is 3 - in the right deltoid 3 - in the biceps triceps finger flexors and extensors Left upper extremity is 4 - at the deltoid bicep tricep finger flexors and extensors 4/5 in the hip flexors knee extensors ankle dorsiflexors plantar flexors bilaterally  lower extremities---motor exam stable to improved Fine motor is reduced bilateral hands with finger to thumb opposition. Musculoskeletal: good ROM, tr to 1+ pedal edema Psych: pleasant and  appropriate   Assessment/Plan: 1. Functional deficits secondary to Cervical myelopathy s/p C5/6 discectomy, C6-7 decompression and plates and screws arthrodesis  to  which require 3+ hours per day of interdisciplinary therapy in a comprehensive inpatient rehab setting.  Physiatrist is providing close team supervision and 24 hour management of active medical problems listed below.  Physiatrist and rehab team continue to assess barriers to discharge/monitor patient progress toward functional and medical goals  Care Tool:  Bathing    Body parts bathed by patient: Right arm, Left arm, Chest, Abdomen, Right upper leg, Left upper leg, Right lower leg, Left lower leg, Face, Front perineal area, Buttocks   Body parts bathed by helper: Buttocks     Bathing assist Assist Level: Supervision/Verbal cueing     Upper Body Dressing/Undressing Upper body dressing   What is the patient wearing?: Pull over shirt    Upper body assist Assist Level: Independent    Lower Body Dressing/Undressing Lower body dressing      What is the patient wearing?: Pants     Lower body assist Assist for lower body dressing: Contact Guard/Touching assist     Toileting Toileting    Toileting assist Assist for toileting: Minimal Assistance - Patient > 75%     Transfers Chair/bed transfer  Transfers assist     Chair/bed transfer assist level: Contact Guard/Touching assist     Locomotion Ambulation   Ambulation assist   Ambulation activity did not occur: Safety/medical concerns  Assist level: Minimal Assistance - Patient > 75% Assistive device: Walker-rolling Max distance: 40'   Walk  10 feet activity   Assist  Walk 10 feet activity did not occur: Safety/medical concerns  Assist level: Minimal Assistance - Patient > 75% Assistive device: Walker-rolling   Walk 50 feet activity   Assist Walk 50 feet with 2 turns activity did not occur: Safety/medical concerns         Walk 150  feet activity   Assist Walk 150 feet activity did not occur: Safety/medical concerns         Walk 10 feet on uneven surface  activity   Assist Walk 10 feet on uneven surfaces activity did not occur: Safety/medical concerns         Wheelchair     Assist Will patient use wheelchair at discharge?: Yes Type of Wheelchair: Manual Wheelchair activity did not occur: Safety/medical concerns  Wheelchair assist level: Supervision/Verbal cueing Max wheelchair distance: 150'    Wheelchair 50 feet with 2 turns activity    Assist    Wheelchair 50 feet with 2 turns activity did not occur: Safety/medical concerns   Assist Level: Supervision/Verbal cueing   Wheelchair 150 feet activity     Assist  Wheelchair 150 feet activity did not occur: Safety/medical concerns   Assist Level: Supervision/Verbal cueing   Blood pressure (!) 112/44, pulse 68, temperature 98.4 F (36.9 C), temperature source Oral, resp. rate 16, height 5\' 6"  (1.676 m), weight 95.4 kg, SpO2 93 %.    Medical Problem List and Plan: 1.Right upper and lower extremity weaknesssecondary to cervical myelopathy.S/Pdiscectomy C5-6, 6-7 decompression placement of anterior instrumentation interbody plate and screws arthrodesis 09/09/2019. Cervical collar when out of bed -patient may shower -ELOS/Goals: 10-14d, supervision with mobility, min assist showering and lower extremity dressing mod I/supervision upper limb ADL  12/9- will order eucerin BID for anterior neck incision.  Improved participation? 2. Antithrombotics: -DVT/anticoagulation:SCDs. Check vascular study 11/24- Dopplers (-)  11/25- started lovenox- OK'd by [facility].  -antiplatelet therapy: N/A 3. Pain Management:Oxycodone and Robaxin as needed  11/24- will start Gabapentin 300 mg BID for nerve pain and titrate up as required  11/26- helping nerve pain  12/11- will increase gabapentin to 300 mg TID and  add Duloxetine 30 mg daily for nerve pain- if tolerates, will increase Duloxetine.   12/13--gabapentin helping. She's willing to tolerate the mild nausea. Will keep crackers on hand for pm dose.  4. Mood:Provide emotional support -antipsychotic agents: N/A 5. Neuropsych: This patientiscapable of making decisions on herown behalf. 6. Skin/Wound Care:Routine skin checks 7. Fluids/Electrolytes/Nutrition:Routine in and outs with follow-up chemistries, eating 75-100% meals  8. 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 Flomax 0.4 mg daily. Check PVR  No ICP needed , ? Wean flomax after a couple weeks 9.  Neurogenic bowel.   Improved, no incont  No need for supp   On senna and docusate sodium, last recorded BM 12/4  12/7- said she had LBM in last 24 hours   12/12- bm today 10. Hypokalemia  Resolved   11/27- K+ 4.2  12/1- K+ 4.0 11. R ulnar neuropathy  11/29- explained what it was- why she likely had it- to avoid compressing nerve by resting arm on W/C arm rest- will likely get better if doesn't compress nerve.   12/6 intermittent, habitually leans on RIght elbow , + Wartenbergs sign Symptomatically doing better, may need OP EMG   13. Pedal edema  -likely multifactorial related to position, neurologic, pharm and nutritional factors   -encouraged elevation   -should wear KTH's throughout the day   LOS: 20 days A  FACE TO FACE EVALUATION WAS PERFORMED  Ranelle OysterZachary T Swartz 10/03/2019, 9:21 AM

## 2019-10-03 NOTE — Progress Notes (Signed)
Physical Therapy Session Note  Patient Details  Name: Sandra Parrish MRN: [id number] Date of Birth: [birth year]  Today's Date: 10/03/2019 PT Individual Time: 1325-1420 PT Individual Time Calculation (min): 55 min   Short Term Goals: Week 3:  PT Short Term Goal 1 (Week 3): =LTG due to ELOS  Skilled Therapeutic Interventions/Progress Updates:   w/c mobility training x 264' modified independent with focus on overall activity tolerance and functional UE strengthening. NMR on Kinetron in standing (min assist to transfer with RW with cues needed for hand placement and anterior weightshift) for reciprocal movement pattern retraining and strengthening in preparation for stair negotiation x 2 reps of 20 reps each side BLE and seated break. Min assist for sit <> stand on the Kinetron and pt reports increased anxiety first attempt and less the second after she could see she could do it. Emotional support provided. Bed mobility and transfer training in ADL apartment to simulate home environment with focus on technique for bed mobility. Initial attempt required min assist for RLE management but second attempt, when scooting back on bed more, pt able to perform with supervision. Supine to sit with cues for technique and able to perform without need for rail (though prefers it there for mental support and will have one at home). Pt very pleased with progress and encouraged her to attempt this technique when returning to bed later with nursing staff. End of session performed gait with RW in and out of bathroom with min assist and performed toileting (+ void with tiny BM) and requires min assist for standing balance and total assist for hygiene.   Therapy Documentation Precautions:  Precautions Precautions: Fall, Cervical Precaution Comments: reviewed cervical precautions Cervical Brace: Soft collar Restrictions Weight Bearing Restrictions: No  Pain:  No complaints.   Therapy/Group: Individual  Therapy  Canary Brim Ivory Broad, PT, DPT, CBIS  10/03/2019, 2:50 PM

## 2019-10-03 NOTE — Progress Notes (Signed)
Physical Therapy Session Note  Patient Details  Name: Sandra Parrish MRN: [id number] Date of Birth: 1946/04/08  Today's Date: 10/03/2019 PT Individual Time: 1751-0258 PT Individual Time Calculation (min): 60 min   Short Term Goals: Week 1:  PT Short Term Goal 1 (Week 1): Pt will initiate w/c mobility PT Short Term Goal 1 - Progress (Week 1): Met PT Short Term Goal 2 (Week 1): Pt will perform least restrictive transfer with assist x 1 PT Short Term Goal 2 - Progress (Week 1): Met PT Short Term Goal 3 (Week 1): Pt will perform bed mobility with mod A PT Short Term Goal 3 - Progress (Week 1): Met Week 2:  PT Short Term Goal 1 (Week 2): Pt will be at Supervision level for all w/c mobility PT Short Term Goal 1 - Progress (Week 2): Met PT Short Term Goal 2 (Week 2): Pt will perform bed mobility with min A consistently PT Short Term Goal 2 - Progress (Week 2): Progressing toward goal PT Short Term Goal 3 (Week 2): Pt will ambulate x 15 ft via least restrictive method PT Short Term Goal 3 - Progress (Week 2): Met  Skilled Therapeutic Interventions/Progress Updates:    w/c propulsion with BUE for functional mobility training on unit modified independent > 150' and extra time due to decreased endurance. Pt performs stand step transfers with RW throughout session with overall CGA and initially required cues for anterior weightshift and to scoot forward. Blocked practice sit <> stands from mat with RW x 5 reps with close supervision and then progressed without UE's for NMR to address postural control during transitional movements without UE suppport and forced BLE strengthening x 5 reps with min assist progressing to CGA to stabilize. Educated on attempting this technique even when using RW during basic transfers. Gait training with RW for NMR retraining with focus on gait pattern, activation of RLE during stance and coordination during turning x 40' as well as progressing to retro gait x 10' with R with  overall min assist. Stair negotiation attempting with R rail only on 6" step but pt unable to perform 1 step up with max assist due to weakness and pt reporting fear of instability in RLE. Alternating toe taps to 6" step with BUE support x 4 reps x 2 sets with seated break to work on single limb stance and coordination/controlled movements.   Therapy Documentation Precautions:  Precautions Precautions: Fall, Cervical Precaution Comments: reviewed cervical precautions Cervical Brace: Soft collar Restrictions Weight Bearing Restrictions: No  Pain:  Denies pain. Just reports discomfort in "band area"  Therapy/Group: Individual Therapy  Canary Brim Ivory Broad, PT, DPT, CBIS  10/03/2019, 9:59 AM

## 2019-10-04 ENCOUNTER — Inpatient Hospital Stay (HOSPITAL_COMMUNITY): Payer: Self-pay | Admitting: Physical Therapy

## 2019-10-04 ENCOUNTER — Inpatient Hospital Stay (HOSPITAL_COMMUNITY): Payer: Self-pay | Admitting: Occupational Therapy

## 2019-10-04 LAB — CBC WITH DIFFERENTIAL/PLATELET
Abs Immature Granulocytes: 0.02 10*3/uL (ref 0.00–0.07)
Basophils Absolute: 0.1 10*3/uL (ref 0.0–0.1)
Basophils Relative: 1 %
Eosinophils Absolute: 0.2 10*3/uL (ref 0.0–0.5)
Eosinophils Relative: 4 %
HCT: 34.6 % — ABNORMAL LOW (ref 36.0–46.0)
Hemoglobin: 11.1 g/dL — ABNORMAL LOW (ref 12.0–15.0)
Immature Granulocytes: 0 %
Lymphocytes Relative: 25 %
Lymphs Abs: 1.6 10*3/uL (ref 0.7–4.0)
MCH: 30.6 pg (ref 26.0–34.0)
MCHC: 32.1 g/dL (ref 30.0–36.0)
MCV: 95.3 fL (ref 80.0–100.0)
Monocytes Absolute: 0.5 10*3/uL (ref 0.1–1.0)
Monocytes Relative: 8 %
Neutro Abs: 4 10*3/uL (ref 1.7–7.7)
Neutrophils Relative %: 62 %
Platelets: 263 10*3/uL (ref 150–400)
RBC: 3.63 MIL/uL — ABNORMAL LOW (ref 3.87–5.11)
RDW: 12.4 % (ref 11.5–15.5)
WBC: 6.5 10*3/uL (ref 4.0–10.5)
nRBC: 0 % (ref 0.0–0.2)

## 2019-10-04 LAB — BASIC METABOLIC PANEL
Anion gap: 10 (ref 5–15)
BUN: 13 mg/dL (ref 8–23)
CO2: 24 mmol/L (ref 22–32)
Calcium: 8.4 mg/dL — ABNORMAL LOW (ref 8.9–10.3)
Chloride: 106 mmol/L (ref 98–111)
Creatinine, Ser: 0.67 mg/dL (ref 0.44–1.00)
GFR calc Af Amer: >60 mL/min (ref >60)
GFR calc non Af Amer: >60 mL/min (ref >60)
Glucose, Bld: 105 mg/dL — ABNORMAL HIGH (ref 70–99)
Potassium: 3.9 mmol/L (ref 3.5–5.1)
Sodium: 140 mmol/L (ref 135–145)

## 2019-10-04 NOTE — Progress Notes (Signed)
Occupational Therapy Session Note  Patient Details  Name: Sandra Parrish MRN: [id number] Date of Birth: 03-Oct-1946  Today's Date: 10/04/2019 OT Individual Time: 3500-9381 OT Individual Time Calculation (min): 59 min    Short Term Goals: Week 3:  OT Short Term Goal 1 (Week 3): STG=LTG due to LOS  Skilled Therapeutic Interventions/Progress Updates:    Upon entering the room, pt seated in wheelchair awaiting OT arrival. Pt requesting to shower this session. No c/o pain. Pt standing with min guard and ambulating with RW and min A 15' into bathroom and onto TTB. Pt remained seated while bathing and utilized [facility] reacher this session to increase independence with self care tasks. Pt ambulating with min A back out to wheelchair where she dresses self with sit <>stands this session. Pt able to don UB clothing with set up A and LB clothing with min guard/min A for balance tasks. Pt able to thread clothing onto B feet without use of AE. Pt ambulating and standing at sink with min guard to comb hair before returning back to wheelchair. Pt remained seated with call bell and all needed items within reach upon exiting the room.   Therapy Documentation Precautions:  Precautions Precautions: Fall, Cervical Precaution Comments: reviewed cervical precautions Cervical Brace: Soft collar Restrictions Weight Bearing Restrictions: No   Therapy/Group: Individual Therapy  Gypsy Decant 10/04/2019, 12:36 PM

## 2019-10-04 NOTE — Progress Notes (Signed)
  Patient ID: Sandra Parrish, female   DOB: [birth year], 73 y.o.   MRN: [id number]    Diagnosis codes:  M48.02;  M50.023;  G95.29 and Z48.89  Height:     5'6"           Weight:    210 lbs        Patient suffers from cervical stenosis, cervical myelopathy, spinal cord compression which impairs their ability to perform daily activities like bathing, dressing and mobility in the home.  A walker will not resolve issue with performing activities of daily living.  A wheelchair will allow patient to safely perform daily activities.  Patient is not able to propel themselves in the home using a standard weight wheelchair due to significant extremity weakness.  Patient can self propel in the lightweight wheelchair.  Lauraine Rinne, PA-C

## 2019-10-04 NOTE — Progress Notes (Signed)
Physical Therapy Session Note  Patient Details  Name: Sandra Parrish MRN: [id number] Date of Birth: [birth year]  Today's Date: 10/04/2019 PT Individual Time: 1300-1415 PT Individual Time Calculation (min): 75 min   Short Term Goals: Week 3:  PT Short Term Goal 1 (Week 3): =LTG due to ELOS  Skilled Therapeutic Interventions/Progress Updates:    Pt received seated in w/c in room, agreeable to PT session. Pt's friend provided copy of home measurement sheet. Pt's w/c should fit through apartment doorway and bedroom doorway, pt will need to utilize RW to enter her bathroom. Pt appears to have to be able to perform 7" stairs to be able to get up to her apartment with use of R handrail for first set of stairs and L handrail for second set of stairs. Dependent transport via w/c to therapy gym for time conservation. Sit to stand with CGA in // bars. Pt is able to perform R and L 6" step-taps with BUE support and min A for balance. Pt is able to perform step-ups onto 6" step with BUE support and min A for balance ascending with LLE first. Took pt over to stairs. Pt is unable to ascend 6" step with use of single R handrail rail attempting to ascend with LLE first. Pt is able to ascend/descend 4 x 3" stairs with R handrail and min A for balance. Will continue to work on BLE strengthening and stair management techniques in order for pt to be able to perform stairs safely. Sit to/from supine on real bed in simulation apartment at Supervision level with use of bedrail. Pt exhibits improved ability to perform bed mobility with increased independence. Pt is encouraged by progress she has made with regards to bed mobility but is also discouraged by ongoing difficulty with stairs. Attempted to have phone conference with patient's friend [name] in order to setup family education session and discuss assist that pt will require with stairs upon d/c home, pt's friend unavailable so will attempt to contact her during next  session. Also discussed extending patient LOS due to ongoing difficulty with stairs, pt receptive to idea. Will discuss with team. Pt left seated in w/c in room with needs in reach at end of session.  Therapy Documentation Precautions:  Precautions Precautions: Fall, Cervical Precaution Comments: reviewed cervical precautions Cervical Brace: Soft collar Restrictions Weight Bearing Restrictions: No    Therapy/Group: Individual Therapy   Excell Seltzer, PT, DPT  10/04/2019, 4:00 PM

## 2019-10-04 NOTE — Progress Notes (Signed)
[facility] PHYSICAL MEDICINE & REHABILITATION PROGRESS NOTE   Subjective/Complaints:  Pt reports mild-moderate edema of feet- could be caused by Gabapentin?- d/w Dr Sonia Side using TEDs during day and to remove at night. In shower currently.   ROS: Patient denies fever, rash, sore throat, blurred vision, nausea, vomiting, diarrhea, cough, shortness of breath or chest pain, joint or back pain, headache, or mood change.   Objective:   No results found. Recent Labs    10/04/19 0620  WBC 6.5  HGB 11.1*  HCT 34.6*  PLT 263   Recent Labs    10/04/19 0620  NA 140  K 3.9  CL 106  CO2 24  GLUCOSE 105*  BUN 13  CREATININE 0.67  CALCIUM 8.4*    Intake/Output Summary (Last 24 hours) at 10/04/2019 0908 Last data filed at 10/04/2019 0723 Gross per 24 hour  Intake 780 ml  Output -  Net 780 ml     Physical Exam: Vital Signs Blood pressure 130/64, pulse 72, temperature 98.1 F (36.7 C), resp. rate 17, height 5\' 6"  (1.676 m), weight 95.4 kg, SpO2 94 %.  Physical Exam Constitutional: No distress . Vital signs reviewed. Sitting up, taking shower with OT supervision- showed me body parts to examine, NAD  HEENT: EOMI, oral membranes moist Neck: supple Cardiovascular: RRR without murmur. No JVD    Respiratory: CTA Bilaterally without wheezes or rales. Normal effort    GI: BS +, non-tender, non-distended  Skin:, no dressing over the right anterior lower cervical incision  Neurologic: Decreased sensation to light touch on lateral aspect of R 4th digit and 5th digit c/w R ulnar neuropathy- not pinched nerve in neck  Right 5th digit in abducted position  Alert and oriented x3, motor strength is 3 - in the right deltoid 3 - in the biceps triceps finger flexors and extensors Left upper extremity is 4 - at the deltoid bicep tricep finger flexors and extensors 4/5 in the hip flexors knee extensors ankle dorsiflexors plantar flexors bilaterally  lower extremities---motor exam stable to  improved Fine motor is reduced bilateral hands with finger to thumb opposition. Musculoskeletal: good ROM, tr to 1+ pedal edema in AM- not wearing TEDs b/c in shower Psych: pleasant and appropriate   Assessment/Plan: 1. Functional deficits secondary to Cervical myelopathy s/p C5/6 discectomy, C6-7 decompression and plates and screws arthrodesis  to  which require 3+ hours per day of interdisciplinary therapy in a comprehensive inpatient rehab setting.  Physiatrist is providing close team supervision and 24 hour management of active medical problems listed below.  Physiatrist and rehab team continue to assess barriers to discharge/monitor patient progress toward functional and medical goals  Care Tool:  Bathing    Body parts bathed by patient: Right arm, Left arm, Chest, Abdomen, Right upper leg, Left upper leg, Right lower leg, Left lower leg, Face, Front perineal area, Buttocks   Body parts bathed by helper: Buttocks     Bathing assist Assist Level: Supervision/Verbal cueing     Upper Body Dressing/Undressing Upper body dressing   What is the patient wearing?: Pull over shirt    Upper body assist Assist Level: Independent    Lower Body Dressing/Undressing Lower body dressing      What is the patient wearing?: Pants     Lower body assist Assist for lower body dressing: Contact Guard/Touching assist     Toileting Toileting    Toileting assist Assist for toileting: Moderate Assistance - Patient 50 - 74%     Transfers  Chair/bed transfer  Transfers assist     Chair/bed transfer assist level: Contact Guard/Touching assist     Locomotion Ambulation   Ambulation assist   Ambulation activity did not occur: Safety/medical concerns  Assist level: Minimal Assistance - Patient > 75% Assistive device: Walker-rolling Max distance: 40'   Walk 10 feet activity   Assist  Walk 10 feet activity did not occur: Safety/medical concerns  Assist level: Minimal  Assistance - Patient > 75% Assistive device: Walker-rolling   Walk 50 feet activity   Assist Walk 50 feet with 2 turns activity did not occur: Safety/medical concerns         Walk 150 feet activity   Assist Walk 150 feet activity did not occur: Safety/medical concerns         Walk 10 feet on uneven surface  activity   Assist Walk 10 feet on uneven surfaces activity did not occur: Safety/medical concerns         Wheelchair     Assist Will patient use wheelchair at discharge?: Yes Type of Wheelchair: Manual Wheelchair activity did not occur: Safety/medical concerns  Wheelchair assist level: Supervision/Verbal cueing Max wheelchair distance: 150'    Wheelchair 50 feet with 2 turns activity    Assist    Wheelchair 50 feet with 2 turns activity did not occur: Safety/medical concerns   Assist Level: Supervision/Verbal cueing   Wheelchair 150 feet activity     Assist  Wheelchair 150 feet activity did not occur: Safety/medical concerns   Assist Level: Supervision/Verbal cueing   Blood pressure 130/64, pulse 72, temperature 98.1 F (36.7 C), resp. rate 17, height 5\' 6"  (1.676 m), weight 95.4 kg, SpO2 94 %.    Medical Problem List and Plan: 1.Right upper and lower extremity weaknesssecondary to cervical myelopathy.S/Pdiscectomy C5-6, 6-7 decompression placement of anterior instrumentation interbody plate and screws arthrodesis 09/09/2019. Cervical collar when out of bed -patient may shower -ELOS/Goals: 10-14d, supervision with mobility, min assist showering and lower extremity dressing mod I/supervision upper limb ADL  12/9- will order eucerin BID for anterior neck incision.  Improved participation? 2. Antithrombotics: -DVT/anticoagulation:SCDs. Check vascular study 11/24- Dopplers (-)  11/25- started lovenox- OK'd by [facility].  -antiplatelet therapy: N/A 3. Pain Management:Oxycodone and Robaxin as  needed  11/24- will start Gabapentin 300 mg BID for nerve pain and titrate up as required  11/26- helping nerve pain  12/11- will increase gabapentin to 300 mg TID and add Duloxetine 30 mg daily for nerve pain- if tolerates, will increase Duloxetine.   12/13--gabapentin helping. She's willing to tolerate the mild nausea. Will keep crackers on hand for pm dose.  4. Mood:Provide emotional support -antipsychotic agents: N/A 5. Neuropsych: This patientiscapable of making decisions on herown behalf. 6. Skin/Wound Care:Routine skin checks 7. Fluids/Electrolytes/Nutrition:Routine in and outs with follow-up chemistries, eating 75-100% meals  8. 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 Flomax 0.4 mg daily. Check PVR  No ICP needed , ? Wean flomax after a couple weeks 9.  Neurogenic bowel.   Improved, no incont  No need for supp   On senna and docusate sodium, last recorded BM 12/4  12/7- said she had LBM in last 24 hours   12/12- bm today  12/14- BM regularly.  10. Hypokalemia  Resolved   11/27- K+ 4.2  12/1- K+ 4.0 11. R ulnar neuropathy  11/29- explained what it was- why she likely had it- to avoid compressing nerve by resting arm on W/C arm rest- will likely get better if doesn't compress nerve.  12/6 intermittent, habitually leans on RIght elbow , + Wartenbergs sign Symptomatically doing better, may need OP EMG   13. Pedal edema  -likely multifactorial related to position, neurologic, pharm and nutritional factors   -encouraged elevation   -should wear KTH's throughout the day    LOS: 21 days A FACE TO FACE EVALUATION WAS PERFORMED  Carmin Dibartolo 10/04/2019, 9:08 AM

## 2019-10-04 NOTE — Plan of Care (Signed)
  Problem: Consults Goal: RH SPINAL CORD INJURY PATIENT EDUCATION Description:  See Patient Education module for education specifics.  Outcome: Progressing   Problem: SCI BOWEL ELIMINATION Goal: RH STG MANAGE BOWEL WITH ASSISTANCE Description: STG Manage Bowel with mod I Assistance. Outcome: Progressing Goal: RH STG SCI MANAGE BOWEL WITH MEDICATION WITH ASSISTANCE Description: STG SCI Manage bowel with medication with mod I assistance. Outcome: Progressing   Problem: SCI BLADDER ELIMINATION Goal: RH OTHER STG BLADDER ELIMINATION GOALS W/ASSIST Description: Other STG Bladder Elimination Goals With mod I Assistance Outcome: Progressing   Problem: RH SAFETY Goal: RH STG ADHERE TO SAFETY PRECAUTIONS W/ASSISTANCE/DEVICE Description: STG Adhere to Safety Precautions With cues and reminders Outcome: Progressing   Problem: RH PAIN MANAGEMENT Goal: RH STG PAIN MANAGED AT OR BELOW PT'S PAIN GOAL Description: Pain level less than 4 on scale of 0-10 Outcome: Progressing   Problem: RH KNOWLEDGE DEFICIT SCI Goal: RH STG INCREASE KNOWLEDGE OF SELF CARE AFTER SCI Description: Pt will be able to demonstrate understanding of fall prevention and proper steps to call for help in case of emergency independently upon discharge. Pt will be able to identify and eliminate environmental hazards that can lead to pt's falling independently.  Outcome: Progressing

## 2019-10-04 NOTE — Progress Notes (Signed)
Physical Therapy Session Note  Patient Details  Name: Sandra Parrish MRN: [id number] Date of Birth: Oct 26, 1945  Today's Date: 10/04/2019 PT Individual Time: 1020-1118 PT Individual Time Calculation (min): 58 min   Short Term Goals: Week 3:  PT Short Term Goal 1 (Week 3): =LTG due to ELOS  Skilled Therapeutic Interventions/Progress Updates: Pt presented in w/c agreeable to therapy. Pt propelled to rehab gym mod I with supervision for turning into room. Session focused on stair training and R hip strengthening. Pt explained continues to have difficulty lifting RLE onto 6 in step. Pt was able to ascend/descend 3in step x 8 using L rail and leading with RLE only, PTA encouraged pt to attempt to lift RLE higher than step for increased hip flexion recruitment. Pt states feels ok with 3in step but has significant increased anxiety when trying higher steps. PTA then transported pt to parallel bars and practiced 6in step both forward and laterally in bars. Pt was able to successfully clear x 4 in each direction then unable to clear any additional due to fatigue. Once pt back in w/c transported to mat and performed stand pivot to to high/low mat CGA. Pt then transferred to supine with minA for BLE management and set up in sidelying for use of powder board. Pt participated in hip flexion on powder board and clamshells 2 x 15 ea. PTA explained to try and perform activity in bed when in sidelying for increased ms recruitment as is gravity eliminated. Pt verbalized understanding. Once completed pt transferred sidelying to sitting with minA. Pt then participated in gait with RW 28f x 1 with w/c follow and CGA pt noted to have poor R foot clearance with fatigue with x 1 occurrence of toes dragging on floor. Pt transported back to room at end of session for time management and left with call bell within reach and current needs met.      Therapy Documentation Precautions:  Precautions Precautions: Fall,  Cervical Precaution Comments: reviewed cervical precautions Cervical Brace: Soft collar Restrictions Weight Bearing Restrictions: No General:   Vital Signs: Therapy Vitals Temp: 97.6 F (36.4 C) Pulse Rate: 71 Resp: 20 BP: (!) 104/54 Patient Position (if appropriate): Sitting Oxygen Therapy SpO2: 97 % O2 Device: Room Air      Therapy/Group: Individual Therapy  Jaevian Shean  Brock Mokry, PTA  10/04/2019, 4:01 PM

## 2019-10-05 ENCOUNTER — Inpatient Hospital Stay (HOSPITAL_COMMUNITY): Payer: Self-pay | Admitting: Physical Therapy

## 2019-10-05 ENCOUNTER — Inpatient Hospital Stay (HOSPITAL_COMMUNITY): Payer: Self-pay | Admitting: Occupational Therapy

## 2019-10-05 MED ORDER — DULOXETINE HCL 30 MG PO CPEP: 60.0000 mg | ORAL_CAPSULE | Freq: Every day | ORAL | Status: DC

## 2019-10-05 NOTE — Progress Notes (Signed)
Social Work Patient ID: Sandra Parrish, female   DOB: 04/05/1946, 73 y.o.   MRN: [id number]   Met with pt this afternoon to review team conference.  She is aware that team continues to target d/c for this Friday and need for education with friend, Katie,  prior to this d/c.  Due to limitations in friend's schedule, it seems we will need to consider education on the day of d/c.  Still to confirm with pt.    She is very pleased with progress but admits to having "butterflies in my stomach" about going home.  We reviewed HH and DME referrals that I am making.  She has friends and neighbors prepared to check in intermittently.  Will recommend, also, that she alert closest fire station to her situation should there be an emergency.    HOYLE, LUCY, LCSW 

## 2019-10-05 NOTE — Patient Care Conference (Signed)
Inpatient RehabilitationTeam Conference and Plan of Care Update Date: 10/05/2019   Time: 11:15 AM   Patient Name: Sandra Parrish      Medical Record Number: [id number]  Date of Birth: Nov 07, 1945 Sex: Female         Room/Bed: 4M12C/4M12C-01 Payor Info: Payor: MEDICARE / Plan: MEDICARE PART A / Product Type: *No Product type* /    Admit Date/Time:  09/13/2019  1:20 PM  Primary Diagnosis:  Cervical myelopathy ([facility])  Patient Active Problem List   Diagnosis Date Noted  . Neurogenic bladder 09/14/2019  . Cervical myelopathy ([place]) 09/13/2019  . Cervical spinal stenosis   . Bradycardia   . Acute blood loss anemia   . Urinary retention   . Postoperative pain   . Drug induced constipation   . Stenosis of cervical spine with myelopathy ([place]) 09/07/2019    Expected Discharge Date: Expected Discharge Date: 10/08/19  Team Members Present: Physician leading conference: Dr. Courtney Heys Social Worker Present: Lennart Pall, LCSW Nurse Present: Rayetta Pigg, RN Case Manager: Karene Fry, RN PT Present: Excell Seltzer, PT OT Present: Amy Rounds, OT SLP Present: Jettie Booze, CF-SLP PPS Coordinator present : Ileana Ladd, Burna Mortimer, SLP     Current Status/Progress Goal Weekly Team Focus  Bowel/Bladder   continent of B&B, LBM 12/14  remain continent  assess toileting q shift and prn   Swallow/Nutrition/ Hydration             ADL's   min A ambulation/transfers with RW, set up /S UB self care, bathing with min A, min A LB self care  Upgraded to mod I w/c level overall. Supervision bathing and min A tub/shower transfers  ADL retraining, functional transfers/mobility, neuro re-ed, standing balance/endurance, d/c planning   Mobility   Supervision bed mobility, CGA sit to stand and SPT with RW, gait up to 40' with RW CGA to min A, min A 3" stairs with R handrail  upgraded to mod I overall, min A stairs  6" stairs, setting up family education, d/c planning   Communication              Safety/Cognition/ Behavioral Observations            Pain   no c/o pain, has prns  remain pain free  assess pain q shift and prn   Skin   anterior neck incision, bruising on arm/leg/face  prevent further skin breakdown  assess skin shfit and prn    Rehab Goals Patient on target to meet rehab goals: Yes *See Care Plan and progress notes for long and short-term goals.     Barriers to Discharge  Current Status/Progress Possible Resolutions Date Resolved   Nursing                  PT  Inaccessible home environment;Decreased caregiver support;Home environment access/layout;Lack of/limited family support  has to be able to ascend/descend 7" stairs to enter her apartment              OT                  SLP                SW                Discharge Planning/Teaching Needs:  home with intermittent assistance of friends  Need to schedule education sessions with friends   Team Discussion: SCI pain, mild nausea, wear TEDs during day due to edema.  RN R hand numbness/tingling, C-collar for comfort (not wearing), bowel program.  PT not wearing collar, rubbing on neck incision, bed and transfers and gait S/CGA, stairs min A.  OT ADLs setup/S, minguard balance, needs assist with TEDs.   Revisions to Treatment Plan: N/A     Medical Summary Current Status: feels like nerve pain better; Supervision for transfers.gait; stairs min assist Weekly Focus/Goal: collar for comfort only; discharge 12/18- ADLs at supervision/S/U level- min assist at times- will need A for TEDs  Barriers to Discharge: Decreased family/caregiver support;Home enviroment access/layout;Neurogenic Bowel & Bladder;Weight  Barriers to Discharge Comments: n/a     Continued Need for Acute Rehabilitation Level of Care: The patient requires daily medical management by a physician with specialized training in physical medicine and rehabilitation for the following reasons: Direction of a multidisciplinary physical  rehabilitation program to maximize functional independence : Yes Medical management of patient stability for increased activity during participation in an intensive rehabilitation regime.: Yes Analysis of laboratory values and/or radiology reports with any subsequent need for medication adjustment and/or medical intervention. : Yes   I attest that I was present, lead the team conference, and concur with the assessment and plan of the team.   Trish Mage 10/05/2019, 9:46 PM  Team conference was held via web/ teleconference due to COVID - 19

## 2019-10-05 NOTE — Progress Notes (Signed)
Occupational Therapy Session Note  Patient Details  Name: Sandra Parrish MRN: [id number] Date of Birth: Jan 14, 1946  Today's Date: 10/05/2019 OT Individual Time: 1130-1202   & 1257-1345 OT Individual Time Calculation (min): 32 min   &  48 min   Short Term Goals: Week 3:  OT Short Term Goal 1 (Week 3): STG=LTG due to LOS  Skilled Therapeutic Interventions/Progress Updates:    am session:  Patient completed hand dexterity, grip and pinch exercises - mild fatigue with repetition.  Completed wrist and forearm exercises.Marland Kitchen she was able to open bottles and caps on self care items with less difficulty - provided dycem to assist with opening tighter jars.  Reviewed placement of items in home environment and appropriate use of reacher for ease of access from the w/c.  She remained in the w/c at close of session with call bell in reach.    Pm session:  Patient seated in w/c, ready for therapy.  Mod A to donn and fasten velcro sneakers.  Reviewed foot wear that she plans to wear at home.  SPT to/from w/c and mat table with CS using RW.  Completed seated core/proximal stability/scapular and reach exercises with good carryover of back/cervical precautions.  Completed standing balance and reach activities with CS.  Reviewed HM, pet care plans for return to home - she demonstrates a good understanding of her current abilities and limitations.   She remained in the w/c upon return to room, call bell and tray table in reach.    Therapy Documentation Precautions:  Precautions Precautions: Fall, Cervical Precaution Comments: reviewed cervical precautions Cervical Brace: Soft collar Restrictions Weight Bearing Restrictions: No General:   Vital Signs: Therapy Vitals Temp: 97.7 F (36.5 C) Temp Source: Oral Pulse Rate: 64 Resp: 14 BP: (!) 107/51 Patient Position (if appropriate): Sitting Oxygen Therapy SpO2: 97 % O2 Device: Room Air Pain: Pain Assessment Pain Scale: 0-10 Pain Score: 0-No  pain Other Treatments:     Therapy/Group: Individual Therapy  Carlos Levering 10/05/2019, 3:35 PM

## 2019-10-05 NOTE — Progress Notes (Signed)
[facility] PHYSICAL MEDICINE & REHABILITATION PROGRESS NOTE   Subjective/Complaints:  Pt reports abdominal band of tightness- at level SCI pain is better overall, however more this AM because did so much core training yesterday- likes Duloxetine-thinks it's working.  NO side effects.  ROS: Patient denies fever, rash, sore throat, blurred vision, nausea, vomiting, diarrhea, cough, shortness of breath or chest pain, joint or back pain, headache, or mood change.   Objective:   No results found. Recent Labs    10/04/19 0620  WBC 6.5  HGB 11.1*  HCT 34.6*  PLT 263   Recent Labs    10/04/19 0620  NA 140  K 3.9  CL 106  CO2 24  GLUCOSE 105*  BUN 13  CREATININE 0.67  CALCIUM 8.4*    Intake/Output Summary (Last 24 hours) at 10/05/2019 0930 Last data filed at 10/05/2019 0715 Gross per 24 hour  Intake 774 ml  Output --  Net 774 ml     Physical Exam: Vital Signs Blood pressure 109/76, pulse 68, temperature 98 F (36.7 C), resp. rate 18, height 5\' 6"  (1.676 m), weight 95.4 kg, SpO2 96 %.  Physical Exam Constitutional: No distress . Vital signs reviewed. Sitting up, in manual w/c, appropriate, smiling, NAD  HEENT: EOMI, oral membranes moist Neck: supple Cardiovascular: RRR without murmur. No JVD    Respiratory: CTA Bilaterally without wheezes or rales. Normal effort    GI: BS +, non-tender, non-distended  Skin:, no dressing over the right anterior lower cervical incision  Neurologic: Decreased sensation to light touch on lateral aspect of R 4th digit and 5th digit c/w R ulnar neuropathy- not pinched nerve in neck  Right 5th digit in abducted position  Alert and oriented x3, motor strength is 3 - in the right deltoid 3 - in the biceps triceps finger flexors and extensors Left upper extremity is 4 - at the deltoid bicep tricep finger flexors and extensors 4/5 in the hip flexors knee extensors ankle dorsiflexors plantar flexors bilaterally  lower extremities---motor exam  stable to improved Fine motor is reduced bilateral hands with finger to thumb opposition. Musculoskeletal: good ROM, tr to 1+ pedal edema in AM-  Psych: pleasant and appropriate   Assessment/Plan: 1. Functional deficits secondary to Cervical myelopathy s/p C5/6 discectomy, C6-7 decompression and plates and screws arthrodesis  to  which require 3+ hours per day of interdisciplinary therapy in a comprehensive inpatient rehab setting.  Physiatrist is providing close team supervision and 24 hour management of active medical problems listed below.  Physiatrist and rehab team continue to assess barriers to discharge/monitor patient progress toward functional and medical goals  Care Tool:  Bathing    Body parts bathed by patient: Right arm, Left arm, Chest, Abdomen, Right upper leg, Left upper leg, Right lower leg, Left lower leg, Face, Front perineal area, Buttocks   Body parts bathed by helper: Buttocks     Bathing assist Assist Level: Supervision/Verbal cueing     Upper Body Dressing/Undressing Upper body dressing   What is the patient wearing?: Pull over shirt    Upper body assist Assist Level: Set up assist    Lower Body Dressing/Undressing Lower body dressing      What is the patient wearing?: Pants     Lower body assist Assist for lower body dressing: Contact Guard/Touching assist     Toileting Toileting    Toileting assist Assist for toileting: Moderate Assistance - Patient 50 - 74%     Transfers Chair/bed transfer  Transfers  assist     Chair/bed transfer assist level: Contact Guard/Touching assist     Locomotion Ambulation   Ambulation assist   Ambulation activity did not occur: Safety/medical concerns  Assist level: Contact Guard/Touching assist Assistive device: Walker-rolling Max distance: 20'   Walk 10 feet activity   Assist  Walk 10 feet activity did not occur: Safety/medical concerns  Assist level: Contact Guard/Touching  assist Assistive device: Walker-rolling   Walk 50 feet activity   Assist Walk 50 feet with 2 turns activity did not occur: Safety/medical concerns         Walk 150 feet activity   Assist Walk 150 feet activity did not occur: Safety/medical concerns         Walk 10 feet on uneven surface  activity   Assist Walk 10 feet on uneven surfaces activity did not occur: Safety/medical concerns         Wheelchair     Assist Will patient use wheelchair at discharge?: Yes Type of Wheelchair: Manual Wheelchair activity did not occur: Safety/medical concerns  Wheelchair assist level: Independent Max wheelchair distance: 150'    Wheelchair 50 feet with 2 turns activity    Assist    Wheelchair 50 feet with 2 turns activity did not occur: Safety/medical concerns   Assist Level: Independent   Wheelchair 150 feet activity     Assist  Wheelchair 150 feet activity did not occur: Safety/medical concerns   Assist Level: Independent   Blood pressure 109/76, pulse 68, temperature 98 F (36.7 C), resp. rate 18, height 5\' 6"  (1.676 m), weight 95.4 kg, SpO2 96 %.    Medical Problem List and Plan: 1.Right upper and lower extremity weaknesssecondary to cervical myelopathy.S/Pdiscectomy C5-6, 6-7 decompression placement of anterior instrumentation interbody plate and screws arthrodesis 09/09/2019. Cervical collar when out of bed -patient may shower -ELOS/Goals: 10-14d, supervision with mobility, min assist showering and lower extremity dressing mod I/supervision upper limb ADL  12/9- will order eucerin BID for anterior neck incision.  Improved participation? 2. Antithrombotics: -DVT/anticoagulation:SCDs. Check vascular study 11/24- Dopplers (-)  11/25- started lovenox- OK'd by [facility].  -antiplatelet therapy: N/A 3. Pain Management:Oxycodone and Robaxin as needed  11/24- will start Gabapentin 300 mg BID for nerve pain and  titrate up as required  11/26- helping nerve pain  12/11- will increase gabapentin to 300 mg TID and add Duloxetine 30 mg daily for nerve pain- if tolerates, will increase Duloxetine.   12/13--gabapentin helping. She's willing to tolerate the mild nausea. Will keep crackers on hand for pm dose.  12/15- wait to increase Duloxetine for 7 days since had some nausea with it- go up on 12/18!  4. Mood:Provide emotional support -antipsychotic agents: N/A 5. Neuropsych: This patientiscapable of making decisions on herown behalf. 6. Skin/Wound Care:Routine skin checks 7. Fluids/Electrolytes/Nutrition:Routine in and outs with follow-up chemistries, eating 75-100% meals  8. 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 Flomax 0.4 mg daily. Check PVR  No ICP needed , ? Wean flomax after a couple weeks 9.  Neurogenic bowel.   Improved, no incont  No need for supp   On senna and docusate sodium, last recorded BM 12/4  12/7- said she had LBM in last 24 hours   12/12- bm today  12/14- BM regularly.  10. Hypokalemia  Resolved   11/27- K+ 4.2  12/1- K+ 4.0 11. R ulnar neuropathy  11/29- explained what it was- why she likely had it- to avoid compressing nerve by resting arm on W/C arm rest- will likely  get better if doesn't compress nerve.   12/6 intermittent, habitually leans on RIght elbow , + Wartenbergs sign Symptomatically doing better, may need OP EMG   13. Pedal edema  -likely multifactorial related to position, neurologic, pharm and nutritional factors   -encouraged elevation   -should wear KTH's throughout the day    LOS: 22 days A FACE TO FACE EVALUATION WAS PERFORMED  Kristian Mogg 10/05/2019, 9:30 AM

## 2019-10-05 NOTE — Progress Notes (Signed)
Physical Therapy Weekly Progress Note  Patient Details  Name: Sandra Parrish MRN: [id number] Date of Birth: 22-Aug-1946  Beginning of progress report period: September 28, 2019 End of progress report period: October 05, 2019  Today's Date: 10/05/2019 PT Individual Time: 0900-1000 PT Individual Time Calculation (min): 60 min   Patient has met 0 of 1 short term goals.  LTG were set as STG due to ELOS, pt plans to d/c later this week and is making progress towards LTG. Pt has made great progress over the past week and is able to complete bed mobility with Supervision level assist with use of a bedrail which she has purchased for use at home, is Supervision to [facility] for sit to stand and stand pivot transfer with RW, is able to ambulate up to 100 ft with RW at Supervision to CGA level, and is mod I for w/c mobility. Pt is at min A level for navigating 6" stairs with use of one handrail.  Patient continues to demonstrate the following deficits muscle weakness, abnormal tone and unbalanced muscle activation and decreased balance strategies and therefore will continue to benefit from skilled PT intervention to increase functional independence with mobility.  Patient progressing toward long term goals..  Continue plan of care.  PT Short Term Goals Week 3:  PT Short Term Goal 1 (Week 3): =LTG due to ELOS PT Short Term Goal 1 - Progress (Week 3): Progressing toward goal Week 4:  PT Short Term Goal 1 (Week 4): =LTG due to ELOS  Skilled Therapeutic Interventions/Progress Updates:    Pt received seated in w/c in room, agreeable to PT session. No complaints of pain during session. Sit to stand with Supervision to RW throughout session. Ascend/descend 3 x 6" stairs with 2 handrails and min A for balance, step-to gait pattern. Pt then progresses to ascending/descending 2 x 6" stairs with R handrail and min HHA with LUE and 2 x 6" stairs with L handrail and min HHA with RUE. Pt exhibits improved ability to perform  stairs safely via this method. Reviewed car transfer at SUV seat height. Pt is min A for management of LLE in/out of car but exhibits decreased assist needed to stand to RW from car seat height. Ambulation x 100 ft with RW and Supervision to CGA. Pt exhibits improved tolerance for gait training this date. Pt left seated in w/c in room with needs in reach at end of therapy session.  Therapy Documentation Precautions:  Precautions Precautions: Fall, Cervical Precaution Comments: reviewed cervical precautions Cervical Brace: Soft collar Restrictions Weight Bearing Restrictions: No   Therapy/Group: Individual Therapy   Excell Seltzer, PT, DPT 10/05/2019, 12:56 PM

## 2019-10-05 NOTE — Progress Notes (Signed)
Physical Therapy Session Note  Patient Details  Name: Sandra Parrish MRN: [id number] Date of Birth: 04-Mar-1946  Today's Date: 10/05/2019 PT Individual Time: 1435-1533 PT Individual Time Calculation (min): 58 min   Short Term Goals: Week 3:  PT Short Term Goal 1 (Week 3): =LTG due to ELOS  Skilled Therapeutic Interventions/Progress Updates: pt presented in w/c agreeable to therapy. Pt denies pain but stating moderate fatigue from today's activities. Pt propelled w/c supervision to rehab gym. Pt participated in ankle pumps, LAQ, and standing hip flexion 2 x 10 for general strengthening and to "warm up" prior to stair training. Pt then participated in stair training ascending x 2 steps, then x 3 steps with L rail and PTA providing HHA on R side. Pt with poor clearance on last step with RLE attributed to fatigue. Pt then transported to day room and performed stand pivot transfer to [employer]. Participated in seated activity at 60cm/sec 10 cycles x 4 with brief rest between bouts. Pt attempted to perform STS from [facility] however limited by fatigue this session. Pt returned to w/c and transported back to room. Upon further discussion PTA [employer] imaged pt's home to attempt to count stairs, PTA and pt were able to successfully find image and noted 8 steps for entry and adv will provide info to primary therapist. Pt left in w/c at end of session with call bell within reach and needs met.      Therapy Documentation Precautions:  Precautions Precautions: Fall, Cervical Precaution Comments: reviewed cervical precautions Cervical Brace: Soft collar Restrictions Weight Bearing Restrictions: No General:   Vital Signs: Therapy Vitals Temp: 97.7 F (36.5 C) Temp Source: Oral Pulse Rate: 64 Resp: 14 BP: (!) 107/51 Patient Position (if appropriate): Sitting Oxygen Therapy SpO2: 97 % O2 Device: Room Air Pain: Pain Assessment Pain Scale: 0-10 Pain Score: 0-No pain Mobility:   Locomotion  :    Trunk/Postural Assessment :    Balance:   Exercises:   Other Treatments:      Therapy/Group: Individual Therapy  Vanderbilt Ranieri 10/05/2019, 3:50 PM

## 2019-10-05 NOTE — Plan of Care (Signed)
  Problem: Consults Goal: RH SPINAL CORD INJURY PATIENT EDUCATION Description:  See Patient Education module for education specifics.  Outcome: Progressing   Problem: SCI BOWEL ELIMINATION Goal: RH STG MANAGE BOWEL WITH ASSISTANCE Description: STG Manage Bowel with mod I Assistance. Outcome: Progressing Goal: RH STG SCI MANAGE BOWEL WITH MEDICATION WITH ASSISTANCE Description: STG SCI Manage bowel with medication with mod I assistance. Outcome: Progressing Goal: RH STG SCI MANAGE BOWEL PROGRAM W/ASSIST OR AS APPROPRIATE Description: STG SCI Manage bowel program with mod I assist or as appropriate. Outcome: Progressing   Problem: SCI BLADDER ELIMINATION Goal: RH OTHER STG BLADDER ELIMINATION GOALS W/ASSIST Description: Other STG Bladder Elimination Goals With mod I Assistance Outcome: Progressing   Problem: RH SAFETY Goal: RH STG ADHERE TO SAFETY PRECAUTIONS W/ASSISTANCE/DEVICE Description: STG Adhere to Safety Precautions With cues and reminders Outcome: Progressing   Problem: RH PAIN MANAGEMENT Goal: RH STG PAIN MANAGED AT OR BELOW PT'S PAIN GOAL Description: Pain level less than 4 on scale of 0-10 Outcome: Progressing   Problem: RH KNOWLEDGE DEFICIT SCI Goal: RH STG INCREASE KNOWLEDGE OF SELF CARE AFTER SCI Description: Pt will be able to demonstrate understanding of fall prevention and proper steps to call for help in case of emergency independently upon discharge. Pt will be able to identify and eliminate environmental hazards that can lead to pt's falling independently.  Outcome: Progressing

## 2019-10-06 ENCOUNTER — Inpatient Hospital Stay (HOSPITAL_COMMUNITY): Payer: Self-pay | Admitting: Physical Therapy

## 2019-10-06 ENCOUNTER — Inpatient Hospital Stay (HOSPITAL_COMMUNITY): Payer: Self-pay | Admitting: Occupational Therapy

## 2019-10-06 NOTE — Progress Notes (Signed)
PHYSICAL MEDICINE & REHABILITATION PROGRESS NOTE   Subjective/Complaints:  Pt reports hasn't put on TEDs on yet- bowels "angry" currently/this AM, but otherwise, doing well.   ROS: Patient denies fever, rash, sore throat, blurred vision, nausea, vomiting, (+) diarrhea, cough, shortness of breath or chest pain, joint or back pain, headache, or mood change.   Objective:   No results found. Recent Labs    10/04/19 0620  WBC 6.5  HGB 11.1*  HCT 34.6*  PLT 263   Recent Labs    10/04/19 0620  NA 140  K 3.9  CL 106  CO2 24  GLUCOSE 105*  BUN 13  CREATININE 0.67  CALCIUM 8.4*    Intake/Output Summary (Last 24 hours) at 10/06/2019 0845 Last data filed at 10/06/2019 0756 Gross per 24 hour  Intake 780 ml  Output -  Net 780 ml     Physical Exam: Vital Signs Blood pressure 130/66, pulse 68, temperature 98.2 F (36.8 C), resp. rate 19, height 5\' 6"  (1.676 m), weight 95.4 kg, SpO2 94 %.  Physical Exam Constitutional: No distress . Vital signs reviewed. Sitting up, in manual w/c, appropriate, smiling, PT put TEDs on; NAD  HEENT: EOMI, oral membranes moist Neck: supple Cardiovascular: RRR without murmur. No JVD    Respiratory: CTA Bilaterally without wheezes or rales. Normal effort    GI: BS +, non-tender, non-distended  Skin:, no dressing over the right anterior lower cervical incision  Neurologic: Decreased sensation to light touch on lateral aspect of R 4th digit and 5th digit c/w R ulnar neuropathy- not pinched nerve in neck  Right 5th digit in abducted position  Alert and oriented x3, motor strength is 3 - in the right deltoid 3 - in the biceps triceps finger flexors and extensors Left upper extremity is 4 - at the deltoid bicep tricep finger flexors and extensors 4/5 in the hip flexors knee extensors ankle dorsiflexors plantar flexors bilaterally  lower extremities---motor exam stable to improved Fine motor is reduced bilateral hands with finger to thumb  opposition. Musculoskeletal: good ROM, tr to 1+ pedal edema in AM-  Psych: pleasant and appropriate   Assessment/Plan: 1. Functional deficits secondary to Cervical myelopathy s/p C5/6 discectomy, C6-7 decompression and plates and screws arthrodesis  to  which require 3+ hours per day of interdisciplinary therapy in a comprehensive inpatient rehab setting.  Physiatrist is providing close team supervision and 24 hour management of active medical problems listed below.  Physiatrist and rehab team continue to assess barriers to discharge/monitor patient progress toward functional and medical goals  Care Tool:  Bathing    Body parts bathed by patient: Right arm, Left arm, Chest, Abdomen, Right upper leg, Left upper leg, Right lower leg, Left lower leg, Face, Front perineal area, Buttocks   Body parts bathed by helper: Buttocks     Bathing assist Assist Level: Supervision/Verbal cueing     Upper Body Dressing/Undressing Upper body dressing   What is the patient wearing?: Pull over shirt    Upper body assist Assist Level: Set up assist    Lower Body Dressing/Undressing Lower body dressing      What is the patient wearing?: Pants     Lower body assist Assist for lower body dressing: Contact Guard/Touching assist     Toileting Toileting    Toileting assist Assist for toileting: Moderate Assistance - Patient 50 - 74%     Transfers Chair/bed transfer  Transfers assist     Chair/bed transfer assist level: Supervision/Verbal  cueing     Locomotion Ambulation   Ambulation assist   Ambulation activity did not occur: Safety/medical concerns  Assist level: Supervision/Verbal cueing Assistive device: Walker-rolling Max distance: 100'   Walk 10 feet activity   Assist  Walk 10 feet activity did not occur: Safety/medical concerns  Assist level: Supervision/Verbal cueing Assistive device: Walker-rolling   Walk 50 feet activity   Assist Walk 50 feet with 2  turns activity did not occur: Safety/medical concerns  Assist level: Supervision/Verbal cueing Assistive device: Walker-rolling    Walk 150 feet activity   Assist Walk 150 feet activity did not occur: Safety/medical concerns         Walk 10 feet on uneven surface  activity   Assist Walk 10 feet on uneven surfaces activity did not occur: Safety/medical concerns         Wheelchair     Assist Will patient use wheelchair at discharge?: Yes Type of Wheelchair: Manual Wheelchair activity did not occur: Safety/medical concerns  Wheelchair assist level: Independent Max wheelchair distance: 150'    Wheelchair 50 feet with 2 turns activity    Assist    Wheelchair 50 feet with 2 turns activity did not occur: Safety/medical concerns   Assist Level: Independent   Wheelchair 150 feet activity     Assist  Wheelchair 150 feet activity did not occur: Safety/medical concerns   Assist Level: Independent   Blood pressure 130/66, pulse 68, temperature 98.2 F (36.8 C), resp. rate 19, height 5\' 6"  (1.676 m), weight 95.4 kg, SpO2 94 %.    Medical Problem List and Plan: 1.Right upper and lower extremity weaknesssecondary to cervical myelopathy.S/Pdiscectomy C5-6, 6-7 decompression placement of anterior instrumentation interbody plate and screws arthrodesis 09/09/2019. Cervical collar when out of bed -patient may shower -ELOS/Goals: 10-14d, supervision with mobility, min assist showering and lower extremity dressing mod I/supervision upper limb ADL  12/9- will order eucerin BID for anterior neck incision.  Improved participation?  12/16- doesn't need cervical collar 2. Antithrombotics: -DVT/anticoagulation:SCDs. Check vascular study 11/24- Dopplers (-)  11/25- started lovenox- OK'd by [facility].  -antiplatelet therapy: N/A 3. Pain Management:Oxycodone and Robaxin as needed  11/24- will start Gabapentin 300 mg BID for nerve  pain and titrate up as required  11/26- helping nerve pain  12/11- will increase gabapentin to 300 mg TID and add Duloxetine 30 mg daily for nerve pain- if tolerates, will increase Duloxetine.   12/13--gabapentin helping. She's willing to tolerate the mild nausea. Will keep crackers on hand for pm dose.  12/15- wait to increase Duloxetine for 7 days since had some nausea with it- go up on 12/18!  4. Mood:Provide emotional support -antipsychotic agents: N/A 5. Neuropsych: This patientiscapable of making decisions on herown behalf. 6. Skin/Wound Care:Routine skin checks 7. Fluids/Electrolytes/Nutrition:Routine in and outs with follow-up chemistries, eating 75-100% meals  8. 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 Flomax 0.4 mg daily. Check PVR  No ICP needed , ? Wean flomax after a couple weeks 9.  Neurogenic bowel.   Improved, no incont  No need for supp   On senna and docusate sodium, last recorded BM 12/4  12/7- said she had LBM in last 24 hours   12/12- bm today  12/16- BM regularly.  10. Hypokalemia  Resolved   11/27- K+ 4.2  12/1- K+ 4.0 11. R ulnar neuropathy  11/29- explained what it was- why she likely had it- to avoid compressing nerve by resting arm on W/C arm rest- will likely get better if doesn't  compress nerve.   12/6 intermittent, habitually leans on RIght elbow , + Wartenbergs sign Symptomatically doing better, may need OP EMG   13. Pedal edema  -likely multifactorial related to position, neurologic, pharm and nutritional factors   -encouraged elevation   -should wear KTH's throughout the day    LOS: 23 days A FACE TO FACE EVALUATION WAS PERFORMED  Sandra Parrish 10/06/2019, 8:45 AM

## 2019-10-06 NOTE — Plan of Care (Signed)
  Problem: Consults Goal: RH SPINAL CORD INJURY PATIENT EDUCATION Description:  See Patient Education module for education specifics.  Outcome: Progressing   Problem: SCI BOWEL ELIMINATION Goal: RH STG MANAGE BOWEL WITH ASSISTANCE Description: STG Manage Bowel with mod I Assistance. Outcome: Progressing Goal: RH STG SCI MANAGE BOWEL WITH MEDICATION WITH ASSISTANCE Description: STG SCI Manage bowel with medication with mod I assistance. Outcome: Progressing Goal: RH STG SCI MANAGE BOWEL PROGRAM W/ASSIST OR AS APPROPRIATE Description: STG SCI Manage bowel program with mod I assist or as appropriate. Outcome: Progressing   Problem: SCI BLADDER ELIMINATION Goal: RH OTHER STG BLADDER ELIMINATION GOALS W/ASSIST Description: Other STG Bladder Elimination Goals With mod I Assistance Outcome: Progressing   Problem: RH SAFETY Goal: RH STG ADHERE TO SAFETY PRECAUTIONS W/ASSISTANCE/DEVICE Description: STG Adhere to Safety Precautions With cues and reminders Outcome: Progressing   Problem: RH PAIN MANAGEMENT Goal: RH STG PAIN MANAGED AT OR BELOW PT'S PAIN GOAL Description: Pain level less than 4 on scale of 0-10 Outcome: Progressing   Problem: RH KNOWLEDGE DEFICIT SCI Goal: RH STG INCREASE KNOWLEDGE OF SELF CARE AFTER SCI Description: Pt will be able to demonstrate understanding of fall prevention and proper steps to call for help in case of emergency independently upon discharge. Pt will be able to identify and eliminate environmental hazards that can lead to pt's falling independently.  Outcome: Progressing   

## 2019-10-06 NOTE — Progress Notes (Signed)
Physical Therapy Session Note  Patient Details  Name: Sandra Parrish MRN: [id number] Date of Birth: 06/19/1946  Today's Date: 10/06/2019 PT Individual Time: 0800-0900; 1415-1530 PT Individual Time Calculation (min): 60 min and 75 min  Short Term Goals: Week 4:  PT Short Term Goal 1 (Week 4): =LTG due to ELOS  Skilled Therapeutic Interventions/Progress Updates:    Session 1: Pt received seated in w/c in room, agreeable to PT session. No complaints of pain at rest, does have onset of mid-thoracic pain with mobility, not rated. Sit to stand with Supervision to RW throughout session. Ambulation x 91' with RW and close Supervision, decreased stance time on RLE. Ambulation with RW across simulated threshold of hockey stick with Supervision cueing for safety and RW management. Pt reports her threshold to enter her apartment is more level than simulated threshold. Ascend/descend one 4" curb and one 6" curb with RW and min A for balance, v/c and demonstration of safe curb navigation technique. Pt exhibits some anxiety with curb navigation but performs it well with good balance and RW management. Also demonstrated how to bump w/c up/down curb via assist from a caregiver. Attempted to have pt doff TED hose while seated in w/c with use of reacher, pt unable to lean forwards far enough to remove TEDs and unable to doff with use of reacher. Pt is dependent to doff TEDS. Attempted to have pt don TED hose with use of TED hose plastic bag over the end of her foot, pt again unable to reach down to reach her feet in order to don TEDs. Will continue to problem solve with OT to determine best method for donning and doffing compression stockings. Provided BLE elevating leg rests for BLE edema management. Pt left seated in w/c in room with needs in reach at end of session.  Session 2: Pt received seated in w/c in room, agreeable to PT session. No complaints of pain. Discussed pt's home laundry setup and how to maneuver  her dirty clothing to/from her washer and dryer. Pt is able to gather clothing items from around her room independently and place them into a plastic bag. Pt is able to perform w/c mobility while balancing bag of clothing items at mod I level. Provided pt with laundry basket to simulate home environment. Pt is able to propel manual w/c x 100 ft with use of BUE while balancing clothing in basket on her lap. Pt is able to stand up to washing machine and dryer with RW at Supervision level to reach controls. Per pt her washer and dryer at home are front-loading so she will be able to roll w/c up to them in order to retrieve clothing items. Discussed placing a RW at her washer/dryer vs standing up to washer and dryer themselves. Reviewed stair management techniques. Pt is able to ascend/descend 8 x 6" stairs with use of either L or R handrail and min HHA, step to gait pattern with seated rest break after 4 stairs. Discussed placement of RW and w/c for safe maneuvering on landings as well. Pt has increase in anxiety while performing stairs either due to height or fear of falling, provided emotional support and encouragement. Manual w/c propulsion x 150 ft with use of BUE at mod I level in w/c pt will d/c home in. Pt reports her new w/c is easier to propel and she is able to maneuver parts independently. Pt left seated in w/c in room with needs in reach at end of session. Cotreatment session  with recreational therapy.   Therapy Documentation Precautions:  Precautions Precautions: Fall, Cervical Precaution Comments: reviewed cervical precautions Cervical Brace: Soft collar Restrictions Weight Bearing Restrictions: No    Therapy/Group: Individual Therapy   Peter Congo, PT, DPT  10/06/2019, 12:32 PM

## 2019-10-06 NOTE — Discharge Summary (Signed)
Physician Discharge Summary  Patient ID: Sandra Parrish MRN: [id number] DOB/AGE: 02-23-1946 73 y.o.  Admit date: 09/13/2019 Discharge date: 10/08/2019  Discharge Diagnoses:  Principal Problem:   Cervical myelopathy [facility](HCC) Active Problems:   Cervical spinal stenosis   Urinary retention   Drug induced constipation   Neurogenic bladder DVT prophylaxis Pain management  Discharged Condition: Stable  Significant Diagnostic Studies: DG Cervical Spine 2-3 Views  Result Date: 09/09/2019 CLINICAL DATA:  Cervical spine fusion EXAM: CERVICAL SPINE - 2-3 VIEW COMPARISON:  None. FINDINGS: Two lateral intraoperative radiographs are available. There is multilevel degenerative disc disease with disc height loss, endplate osteophytes, and endplate irregularity. Multilevel facet hypertrophy is noted. On the second image, there has been anterior fusion at C5-C7 with plate and screw fixation and interbody grafts. IMPRESSION: Post ACDF at C5-C7. Electronically Signed   By: Guadlupe SpanishPraneil  Patel M.D.   On: 09/09/2019 10:18   DG C-Arm 1-60 Min  Result Date: 09/09/2019 CLINICAL DATA:  Anterior cervical fusion EXAM: DG C-ARM 1-60 MIN FLUOROSCOPY TIME:  Fluoroscopy Time:  6 seconds COMPARISON:  None. FINDINGS: Two lateral intraoperative radiographs are submitted. Multilevel spondylosis is present with disc height loss, endplate osteophytes, endplate irregularity, and facet hypertrophy. On the second image, there has been anterior fusion at C5-C7 with plate and screw fixation and interbody grafts. IMPRESSION: Fluoroscopic guidance for ACDF at C5-C7. See operative note for additional details. Electronically Signed   By: Guadlupe SpanishPraneil  Patel M.D.   On: 09/09/2019 10:19   VAS US LOWER EXTREMITY VENOUS (DVT)  Result Date: 09/14/2019  Lower Venous Study Indications: Swelling.  Comparison Study: no prior Performing Technologist: Blanch MediaMegan Riddle RVS  Examination Guidelines: A complete evaluation includes B-mode imaging, spectral  Doppler, color Doppler, and power Doppler as needed of all accessible portions of each vessel. Bilateral testing is considered an integral part of a complete examination. Limited examinations for reoccurring indications may be performed as noted.  +---------+---------------+---------+-----------+----------+--------------+ RIGHT    CompressibilityPhasicitySpontaneityPropertiesThrombus Aging +---------+---------------+---------+-----------+----------+--------------+ CFV      Full           Yes      Yes                                 +---------+---------------+---------+-----------+----------+--------------+ SFJ      Full                                                        +---------+---------------+---------+-----------+----------+--------------+ FV Prox  Full                                                        +---------+---------------+---------+-----------+----------+--------------+ FV Mid   Full                                                        +---------+---------------+---------+-----------+----------+--------------+ FV DistalFull                                                        +---------+---------------+---------+-----------+----------+--------------+  PFV      Full                                                        +---------+---------------+---------+-----------+----------+--------------+ POP      Full           Yes      Yes                                 +---------+---------------+---------+-----------+----------+--------------+ PTV      Full                                                        +---------+---------------+---------+-----------+----------+--------------+ PERO     Full                                                        +---------+---------------+---------+-----------+----------+--------------+   +---------+---------------+---------+-----------+----------+--------------+ LEFT      CompressibilityPhasicitySpontaneityPropertiesThrombus Aging +---------+---------------+---------+-----------+----------+--------------+ CFV      Full           Yes      Yes                                 +---------+---------------+---------+-----------+----------+--------------+ SFJ      Full                                                        +---------+---------------+---------+-----------+----------+--------------+ FV Prox  Full                                                        +---------+---------------+---------+-----------+----------+--------------+ FV Mid   Full                                                        +---------+---------------+---------+-----------+----------+--------------+ FV DistalFull                                                        +---------+---------------+---------+-----------+----------+--------------+ PFV      Full                                                        +---------+---------------+---------+-----------+----------+--------------+  POP      Full           Yes      Yes                                 +---------+---------------+---------+-----------+----------+--------------+ PTV      Full                                                        +---------+---------------+---------+-----------+----------+--------------+ PERO     Full                                                        +---------+---------------+---------+-----------+----------+--------------+     Summary: Right: There is no evidence of deep vein thrombosis in the lower extremity. No cystic structure found in the popliteal fossa. Left: There is no evidence of deep vein thrombosis in the lower extremity. No cystic structure found in the popliteal fossa.  *See table(s) above for measurements and observations. Electronically signed by Waverly Ferrari MD on 09/14/2019 at 8:33:10 AM.    Final     Labs:  Basic Metabolic  Panel: Recent Labs  Lab 10/04/19 0620  NA 140  K 3.9  CL 106  CO2 24  GLUCOSE 105*  BUN 13  CREATININE 0.67  CALCIUM 8.4*    CBC: Recent Labs  Lab 10/04/19 0620  WBC 6.5  NEUTROABS 4.0  HGB 11.1*  HCT 34.6*  MCV 95.3  PLT 263    CBG: No results for input(s): GLUCAP in the last 168 hours.  Family history.  Mother and father hypertension hyperlipidemia.  Denies diabetes mellitus or colon cancer or rectal cancer  Brief HPI:   Sandra Parrish is a 73 y.o. right-handed female with unremarkable past medical history.  Per chart review lives alone independent prior to admission.  She has no family in the immediate area.  Presented 09/07/2019 after a fall without loss of consciousness and progressive right upper extremity and right lower extremity weakness with tingling.  Admission chemistries unremarkable.  Cranial CT scan showed no acute process.  There was a midline frontal scalp swelling contusion with small hematoma measuring 4 mm in maximal thickness.  CT MRI cervical spine showed abnormal cord edema versus myelomalacia extending from C4-5 through the mid C7 level associated with severe central narrowing of the thecal sac at C5-6 and moderate central narrowing C6-7 with myelopathy.  Underwent discectomy C5-6 and 6-7 for decompression of spinal cord and existing nerve root placement of anterior instrumentation consisting of interbody plate and screws arthrodesis 09/09/2019 per Dr. Conchita Paris.  Hospital course pain management as well as urinary retention with a Foley catheter tube initially in place maintained on Flomax.  Cervical collar when out of bed.  Patient was admitted for a comprehensive rehab program   Hospital Course: [place] was admitted to rehab 09/13/2019 for inpatient therapies to consist of PT, ST and OT at least three hours five days a week. Past admission physiatrist, therapy team and rehab RN have worked together to provide customized collaborative inpatient rehab.   Pertaining to patient's cervical myelopathy status  post discectomy C5-6 and 6-7 decompression per neurosurgery surgical site well-healed cervical collar when out of bed.  Venous Doppler studies negative for DVT patient was maintained on Lovenox after cleared by neurosurgery.  Pain managed use of oxycodone and Robaxin as needed.  Placed on gabapentin titrated as needed as well as the addition of Cymbalta titrated from 30 to 60 mg.  Urinary retention due to neurogenic bladder Flomax as indicated no ICP needed she would be weaned from Flomax over the upcoming weeks.  Neurogenic bowel no incontinence no need for suppository.  Right ulnar neuropathy avoid compressing nerve by resting arm on wheelchair armrest.  Pedal edema support hose as indicated.   Blood pressures were monitored on TID basis and controlled  Sandra Parrish is continent of bowel and bladder.  Sandra Parrish has made gains during rehab stay and is attending therapies  Sandra Parrish will continue to receive follow up therapies after discharge  Rehab course: During patient's stay in rehab weekly team conferences were held to monitor patient's progress, set goals and discuss barriers to discharge. At admission, patient required moderate assist sit to stand, moderate assist stand pivot transfers, mod max assist lateral scoot transfers, minimal assist sit to supine.  Set up upper body bathing max is lower body bathing set up upper body dressing max is lower body dressing  Physical exam.  Blood pressure 109/75 pulse 75 temperature 97.6 respirations 16 oxygen saturations 95% room air Neurological.  Alert sitting up in chair follows commands no acute distress mood and affect HEENT Eyes.  Pupils round and reactive to light no nystagmus Neck.  Supple nontender cervical collar in place Cardiac regular rate rhythm without extra sounds murmur GI.  Soft nontender positive bowel sounds Neurological.  Left upper extremity 4 - at the deltoid bicep tricep finger flexors and  extensors 4 out of 5 hip flexors knee extensors ankle dorsiflexors plantar flexors bilateral lower extremities.  Fine motor skills reduced.  Sandra Parrish  has had improvement in activity tolerance, balance, postural control as well as ability to compensate for deficits. Sandra Parrish has had improvement in functional use RUE/LUE  and RLE/LLE as well as improvement in awareness.  Working with energy conservation techniques.  Ambulating 55 feet rolling walker close supervision.  Ambulates with rolling walker across simulated thresholds.  Supervision cueing for safety and rolling walker management.  Up and down 4 inch curbs and 6 inch curbs rolling walker minimal assistance.  Demonstrated how to bump wheelchair up and down curb.  Modified independent for ADLs.  Ambulates to the bathroom with close supervision.  Toilet transfers with supervision bedside commode.  Family teaching completed plan discharge to home       Disposition: Discharge disposition: 01-Home or Self Care     Discharge to home   Diet: Regular  Special Instructions: No driving smoking or alcohol  Cervical collar when out of bed  Medications at discharge 1.  Tylenol as needed 2.  Colace 100 mg p.o. twice daily 3.  Cymbalta 60 mg nightly 4.  Neurontin 300 mg p.o. 3 times daily 5.  Robaxin 500 mg every 6 hours as needed muscle spasms 6.  Oxycodone 5 to 10 mg every 3 hours as needed pain 7.  Flomax 0.8 mg p.o. daily after supper with slow taper 8.  Dulcolax suppository daily as needed 9.  Colace 100 mg p.o. twice daily as needed 10.  MiraLAX p.o. daily as needed  Discharge Instructions    Ambulatory referral to Physical Medicine Rehab   Complete by:  As directed    Moderate complexity follow-up 1 to 2 weeks cervical myelopathy      Follow-up Information    Lovorn, Aundra Millet, MD Follow up.   Specialty: Physical Medicine and Rehabilitation Why: Office to call for appointment Contact information: [address] [place]  [place] [address] [phone]        Lisbeth Renshaw, MD Follow up.   Specialty: Neurosurgery Why: Call for appointment Contact information: [address] [place] [address] [phone]           Signed: Mcarthur Rossetti Omie Ferger 10/08/2019, 5:19 AM

## 2019-10-06 NOTE — Progress Notes (Signed)
Recreational Therapy Session Note  Patient Details  Name: Ardith Test MRN: [id number] Date of Birth: 06/13/1946 Today's Date: 10/06/2019 Time:  7829-5621 Pain: no c/o Skilled Therapeutic Interventions/Progress Updates: Session focused on discharge planning. Pt transferred to her w/c with supervision using RW and propelled w/c throughout the unit with supervision using BUEs.  Problem solved through laundry task simulating her home laundry set up with supervision/cues.  Discussed use of leisure time with emphasis on seated tasks for enjoyment and safety/energy conservation.  Pt stated understanding.    [place] 10/06/2019, 3:52 PM

## 2019-10-06 NOTE — Progress Notes (Signed)
Occupational Therapy Session Note  Patient Details  Name: Sandra Parrish MRN: [id number] Date of Birth: [birth year]  Today's Date: 10/06/2019 OT Individual Time: 1000-1100 OT Individual Time Calculation (min): 60 min    Short Term Goals: Week 3:  OT Short Term Goal 1 (Week 3): STG=LTG due to LOS  Skilled Therapeutic Interventions/Progress Updates:    Pt seen for OT session focusing on problem solving of compression garment donning techniques and d/c planning. Pt siting up in w/c upon arrival, agreeable to tx session and denying pain. Discussed at length d/c planning and therapist's concerns with pt's ability to complete family education and d/c home on the same day due to fatigue. Pt is becoming more anxious with d/c, still wanting to go home on targeted d/c, however, friends who stated they would provide assist at d/c are now not available. CSW made aware and pt to talk with friends. OT provided education on OT/PT concerns with d/c plan currently.  MD requesting pt wear TED hose for edema management at d/c. Pt to be mod I at home with very intermittent assist from friends. Therefore, addressed donning/doffing positioning of TED hose. Attempted from w/c and EOB. Pt most successful sitting EOB with foot propped on small step. She was unable to reach past her ankle however in order to completely doff TED or to begin threading it on. Education provided regarding option of ACE wrapping in figure 8 for edema management. Pt was more successful with this method, however, will cont to need practice. Spoke with MD regarding if strict LE elevation schedule made for d/c if pt could get by without compression, awaiting response. Pt ambulated into bathroom with close supervision using RW. Toilet transfer with supervision with BSC over toilet.  Pt returned to w/c at end of session, left with all needs in reach.   Therapy Documentation Precautions:  Precautions Precautions: Fall, Cervical Precaution  Comments: reviewed cervical precautions Cervical Brace: Soft collar Restrictions Weight Bearing Restrictions: No   Therapy/Group: Individual Therapy  Dilyn Osoria L 10/06/2019, 7:02 AM

## 2019-10-07 ENCOUNTER — Inpatient Hospital Stay (HOSPITAL_COMMUNITY): Payer: Self-pay | Admitting: Physical Therapy

## 2019-10-07 ENCOUNTER — Inpatient Hospital Stay (HOSPITAL_COMMUNITY): Payer: Self-pay | Admitting: Occupational Therapy

## 2019-10-07 MED ORDER — ACETAMINOPHEN 325 MG PO TABS: 650.0000 mg | ORAL_TABLET | ORAL | Status: AC | PRN

## 2019-10-07 MED ORDER — OXYCODONE HCL 5 MG PO TABS: 5.0000 mg | ORAL_TABLET | ORAL | 0 refills | Status: AC | PRN

## 2019-10-07 MED ORDER — TAMSULOSIN HCL 0.4 MG PO CAPS: ORAL_CAPSULE | ORAL | 0 refills | Status: AC

## 2019-10-07 MED ORDER — GABAPENTIN 300 MG PO CAPS: 300.0000 mg | ORAL_CAPSULE | Freq: Three times a day (TID) | ORAL | 1 refills | Status: AC

## 2019-10-07 MED ORDER — DOCUSATE SODIUM 100 MG PO CAPS: 100.0000 mg | ORAL_CAPSULE | Freq: Two times a day (BID) | ORAL | 0 refills | Status: AC

## 2019-10-07 MED ORDER — METHOCARBAMOL 500 MG PO TABS: 500.0000 mg | ORAL_TABLET | Freq: Four times a day (QID) | ORAL | 0 refills | Status: AC | PRN

## 2019-10-07 MED ORDER — DULOXETINE HCL 60 MG PO CPEP: 60.0000 mg | ORAL_CAPSULE | Freq: Every day | ORAL | 3 refills | Status: AC

## 2019-10-07 MED ORDER — POLYETHYLENE GLYCOL 3350 17 G PO PACK: 17.0000 g | PACK | Freq: Every day | ORAL | 0 refills | Status: AC | PRN

## 2019-10-07 MED ORDER — BISACODYL 10 MG RE SUPP: 10.0000 mg | Freq: Every day | RECTAL | 0 refills | Status: AC | PRN

## 2019-10-07 NOTE — Progress Notes (Signed)
Pt slept throughout night after being medicated for back pain/spasm

## 2019-10-07 NOTE — Progress Notes (Signed)
Occupational Therapy Session Note  Patient Details  Name: Sandra Parrish MRN: [id number] Date of Birth: 01-03-1946  Today's Date: 10/07/2019 OT Individual Time: 5038-8828 OT Individual Time Calculation (min): 60 min    Short Term Goals: Week 3:  OT Short Term Goal 1 (Week 3): STG=LTG due to LOS  Skilled Therapeutic Interventions/Progress Updates:    Patient seated in w/c, ready for therapy session.  SPT with RW to/from w/c and mat with S.  She is able to propel w/c > 100 feet.  She completed 60 foot walk with RW CS level - fatigue and mild LOB at end of walk with min A to correct - reviewed need to stay at w/c level for HM/daily tasks at home to ensure safety - she demonstrates good understanding.  Reviewed daily routine and assistance needs from friends and neighbors.  She is able to manage w/c parts independently and reach to level surfaces for cat care and basic HM.  Completed seated core and proximal mobility activities with good awareness and follows back/cervical precautions.  OT HEP program provided - she demonstrates good understanding.   Reviewed posture and positioning for activities in w/c and for tasks such as computer work and cooking.   UB coordination:  Box and blocks:  R = 55, L = 46,    9 hole peg:  R = 27 sec, L = 28 sec Grip strength:  R = 10#, L = 15# She remained seated in w/c at close of session, call bell in reach.    Therapy Documentation Precautions:  Precautions Precautions: Fall, Cervical Precaution Comments: reviewed cervical precautions Cervical Brace: Soft collar Restrictions Weight Bearing Restrictions: No General:   Vital Signs: Therapy Vitals Temp: 98.1 F (36.7 C) Pulse Rate: 65 Resp: 20 BP: 136/65 Patient Position (if appropriate): Sitting Oxygen Therapy SpO2: 97 % O2 Device: Room Air Pain: Pain Assessment Pain Scale: 0-10 Pain Score: 0-No pain   Other Treatments:     Therapy/Group: Individual Therapy  Carlos Levering 10/07/2019,  3:39 PM

## 2019-10-07 NOTE — Discharge Instructions (Signed)
Inpatient Rehab Discharge Instructions  [facility] Discharge date and time: No discharge date for patient encounter.   Activities/Precautions/ Functional Status: Activity: Cervical collar when out of bed Diet: regular diet Wound Care: keep wound clean and dry Functional status:  ___ No restrictions     ___ Walk up steps independently ___ 24/7 supervision/assistance   ___ Walk up steps with assistance ___ Intermittent supervision/assistance  ___ Bathe/dress independently ___ Walk with walker     _x__ Bathe/dress with assistance ___ Walk Independently    ___ Shower independently ___ Walk with assistance    ___ Shower with assistance ___ No alcohol     ___ Return to work/school ________    COMMUNITY REFERRALS UPON DISCHARGE:    Home Health:   PT    OT                      Agency:  [facility]        Phone:  [phone]   Medical Equipment/Items Ordered:  Wheelchair, walker, tub bench                                                      Agency/Supplier:  [facility] [phone]      Special Instructions: No driving smoking or alcohol   My questions have been answered and I understand these instructions. I will adhere to these goals and the provided educational materials after my discharge from the hospital.  Patient/Caregiver Signature _______________________________ Date __________  Clinician Signature _______________________________________ Date __________  Please bring this form and your medication list with you to all your follow-up doctor's appointments.

## 2019-10-07 NOTE — Progress Notes (Signed)
[facility] PHYSICAL MEDICINE & REHABILITATION PROGRESS NOTE   Subjective/Complaints: Continues to have band-like pain across abdomen. Shares that Dr. Berline Chough increased dose of neuropathic agent to help with this. She would not like a further dose increase at this time. Having regular BM. Right ulnar neuropathy symptoms much improved.   ROS: Patient denies fever, rash, sore throat, blurred vision, nausea, vomiting, (+) diarrhea, cough, shortness of breath or chest pain, joint or back pain, headache, or mood change.   Objective:   No results found. No results for input(s): WBC, HGB, HCT, PLT in the last 72 hours. No results for input(s): NA, K, CL, CO2, GLUCOSE, BUN, CREATININE, CALCIUM in the last 72 hours.  Intake/Output Summary (Last 24 hours) at 10/07/2019 1139 Last data filed at 10/07/2019 0757 Gross per 24 hour  Intake 700 ml  Output --  Net 700 ml     Physical Exam: Vital Signs Blood pressure 136/69, pulse 69, temperature 98.4 F (36.9 C), resp. rate 17, height 5\' 6"  (1.676 m), weight 95.4 kg, SpO2 95 %.  Physical Exam Constitutional: No distress . Vital signs reviewed. Sitting up, in manual w/c, appropriate, smiling, PT put TEDs on; NAD  HEENT: EOMI, oral membranes moist Neck: supple Cardiovascular: RRR without murmur. No JVD    Respiratory: CTA Bilaterally without wheezes or rales. Normal effort    GI: BS +, non-tender, non-distended  Skin:, no dressing over the right anterior lower cervical incision  Neurologic: Decreased sensation to light touch on lateral aspect of R 4th digit and 5th digit c/w R ulnar neuropathy- not pinched nerve in neck. Right hand grip strength improved.  Right 5th digit in abducted position  Alert and oriented x3, motor strength is 3 - in the right deltoid 3 - in the biceps triceps finger flexors and extensors Left upper extremity is 4 - at the deltoid bicep tricep finger flexors and extensors 4/5 in the hip flexors knee extensors ankle  dorsiflexors plantar flexors bilaterally  lower extremities---motor exam stable to improved Fine motor is reduced bilateral hands with finger to thumb opposition. Musculoskeletal: good ROM, tr to 1+ pedal edema in AM-  Psych: pleasant and appropriate  Assessment/Plan: 1. Functional deficits secondary to Cervical myelopathy s/p C5/6 discectomy, C6-7 decompression and plates and screws arthrodesis  to  which require 3+ hours per day of interdisciplinary therapy in a comprehensive inpatient rehab setting.  Physiatrist is providing close team supervision and 24 hour management of active medical problems listed below.  Physiatrist and rehab team continue to assess barriers to discharge/monitor patient progress toward functional and medical goals  Care Tool:  Bathing    Body parts bathed by patient: Right arm, Left arm, Chest, Abdomen, Right upper leg, Left upper leg, Right lower leg, Left lower leg, Face, Front perineal area, Buttocks   Body parts bathed by helper: Buttocks     Bathing assist Assist Level: Supervision/Verbal cueing     Upper Body Dressing/Undressing Upper body dressing   What is the patient wearing?: Pull over shirt    Upper body assist Assist Level: Set up assist    Lower Body Dressing/Undressing Lower body dressing      What is the patient wearing?: Pants     Lower body assist Assist for lower body dressing: Contact Guard/Touching assist     Toileting Toileting    Toileting assist Assist for toileting: Moderate Assistance - Patient 50 - 74%     Transfers Chair/bed transfer  Transfers assist     Chair/bed transfer  assist level: Supervision/Verbal cueing     Locomotion Ambulation   Ambulation assist   Ambulation activity did not occur: Safety/medical concerns  Assist level: Supervision/Verbal cueing Assistive device: Walker-rolling Max distance: 57'   Walk 10 feet activity   Assist  Walk 10 feet activity did not occur:  Safety/medical concerns  Assist level: Supervision/Verbal cueing Assistive device: Walker-rolling   Walk 50 feet activity   Assist Walk 50 feet with 2 turns activity did not occur: Safety/medical concerns  Assist level: Supervision/Verbal cueing Assistive device: Walker-rolling    Walk 150 feet activity   Assist Walk 150 feet activity did not occur: Safety/medical concerns         Walk 10 feet on uneven surface  activity   Assist Walk 10 feet on uneven surfaces activity did not occur: Safety/medical concerns         Wheelchair     Assist Will patient use wheelchair at discharge?: Yes Type of Wheelchair: Manual Wheelchair activity did not occur: Safety/medical concerns  Wheelchair assist level: Independent Max wheelchair distance: 150'    Wheelchair 50 feet with 2 turns activity    Assist    Wheelchair 50 feet with 2 turns activity did not occur: Safety/medical concerns   Assist Level: Independent   Wheelchair 150 feet activity     Assist  Wheelchair 150 feet activity did not occur: Safety/medical concerns   Assist Level: Independent   Blood pressure 136/69, pulse 69, temperature 98.4 F (36.9 C), resp. rate 17, height 5\' 6"  (1.676 m), weight 95.4 kg, SpO2 95 %.    Medical Problem List and Plan: 1.Right upper and lower extremity weaknesssecondary to cervical myelopathy.S/Pdiscectomy C5-6, 6-7 decompression placement of anterior instrumentation interbody plate and screws arthrodesis 09/09/2019. Cervical collar when out of bed -patient may shower -ELOS/Goals: 10-14d, supervision with mobility, min assist showering and lower extremity dressing mod I/supervision upper limb ADL  12/9- will order eucerin BID for anterior neck incision.  Improved participation?  12/16- doesn't need cervical collar 2. Antithrombotics: -DVT/anticoagulation:SCDs. Check vascular study 11/24- Dopplers (-)  11/25- started lovenox-  OK'd by [facility].  -antiplatelet therapy: N/A 3. Pain Management:Oxycodone and Robaxin as needed  11/24- will start Gabapentin 300 mg BID for nerve pain and titrate up as required  11/26- helping nerve pain  12/11- will increase gabapentin to 300 mg TID and add Duloxetine 30 mg daily for nerve pain- if tolerates, will increase Duloxetine.   12/13--gabapentin helping. She's willing to tolerate the mild nausea. Will keep crackers on hand for pm dose.  12/15- wait to increase Duloxetine for 7 days since had some nausea with it- go up on 12/18!  4. Mood:Provide emotional support -antipsychotic agents: N/A 5. Neuropsych: This patientiscapable of making decisions on herown behalf. 6. Skin/Wound Care:Routine skin checks 7. Fluids/Electrolytes/Nutrition:Routine in and outs with follow-up chemistries, eating 75-100% meals  8. 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 Flomax 0.4 mg daily. Check PVR  No ICP needed , ? Wean flomax after a couple weeks 9.  Neurogenic bowel.   Improved, no incont  No need for supp   On senna and docusate sodium, last recorded BM 12/4  12/7- said she had LBM in last 24 hours   12/12- bm today  12/16-12/17: BM regularly.  10. Hypokalemia  Resolved   11/27- K+ 4.2  12/1- K+ 4.0 11. R ulnar neuropathy  11/29- explained what it was- why she likely had it- to avoid compressing nerve by resting arm on W/C arm rest- will likely get  better if doesn't compress nerve.   12/6 intermittent, habitually leans on RIght elbow , + Wartenbergs sign Symptomatically doing better, may need OP EMG   13. Pedal edema  -likely multifactorial related to position, neurologic, pharm and nutritional factors   -encouraged elevation   -should wear KTH's throughout the day  LOS: 24 days A FACE TO FACE EVALUATION WAS PERFORMED  Clint BolderKrutika P Megin Consalvo 10/07/2019, 11:39 AM

## 2019-10-07 NOTE — Progress Notes (Signed)
Physical Therapy Session Note  Patient Details  Name: Sandra Parrish MRN: [id number] Date of Birth: 01/08/1946  Today's Date: 10/07/2019 PT Individual Time: 0800-0900; 1300-1345 PT Individual Time Calculation (min): 60 min and 45 min  Short Term Goals: Week 4:  PT Short Term Goal 1 (Week 4): =LTG due to ELOS  Skilled Therapeutic Interventions/Progress Updates:    Session 1: Pt received seated in w/c in room, agreeable to PT session. Pt reports mid-thoracic pain with activity, not rated. Sit to stand with Supervision to RW or stair rails throughout session. Ascend/descend 8 stairs (6") with use of R or L handrail and min HHA. Pt exhibits onset of fatigue when performing second set of stairs, needs increased time and encouragement to complete. Pt again exhibits anxiety with stairs, provided encouragement and emotional support. Manual w/c propulsion 2 x 150 ft with use of BUE at mod I level. Pt is independent for management of w/c parts. Reviewed HEP handout provided to patient with supine and seated BLE strengthening therex: heel slides, hip abd, SAQ, glute sets, seated marches, LAQ, hip add squeeze x 10 reps each to tolerance. Pt left seated in w/c in room with needs in reach at end of session.  Session 2: Pt received seated in w/c in room, agreeable to PT session. Manual w/c propulsion 2 x 150 ft at mod I level with use of BUE. Sit to stand with Supervision to RW. Pt engages in BUE fine motor task with no UE support and SBA for balance while creating Christmas tree made of ribbons. Pt is able to tolerate standing x 10 min while engaging in fine motor task. Standing balance with one UE support on RW while playing horseshoes, 4 x 6 throws with SBA for balance. Pt left seated in w/c in room with needs in reach at end of session. Cotreatment session with recreational therapy.   Therapy Documentation Precautions:  Precautions Precautions: Fall, Cervical Precaution Comments: reviewed cervical  precautions Cervical Brace: Soft collar Restrictions Weight Bearing Restrictions: No    Therapy/Group: Individual Therapy   Excell Seltzer, PT, DPT  10/07/2019, 12:32 PM

## 2019-10-07 NOTE — Plan of Care (Signed)
  Problem: Consults Goal: RH SPINAL CORD INJURY PATIENT EDUCATION Description:  See Patient Education module for education specifics.  Outcome: Progressing   Problem: SCI BOWEL ELIMINATION Goal: RH STG MANAGE BOWEL WITH ASSISTANCE Description: STG Manage Bowel with mod I Assistance. Outcome: Progressing Goal: RH STG SCI MANAGE BOWEL WITH MEDICATION WITH ASSISTANCE Description: STG SCI Manage bowel with medication with mod I assistance. Outcome: Progressing Goal: RH STG SCI MANAGE BOWEL PROGRAM W/ASSIST OR AS APPROPRIATE Description: STG SCI Manage bowel program with mod I assist or as appropriate. Outcome: Progressing   Problem: SCI BLADDER ELIMINATION Goal: RH OTHER STG BLADDER ELIMINATION GOALS W/ASSIST Description: Other STG Bladder Elimination Goals With mod I Assistance Outcome: Progressing   Problem: RH SAFETY Goal: RH STG ADHERE TO SAFETY PRECAUTIONS W/ASSISTANCE/DEVICE Description: STG Adhere to Safety Precautions With cues and reminders Outcome: Progressing   Problem: RH KNOWLEDGE DEFICIT SCI Goal: RH STG INCREASE KNOWLEDGE OF SELF CARE AFTER SCI Description: Pt will be able to demonstrate understanding of fall prevention and proper steps to call for help in case of emergency independently upon discharge. Pt will be able to identify and eliminate environmental hazards that can lead to pt's falling independently.  Outcome: Progressing   Problem: RH PAIN MANAGEMENT Goal: RH STG PAIN MANAGED AT OR BELOW PT'S PAIN GOAL Description: Pain level less than 4 on scale of 0-10 Outcome: Progressing

## 2019-10-07 NOTE — Progress Notes (Signed)
Recreational Therapy Session Note  Patient Details  Name: Sandra Parrish MRN: [id number] Date of Birth: 07/08/1946 Today's Date: 10/07/2019 Time:  5670-1410 Pain: no c/o Skilled Therapeutic Interventions/Progress Updates: Pt propelled w/c on the unit with supervision.  Session focused on activity tolerance, standing tolerance, UE use.  Pt performed sit-stand at hi-low table for tabletop craft requiring use of BUE for fine motor/coordination tasks with supervision.   Therapy/Group: Co-Treatment  Jeniece Hannis 10/07/2019, 3:08 PM

## 2019-10-08 ENCOUNTER — Ambulatory Visit (HOSPITAL_COMMUNITY): Payer: Self-pay | Admitting: Physical Therapy

## 2019-10-08 ENCOUNTER — Encounter (HOSPITAL_COMMUNITY): Payer: Self-pay | Admitting: Occupational Therapy

## 2019-10-08 MED ORDER — GABAPENTIN 400 MG PO CAPS: 400.0000 mg | ORAL_CAPSULE | Freq: Three times a day (TID) | ORAL | Status: DC

## 2019-10-08 NOTE — Progress Notes (Signed)
Pt discharged to home with friend at bedside. Pt received discharge instructions and was transferred downstairs with therapy.

## 2019-10-08 NOTE — Progress Notes (Signed)
Recreational Therapy Discharge Summary Patient Details  Name: Sandra Parrish MRN: [id number] Date of Birth: January 05, 1946 Today's Date: 10/08/2019  Long term goals set: 1  Long term goals met: 1  Comments on progress toward goals: Pt has made good progress during LOS.  TR focus has been on patient education in regards to lesiure time, activity modifications and safety.  Pt met supervision level for simple standing TR tasks and is Mod I for seated tasks.  Pt is discharging home today with intermittent assistance from friends.  Reasons for discharge: discharge from hospital  Patient/family agrees with progress made and goals achieved: Yes  Loralye Loberg 10/08/2019, 9:40 AM

## 2019-10-08 NOTE — Progress Notes (Signed)
Physical Therapy Discharge Summary  Patient Details  Name: Sandra Parrish MRN: [id number] Date of Birth: Mar 06, 1946  Today's Date: 10/08/2019 PT Individual Time: 0354-6568 PT Individual Time Calculation (min): 55 min    Patient has met 9 of 9 long term goals due to improved activity tolerance, improved balance, improved postural control, increased strength, increased range of motion and ability to compensate for deficits.  Patient to discharge at a wheelchair level Modified Independent.   Patient's care partner is independent to provide the necessary physical assistance at discharge. Pt's friend [name] has completed hands-on training in order to assist pt on how to navigate stairs as pt does require min A with these. Both patient and her friend feel confident following education with performing stairs at home.  Reasons goals not met: Patient has met all rehab goals.  Recommendation:  Patient will benefit from ongoing skilled PT services in home health setting to continue to advance safe functional mobility, address ongoing impairments in balance, endurance, strength, safety, independence with functional mobility, and minimize fall risk.  Equipment: 20x18 manual w/c; RW  Reasons for discharge: treatment goals met and discharge from hospital  Patient/family agrees with progress made and goals achieved: Yes   Skilled Intervention: Pt received seated in hallway handed off from OT session with friend [name] present for hands-on family education session. Demonstrated that pt is at mod I level for sit to stand transfers to an AD whether it be RW or stair rails. Discussed sequencing from car transfer to stairs to get up to pt's apartment. Pt is able to perform several stairs with both R and L handrail and min HHA. Pt's friend is then able to perform return demo of assisting pt with stairs. Pt and her friend report feeling confident with stair management. Pt is ready for d/c. Assisted pt downstairs via  w/c to her friend's car. Again discussed safety in the home and that therapy recommending pt perform at least 90% of her mobility in her apartment with her w/c and that she is only to use her RW to perform transfers to [facility], to stand in kitchen, or to stand up to her washing machine. Reiterated fall risk associated with obstacles in the home including patient's two cats. Pt is able to perform car transfer into [employer] with setup A for [employer]. Pt left safely seated in car with her friend in order to d/c home.  PT Discharge Precautions/Restrictions Precautions Precautions: Fall;Cervical Precaution Comments: reviewed cervical precautions Restrictions Weight Bearing Restrictions: No Vision/Perception  Perception Perception: Within Functional Limits Praxis Praxis: Intact  Cognition Overall Cognitive Status: Within Functional Limits for tasks assessed Arousal/Alertness: Awake/alert Orientation Level: Oriented X4 Attention: Focused Focused Attention: Appears intact Memory: Appears intact Awareness: Appears intact Problem Solving: Appears intact Safety/Judgment: Appears intact Sensation Sensation Light Touch: Impaired Detail Peripheral sensation comments: Impaired in ulnar nerve disribution in R UE Light Touch Impaired Details: Impaired RUE Proprioception: Appears Intact Coordination Gross Motor Movements are Fluid and Coordinated: No Fine Motor Movements are Fluid and Coordinated: No Coordination and Movement Description: Impaired 2/2 generalized weakness and deconditioning Motor  Motor Motor: Abnormal tone;Tetraplegia Motor - Skilled Clinical Observations: R side>L side quadraplegia Motor - Discharge Observations: R side > L side quadriplegia  Mobility Bed Mobility Bed Mobility: Rolling Right;Rolling Left;Supine to Sit;Sit to Supine Rolling Right: Independent with assistive device Rolling Left: Independent with assistive device Supine to Sit: Independent with assistive  device Sit to Supine: Independent with assistive device Transfers Transfers: Sit to Stand;Stand Pivot  Transfers Sit to Stand: Independent with assistive device Stand Pivot Transfers: Independent with assistive device Transfer (Assistive device): Rolling walker Locomotion  Gait Ambulation: Yes Gait Assistance: Independent with assistive device Gait Distance (Feet): 100 Feet Assistive device: Rolling walker Gait Gait: Yes Gait Pattern: Impaired Gait Pattern: Step-to pattern;Decreased step length - right;Decreased stance time - left Gait velocity: decreased Stairs / Additional Locomotion Stairs: Yes Stairs Assistance: Minimal Assistance - Patient > 75% Stair Management Technique: One rail Right;One rail Left;Step to pattern Number of Stairs: 12 Height of Stairs: 6 Curb: Minimal Assistance - Patient >75% Architect: Yes Wheelchair Assistance: Independent with Camera operator: Both upper extremities Wheelchair Parts Management: Independent Distance: 150  Trunk/Postural Assessment  Cervical Assessment Cervical Assessment: Exceptions to WFL(cervical precautions) Thoracic Assessment Thoracic Assessment: Exceptions to WFL(kyphotic; rounded shoulders) Lumbar Assessment Lumbar Assessment: Exceptions to WFL(posterior pelvic tilt) Postural Control Postural Control: Deficits on evaluation Trunk Control: impaired Postural Limitations: impaired  Balance Balance Balance Assessed: Yes Static Sitting Balance Static Sitting - Balance Support: Feet supported;No upper extremity supported Static Sitting - Level of Assistance: 7: Independent Dynamic Sitting Balance Dynamic Sitting - Balance Support: Feet supported;During functional activity Dynamic Sitting - Level of Assistance: 6: Modified independent (Device/Increase time) Sitting balance - Comments: Sitting to complete bathing routine on TTB Static Standing Balance Static Standing -  Balance Support: During functional activity;Right upper extremity supported;Left upper extremity supported Static Standing - Level of Assistance: 6: Modified independent (Device/Increase time) Static Standing - Comment/# of Minutes: Standing with RW Dynamic Standing Balance Dynamic Standing - Balance Support: During functional activity;Right upper extremity supported;Left upper extremity supported Dynamic Standing - Level of Assistance: 6: Modified independent (Device/Increase time) Dynamic Standing - Comments: Standing to complete LB clothing management/toileting task Extremity Assessment  RUE Assessment RUE Assessment: Exceptions to [facility] Passive Range of Motion (PROM) Comments: WFLs General Strength Comments: weak gross grasp; 4-/5 throughout LUE Assessment LUE Assessment: Within Functional Limits RLE Assessment RLE Assessment: Exceptions to [facility] General Strength Comments: impaired, see below RLE Strength Right Hip Flexion: 3/5 Right Knee Flexion: 3/5 Right Knee Extension: 3/5 Right Ankle Dorsiflexion: 3/5 LLE Assessment LLE Assessment: Within Functional Limits LLE Strength Left Hip Flexion: 4/5 Left Knee Flexion: 4/5 Left Knee Extension: 4/5 Left Ankle Dorsiflexion: 4/5     Excell Seltzer, PT, DPT 10/08/2019, 4:28 PM

## 2019-10-08 NOTE — Progress Notes (Signed)
Pt seems depressed at beginning of shift, anxious about discharge today. Emotional support and encouragement given. Needing heavy encouragement to perform her own adls, and becoming irritable when asking her how she will perform in scenarios at home...(.ie pulling up pants in bathroom, getting oob without using staff assistance)  Slept well throughout night. Pending discharge

## 2019-10-08 NOTE — Progress Notes (Signed)
Occupational Therapy Discharge Summary  Patient Details  Name: Sandra Parrish MRN: [id number] Date of Birth: 05-09-1946   Patient has met 56 of 64 long term goals due to improved activity tolerance, improved balance, postural control, ability to compensate for deficits, functional use of  RIGHT upper and LEFT upper extremity and improved coordination.  Patient to discharge at overall Modified Independent level.  Patient's care partner is independent to provide the necessary physical assistance at discharge.  Pt to have PRN assist from friends at d/c. Pt's friend completed hands on family training shower tub/shower transfer using tub transfer bench. Recommend supervision assist for shower transfer and bathing task at d/c. Recommend use of BSC at bedside for nighttime toileting and pt purchased raised toilet seat for use on standard toilet. Completing home management tasks from w/c level mod I. Recommending w/c level for IADLs at d/c for energy conservation and increased safety as pt will be alone majority of time, pt voices understanding. She is able to complete short distance functional ambulation into/out of bathroom with RW mod I.  Education and resources provided for [facility] agencies to assist with IADLs PRN. Pt can fatigue quickly which increases fall risk.  Pt demonstrates fair awareness into deficits and functional implications  Recommendation:  Patient will benefit from ongoing skilled OT services in home health setting to continue to advance functional skills in the area of BADL, iADL and Reduce care partner burden.  Equipment: TTB and BSC  Reasons for discharge: treatment goals met and discharge from hospital  Patient/family agrees with progress made and goals achieved: Yes  OT Discharge Precautions/Restrictions  Precautions Precautions: Fall;Cervical Restrictions Weight Bearing Restrictions: No Vision Baseline Vision/History: Wears glasses Wears Glasses: Reading only Patient  Visual Report: No change from baseline Vision Assessment?: No apparent visual deficits Perception  Perception: Within Functional Limits Praxis Praxis: Intact Cognition Overall Cognitive Status: Within Functional Limits for tasks assessed Arousal/Alertness: Awake/alert Orientation Level: Oriented X4 Focused Attention: Appears intact Memory: Appears intact Awareness: Appears intact Problem Solving: Appears intact Safety/Judgment: Appears intact Sensation Sensation Light Touch: Impaired Detail Peripheral sensation comments: Impaired in ulnar nerve disribution in R UE Light Touch Impaired Details: Impaired RUE Proprioception: Appears Intact Coordination Gross Motor Movements are Fluid and Coordinated: No Fine Motor Movements are Fluid and Coordinated: No Coordination and Movement Description: Impaired 2/2 generalized weakness and deconditioning Motor  Motor Motor: Abnormal tone;Tetraplegia Motor - Skilled Clinical Observations: R side>L side quadraplegia Trunk/Postural Assessment  Cervical Assessment Cervical Assessment: Exceptions to WFL(Cervical pre-cautions) Thoracic Assessment Thoracic Assessment: Exceptions to WFL(Kyphotic; Rounded shoulders) Lumbar Assessment Lumbar Assessment: Exceptions to WFL(Posterior pelvic tilt) Postural Control Postural Control: Deficits on evaluation Trunk Control: impaired Postural Limitations: impaired  Balance Balance Balance Assessed: Yes Static Sitting Balance Static Sitting - Balance Support: Feet supported;No upper extremity supported Static Sitting - Level of Assistance: 7: Independent Dynamic Sitting Balance Dynamic Sitting - Balance Support: Feet supported;During functional activity Dynamic Sitting - Level of Assistance: 6: Modified independent (Device/Increase time) Sitting balance - Comments: Sitting to complete bathing routine on TTB Static Standing Balance Static Standing - Balance Support: During functional activity;Right  upper extremity supported;Left upper extremity supported Static Standing - Level of Assistance: 6: Modified independent (Device/Increase time) Static Standing - Comment/# of Minutes: Standing with RW Dynamic Standing Balance Dynamic Standing - Balance Support: During functional activity;Right upper extremity supported;Left upper extremity supported Dynamic Standing - Level of Assistance: 6: Modified independent (Device/Increase time) Dynamic Standing - Comments: Standing to complete LB clothing management/toileting task Extremity/Trunk Assessment RUE  Assessment RUE Assessment: Exceptions to [facility] Passive Range of Motion (PROM) Comments: WFLs General Strength Comments: weak gross grasp; 4-/5 throughout LUE Assessment LUE Assessment: Within Functional Limits   Nury Nebergall L 10/08/2019, 4:16 PM

## 2019-10-08 NOTE — Progress Notes (Signed)
Occupational Therapy Session Note  Patient Details  Name: Sandra Parrish MRN: [id number] Date of Birth: [birth year]  Today's Date: 10/08/2019 OT Individual Time: 1330-1400 OT Individual Time Calculation (min): 30 min    Short Term Goals: Week 3:  OT Short Term Goal 1 (Week 3): STG=LTG due to LOS  Skilled Therapeutic Interventions/Progress Updates:    Pt seen for OT family education session with pt's friend [name].  Pt sitting up in w/c upon arrival, agreeable to tx session and denying pain.  Reviewed with pt and Katie recommendations for d/c including w/c level for IADLs, short distance ambulation with RW, and recommendation for use of BSC at bedside for night time toileting tasks. Pt initially refusing BSC, however, with education and encouragement from therapist and [name], pt agreeable. In ADL apartment, pt completed simulated tub/shower transfer utilizing tub bench. Completed with min A for management of B LEs over tub wall. Education provided regarding set-up of tub bench and recommendations for supervision for bathing tasks and transfer at d/c.  Pt left seated in w/c at end of session with hand off to PT.   Therapy Documentation Precautions:  Precautions Precautions: Fall, Cervical Precaution Comments: reviewed cervical precautions Cervical Brace: Soft collar Restrictions Weight Bearing Restrictions: No   Therapy/Group: Individual Therapy  Brionne Mertz L 10/08/2019, 7:10 AM

## 2019-10-11 NOTE — Progress Notes (Signed)
Social Work Discharge Note   The overall goal for the admission was met for:   Discharge location: Yes  Length of Stay: Yes  Discharge activity level: Yes  Home/community participation: Yes  Services provided included: MD, RD, PT, OT, RN, TR, Pharmacy, [place]: Medicare  Follow-up services arranged: Home Health: PT, OT, SW via [facility], DME: 20x18 lightweight w/c, cushion, rolling walker, 3n1 commode and tub bench via [employer] and Patient/Family has no preference for HH/DME agencies  Comments (or additional information):    Contact info:  Pt @ [phone]  Patient/Family verbalized understanding of follow-up arrangements: Yes  Individual responsible for coordination of the follow-up plan: pt  Confirmed correct DME delivered: Anvi Mangal 10/11/2019    Harneet Noblett

## 2019-10-12 ENCOUNTER — Telehealth: Payer: Self-pay

## 2019-10-12 NOTE — Telephone Encounter (Signed)
  TRANSITIONAL CARE CALL attempted, no answer, left voicemail to return call   Appointment Date and Time: 10-18-2019 / 940am With: Zella Ball first then Dr. Dagoberto Ligas

## 2019-10-13 ENCOUNTER — Telehealth: Payer: Self-pay | Admitting: *Deleted

## 2019-10-13 NOTE — Telephone Encounter (Signed)
Attempt 2, no answer again, left message to return call.

## 2019-10-13 NOTE — Telephone Encounter (Signed)
Cindy with [facility] called to say due to staffing the start of care will be 10/17/19.

## 2019-10-18 ENCOUNTER — Encounter: Payer: Self-pay | Attending: Registered Nurse | Admitting: Registered Nurse

## 2019-10-18 ENCOUNTER — Telehealth: Payer: Self-pay | Admitting: *Deleted

## 2019-10-18 NOTE — Telephone Encounter (Signed)
Anda Kraft PT called for POC 2wk1, 1wk8.  Approval given.

## 2019-10-26 DIAGNOSIS — R339 Retention of urine, unspecified: Secondary | ICD-10-CM

## 2019-10-26 DIAGNOSIS — K592 Neurogenic bowel, not elsewhere classified: Secondary | ICD-10-CM

## 2019-10-26 DIAGNOSIS — G5621 Lesion of ulnar nerve, right upper limb: Secondary | ICD-10-CM

## 2019-10-26 DIAGNOSIS — Z9181 History of falling: Secondary | ICD-10-CM

## 2019-10-26 DIAGNOSIS — G9589 Other specified diseases of spinal cord: Secondary | ICD-10-CM

## 2019-10-26 DIAGNOSIS — Z4789 Encounter for other orthopedic aftercare: Secondary | ICD-10-CM

## 2019-10-26 DIAGNOSIS — Z981 Arthrodesis status: Secondary | ICD-10-CM

## 2019-10-26 DIAGNOSIS — K5900 Constipation, unspecified: Secondary | ICD-10-CM

## 2019-12-20 ENCOUNTER — Telehealth: Payer: Self-pay

## 2019-12-20 NOTE — Telephone Encounter (Signed)
Flor, PT from [facility] called requesting verbal orders for extension HHPT 2wk5

## 2019-12-20 NOTE — Telephone Encounter (Signed)
PT is calling for extension on [facility] therapy. Patient never showed for Transitional Care appt and no future appt made. Will you okay orders for patient to extend therapy?

## 2019-12-20 NOTE — Telephone Encounter (Signed)
If pt willing to come in, yes; if not, please ask then to call pt's PCP.

## 2019-12-21 NOTE — Telephone Encounter (Signed)
Spoke with Charlie Pitter, PT, [facility].She will reach out to [patient and see if she wants to make an appointment to follow up with Dr. Berline Chough or seek therapy orders from PCP.

## 2019-12-22 NOTE — Telephone Encounter (Signed)
Spoke with Dr. Berline Chough and she states that she has to see person physically, no virtual visit to determine if patient is eligible for continuation of HH therapies. Spoke with Claris Che at [facility] and let her know, also tried calling patient but no answer and no voicemail.

## 2020-01-12 ENCOUNTER — Telehealth: Payer: Self-pay

## 2020-01-12 NOTE — Telephone Encounter (Signed)
Sandra Parrish with [facility] called and would like an order to extend PT  [phone]

## 2020-01-13 NOTE — Telephone Encounter (Signed)
This patient was discharged in December and no showed for Transitional Care appt with [facility].

## 2020-01-13 NOTE — Telephone Encounter (Signed)
Then she will have to call PCP to get more therapy.

## 2020-01-14 NOTE — Telephone Encounter (Signed)
Left Sandra Parrish a message notifying her of the therapy orders.

## 2020-06-04 ENCOUNTER — Telehealth: Payer: Self-pay | Admitting: Physician Assistant

## 2020-06-04 NOTE — Telephone Encounter (Signed)
Called to discuss the homebound Covid-19 vaccination initiative with the patient and/or caregiver.   Message left to call back.  Amand Lemoine PA-C  [facility]     

## 2020-06-07 ENCOUNTER — Telehealth: Payer: Self-pay | Admitting: Physician Assistant

## 2020-06-07 NOTE — Telephone Encounter (Signed)
I connected by phone with Sandra Parrish and/or patient's caregiver on 06/07/2020 at 10:17 AM to discuss the potential vaccination through our Homebound vaccination initiative.   Prevaccination Checklist for COVID-19 Vaccines  1.  Are you feeling sick today? no  2.  Have you ever received a dose of a COVID-19 vaccine?  no      If yes, which one? None   3.  Have you ever had an allergic reaction: (This would include a severe reaction [ e.g., anaphylaxis] that required treatment with epinephrine or EpiPen or that caused you to go to the hospital.  It would also include an allergic reaction that occurred within 4 hours that caused hives, swelling, or respiratory distress, including wheezing.) A.  A previous dose of COVID-19 vaccine. no  B.  A vaccine or injectable therapy that contains multiple components, one of which is a COVID-19 vaccine component, but it is not known which component elicited the immediate reaction. no  C.  Are you allergic to polyethylene glycol? no  D. Are you allergic to Polysorbate, which is found in some vaccines, film coated tablets and intravenous steroids?  no   4.  Have you ever had an allergic reaction to another vaccine (other than COVID-19 vaccine) or an injectable medication? (This would include a severe reaction [ e.g., anaphylaxis] that required treatment with epinephrine or EpiPen or that caused you to go to the hospital.  It would also include an allergic reaction that occurred within 4 hours that caused hives, swelling, or respiratory distress, including wheezing.)  no   5.  Have you ever had a severe allergic reaction (e.g., anaphylaxis) to something other than a component of the COVID-19 vaccine, or any vaccine or injectable medication?  This would include food, pet, venom, environmental, or oral medication allergies.  no   6.  Have you received any vaccine in the last 14 days? no   7.  Have you ever had a positive test for COVID-19 or has a doctor ever told  you that you had COVID-19?  no   8.  Have you received passive antibody therapy (monoclonal antibodies or convalescent serum) as a treatment for COVID-19? no   9.  Do you have a weakened immune system caused by something such as HIV infection or cancer or do you take immunosuppressive drugs or therapies?  no   10.  Do you have a bleeding disorder or are you taking a blood thinner? no   11.  Are you pregnant or breast-feeding? no   12.  Do you have +dermal fillers? no   __________________   This patient is a 74 y.o. female that meets the FDA criteria to receive homebound vaccination. Patient or parent/caregiver understands they have the option to accept or refuse homebound vaccination.  Patient passed the pre-screening checklist and would like to proceed with homebound vaccination.  Based on questionnaire above, I recommend the patient be observed for 15 minutes.  There are an estimated no other household members/caregivers who are also interested in receiving the vaccine.   I will send the patient's information to our scheduling team who will reach out to schedule the patient and potential caregiver/family members for homebound vaccination.    Sandra Parrish 06/07/2020 10:17 AM

## 2020-12-08 IMAGING — CT CT HEAD W/O CM
4 series · 15 of 47 positions shown, 17 images · non-contrast
Comparison: None.

CLINICAL DATA: Mechanical fall no loss of consciousness or
anticoagulation.

EXAM:
CT HEAD WITHOUT CONTRAST
CT CERVICAL SPINE WITHOUT CONTRAST
TECHNIQUE: Multidetector CT imaging of the head and cervical spine was
performed following the standard protocol without intravenous
contrast. Multiplanar CT image reconstructions of the cervical spine
were also generated.

[Series 3: head without · axial · non-contrast · 0.41mm/px · z∈[-169,-44]mm · 7 of 35 slices shown, 9 images]
[im 5/35  brain]
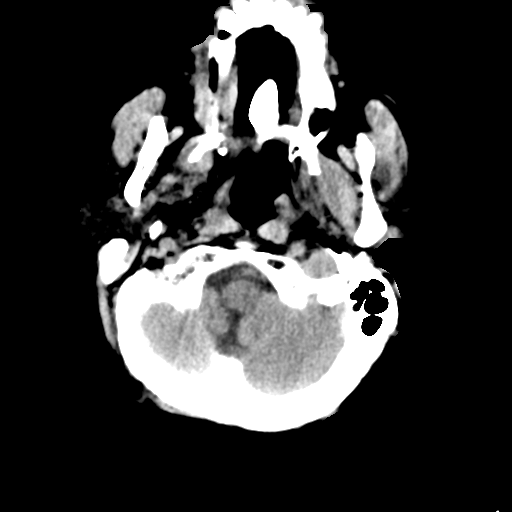
[im 5/35  bone]
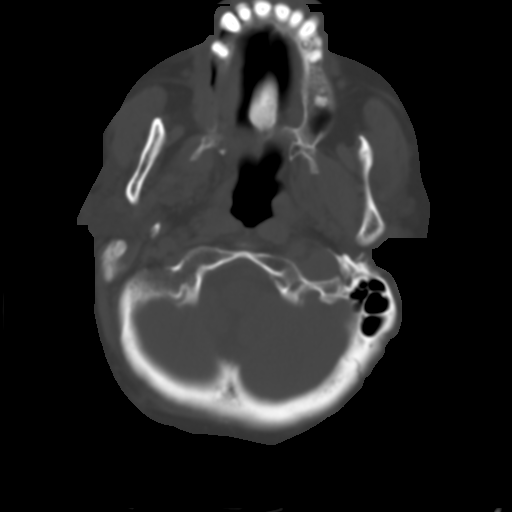
[im 9/35  brain]
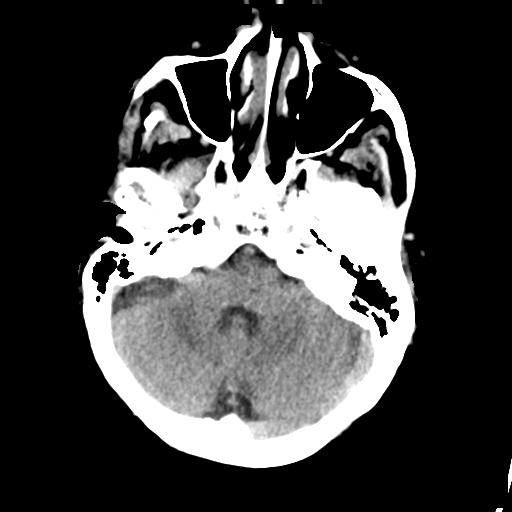
[im 13/35  brain]
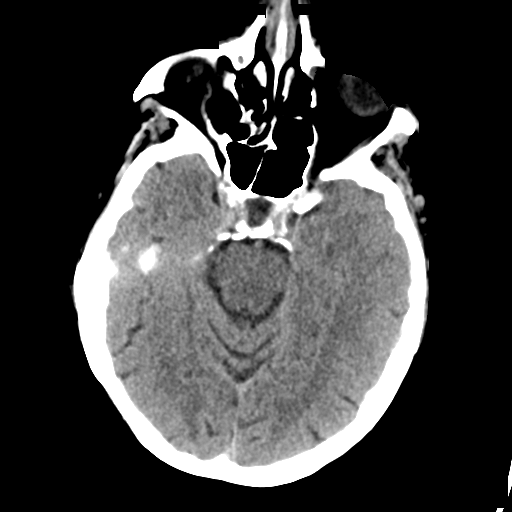
[im 18/35  brain]
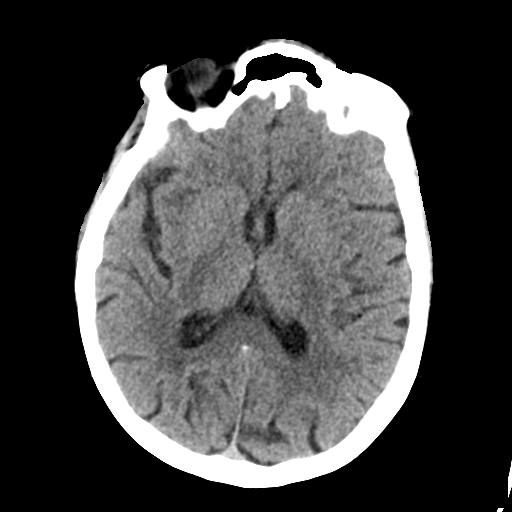
[im 22/35  brain]
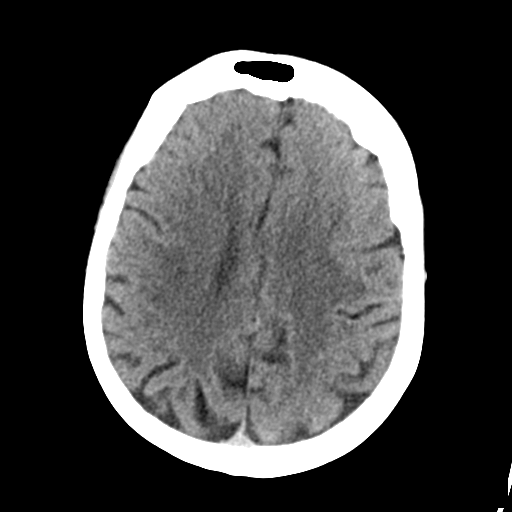
[im 22/35  bone]
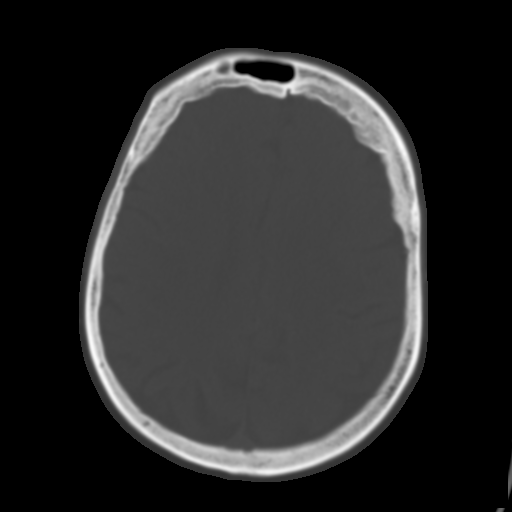
[im 26/35  brain]
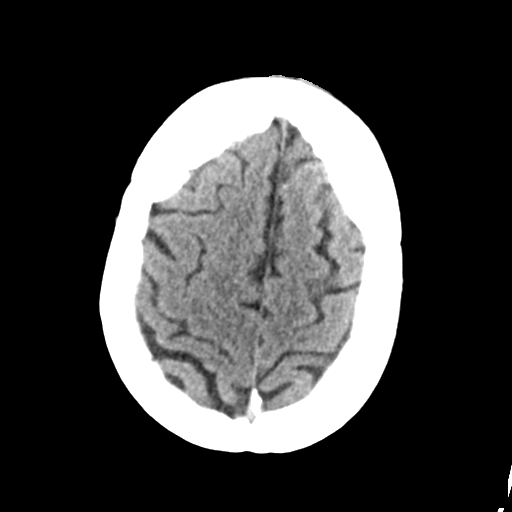
[im 30/35  brain]
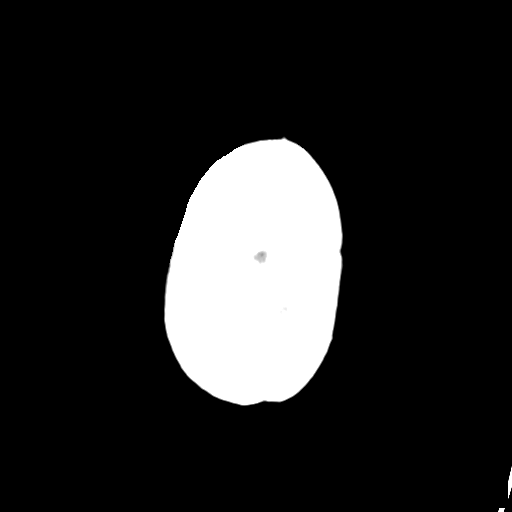

[Series 4: head bone · axial · 0.41mm/px · z∈[-173,-155]mm · 2 of 88 slices shown]
[im 9/88  bone]
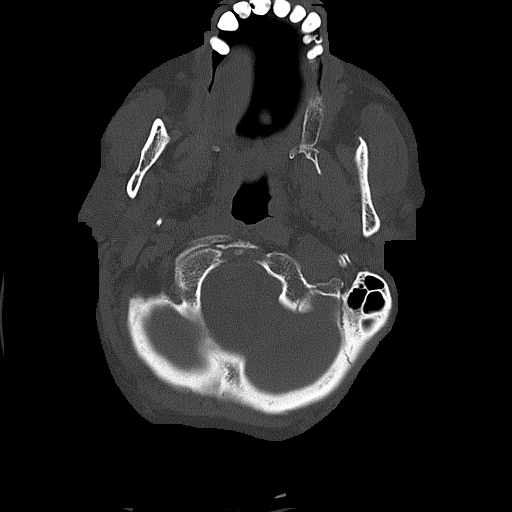
[im 18/88  bone]
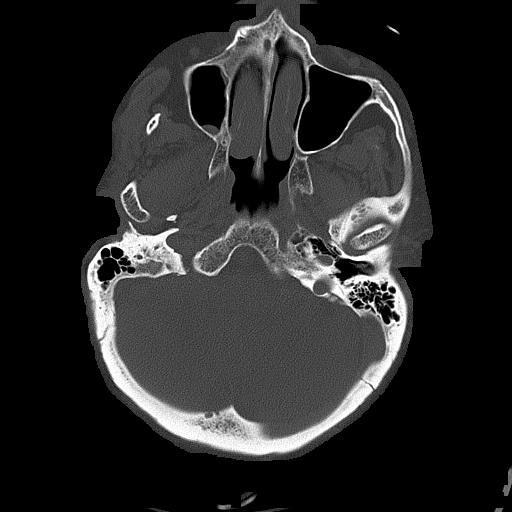

[Series 5: head without cor · coronal · non-contrast · 0.34mm/px · 3 of 67 slices shown]
[im 23/67  brain]
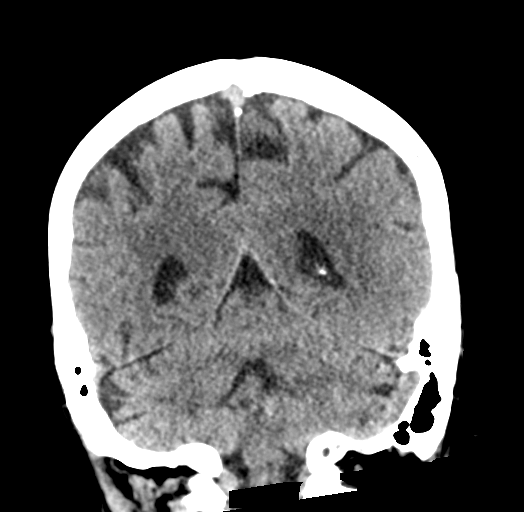
[im 30/67  brain]
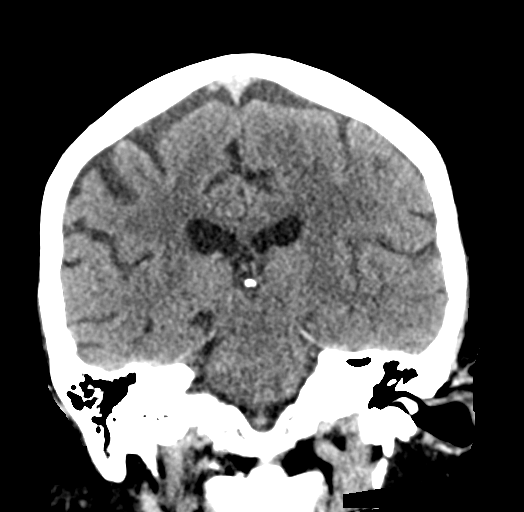
[im 37/67  brain]
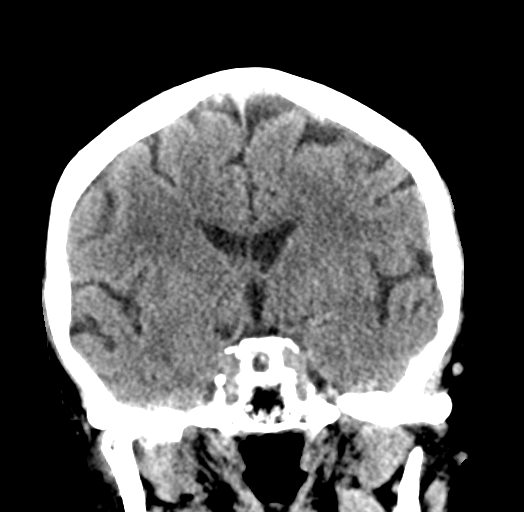

[Series 6: head without sag · sagittal · non-contrast · 0.34mm/px · 3 of 60 slices shown]
[im 20/60  brain]
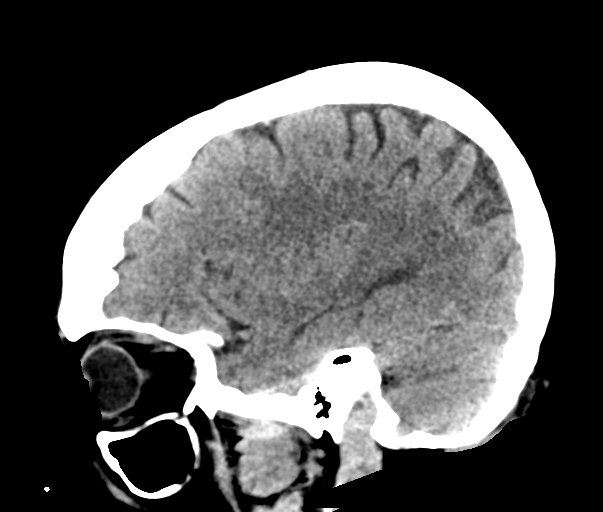
[im 30/60  brain]
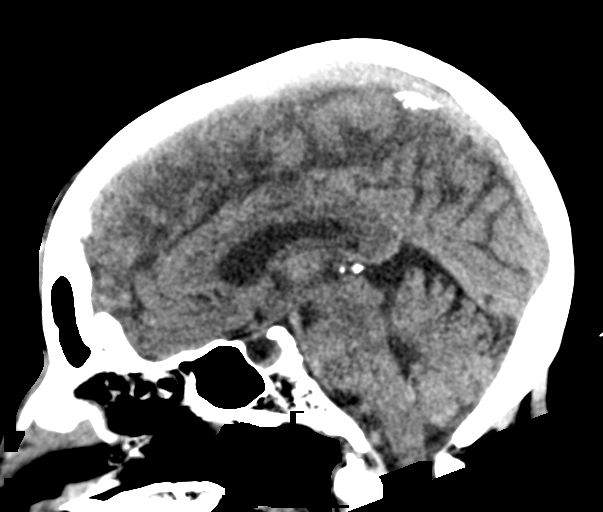
[im 40/60  brain]
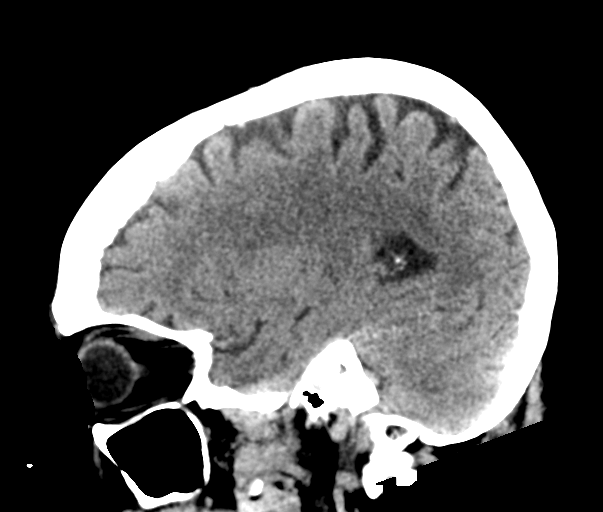

[15 of 47 positions shown; findings below may reference images not displayed]

FINDINGS: CT HEAD FINDINGS

Brain: No evidence of acute infarction, hemorrhage, hydrocephalus,
extra-axial collection or mass lesion/mass effect. Symmetric
prominence of the ventricles, cisterns and sulci compatible with
parenchymal volume loss. Patchy areas of white matter
hypoattenuation are most compatible with chronic microvascular
angiopathy.

Vascular: Atherosclerotic calcification of the carotid siphons. No
hyperdense vessel.

Skull: Midline frontal scalp swelling/contusion with small hematoma
measuring 4 mm in maximal thickness.

Sinuses/Orbits: Minimal mural thickening in the paranasal sinuses.
Mastoid air cells are clear. Included orbital structures are
unremarkable.

Other: None

CT CERVICAL SPINE FINDINGS

Alignment: Straightening and slight reversal of the normal cervical
lordosis centered at C5-6. No abnormal facet widening. Normal
alignment of the craniocervical and atlantoaxial articulations.

Skull base and vertebrae: No acute fracture. No primary bone lesion
or focal pathologic process.

Soft tissues and spinal canal: No pre or paravertebral fluid or
swelling. No visible canal hematoma. Mild pannus formation posterior
to the dens.

Disc levels: Multilevel intervertebral disc height loss with
spondylitic endplate changes. Diffuse facet hypertrophic changes and
uncinate spurring are present as well. Features maximal at C5-6 and
C6-7 with at least mild-to-moderate canal stenoses and moderate to
severe bilateral neural foraminal narrowing at these levels.

Upper chest: Atelectatic and hypoventilatory changes noted in the
upper chest. No acute worse some abnormality. Aortic arch
calcifications are noted.

Other: Normal thyroid gland.
IMPRESSION: 1. No acute intracranial abnormality.
2. Minimal parenchymal volume loss and chronic white matter
angiopathy changes.
3. Midline frontal scalp swelling/contusion with small hematoma
measuring 4 mm in maximal thickness. No calvarial fracture.
4. No acute cervical spine fracture or traumatic listhesis.
5. Multilevel degenerative changes maximal at C5-6 and C6-7 with at
least mild-to-moderate canal stenoses and moderate to severe
bilateral neural foraminal narrowing at these levels.
6.  Aortic Atherosclerosis (GB454-NYV.V).

## 2020-12-08 IMAGING — CT CT CERVICAL SPINE W/O CM
3 of 5 series · 12 of 33 positions shown, 14 images · non-contrast
Comparison: None.

CLINICAL DATA: Mechanical fall no loss of consciousness or
anticoagulation.

EXAM:
CT HEAD WITHOUT CONTRAST
CT CERVICAL SPINE WITHOUT CONTRAST
TECHNIQUE: Multidetector CT imaging of the head and cervical spine was
performed following the standard protocol without intravenous
contrast. Multiplanar CT image reconstructions of the cervical spine
were also generated.

[Series 6: c_spine 2.0 sag bone · sagittal · 0.27mm/px · 5 of 61 slices shown, 6 images]
[im 21/61  bone]
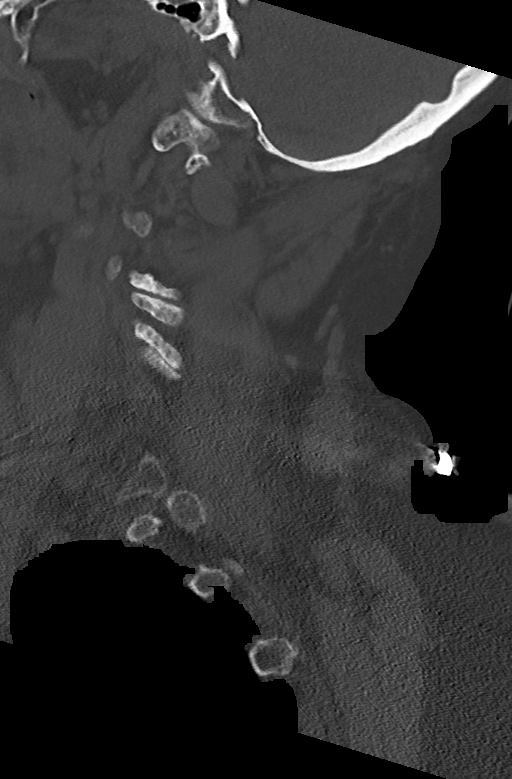
[im 26/61  bone]
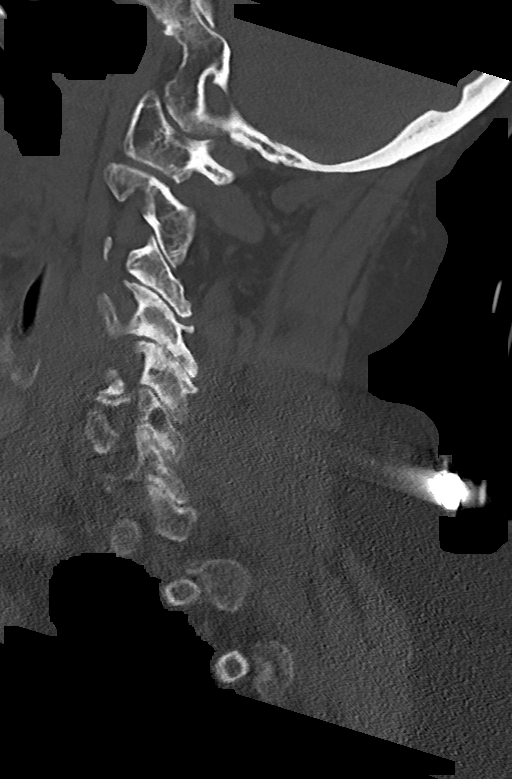
[im 31/61  soft-tissue]
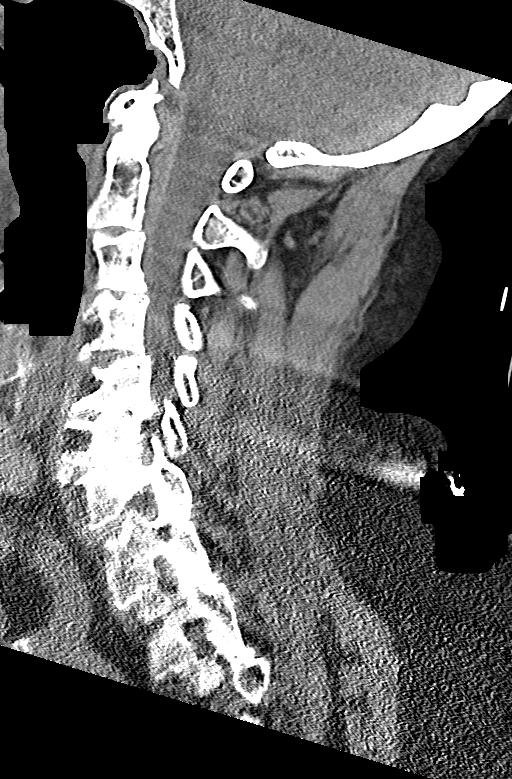
[im 31/61  bone]
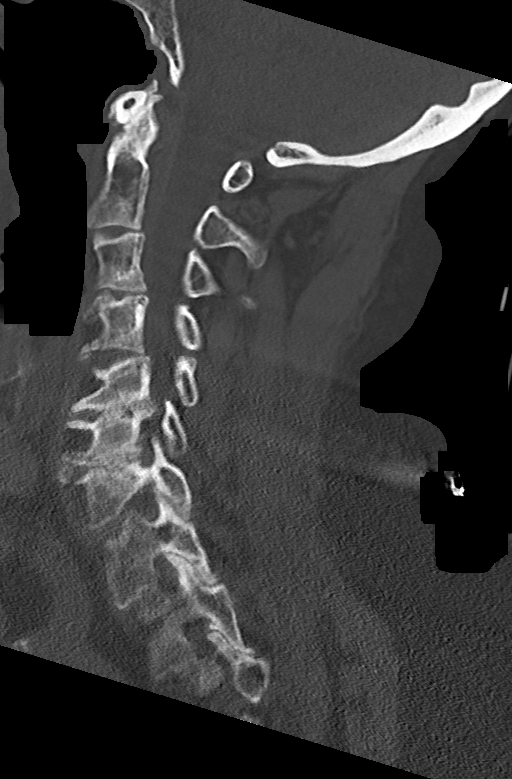
[im 36/61  bone]
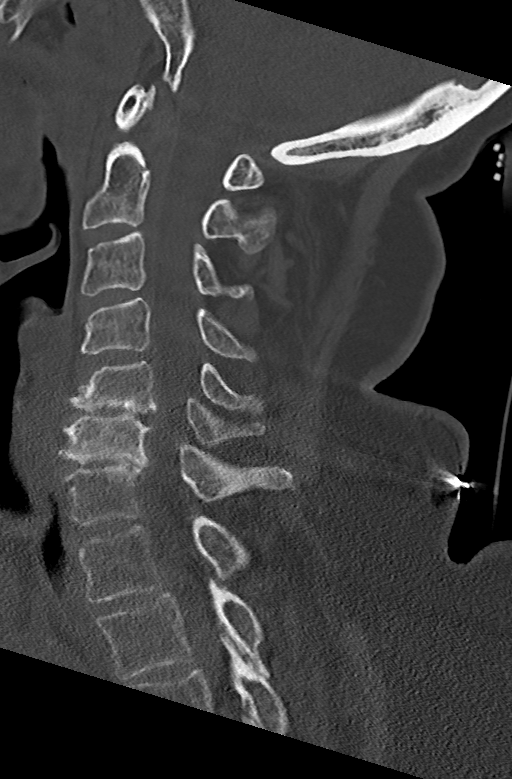
[im 41/61  bone]
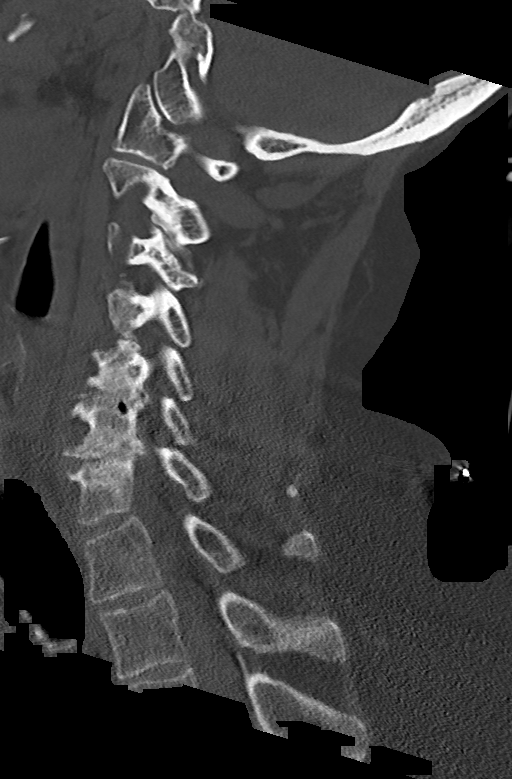

[Series 7: c_spine 2.0 cor bone · coronal · 0.27mm/px · 3 of 61 slices shown]
[im 13/61  bone]
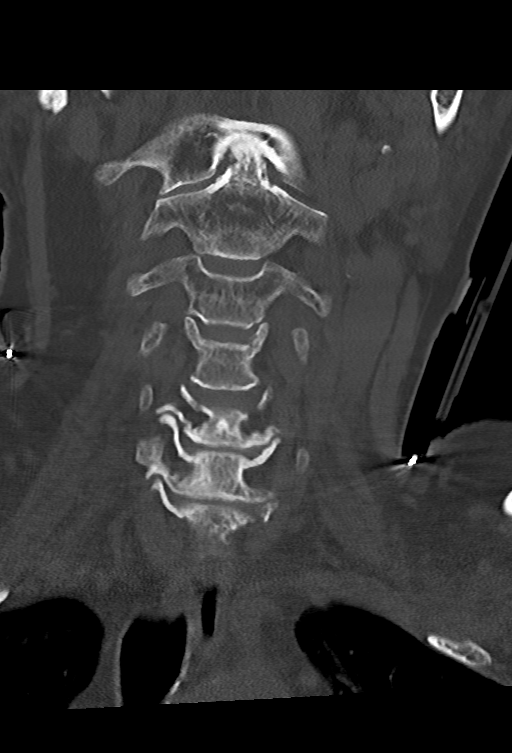
[im 25/61  bone]
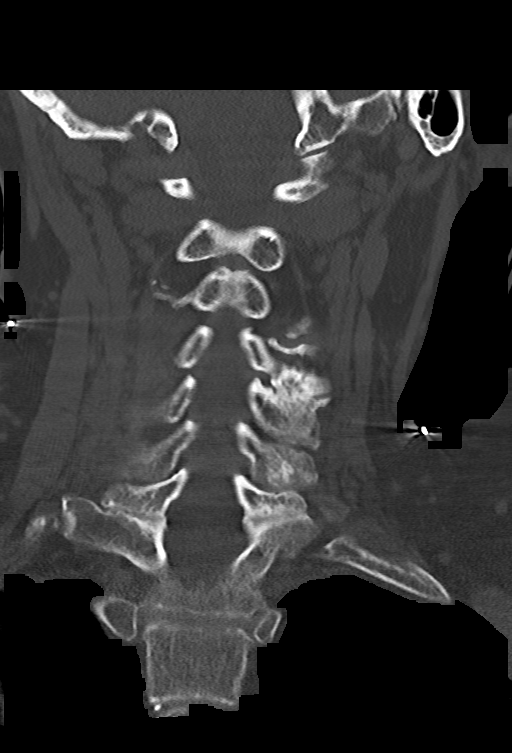
[im 37/61  bone]
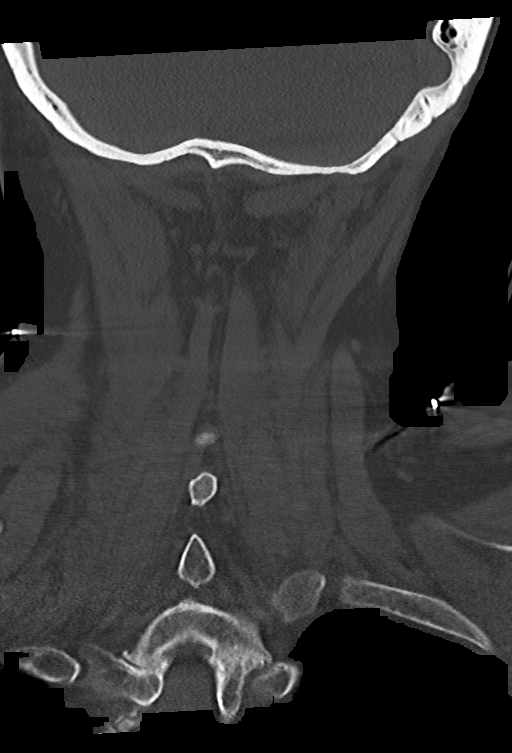

[Series 9: c_spine 1.0 st thins (person_name) · axial · 0.33mm/px · z∈[-300,-171]mm · 4 of 260 slices shown, 5 images]
[im 38/260  soft-tissue]
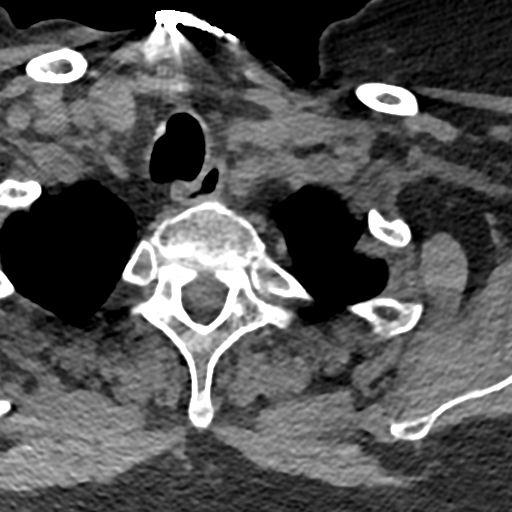
[im 38/260  bone]
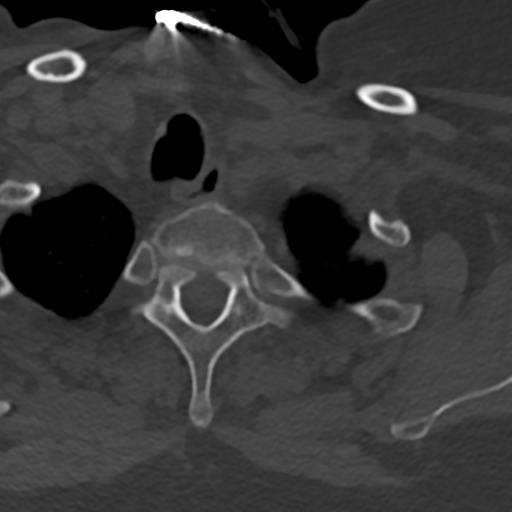
[im 112/260  bone]
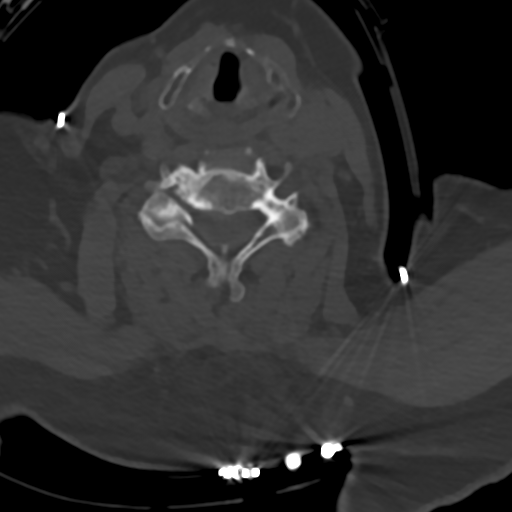
[im 149/260  bone]
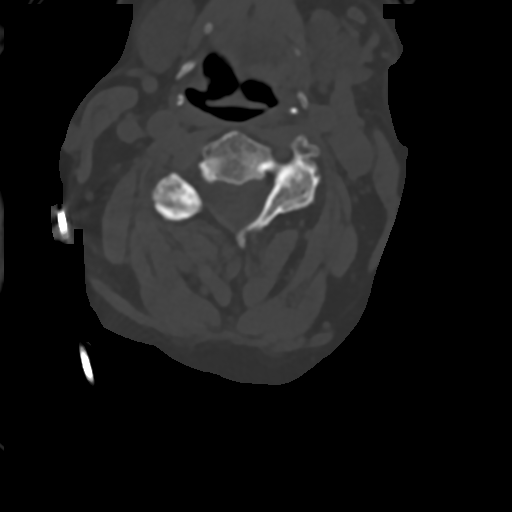
[im 223/260  bone]
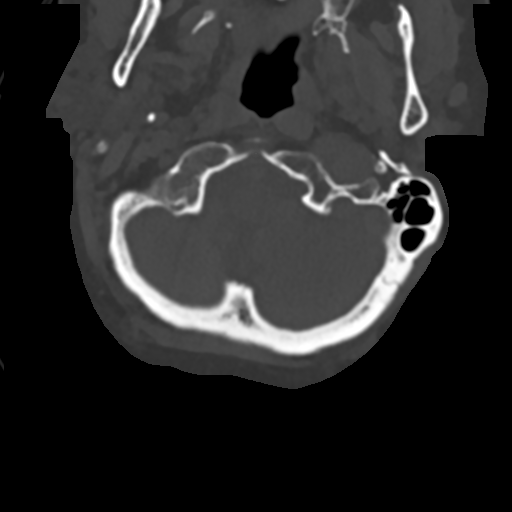

[12 of 33 positions shown; findings below may reference images not displayed]

FINDINGS: CT HEAD FINDINGS

Brain: No evidence of acute infarction, hemorrhage, hydrocephalus,
extra-axial collection or mass lesion/mass effect. Symmetric
prominence of the ventricles, cisterns and sulci compatible with
parenchymal volume loss. Patchy areas of white matter
hypoattenuation are most compatible with chronic microvascular
angiopathy.

Vascular: Atherosclerotic calcification of the carotid siphons. No
hyperdense vessel.

Skull: Midline frontal scalp swelling/contusion with small hematoma
measuring 4 mm in maximal thickness.

Sinuses/Orbits: Minimal mural thickening in the paranasal sinuses.
Mastoid air cells are clear. Included orbital structures are
unremarkable.

Other: None

CT CERVICAL SPINE FINDINGS

Alignment: Straightening and slight reversal of the normal cervical
lordosis centered at C5-6. No abnormal facet widening. Normal
alignment of the craniocervical and atlantoaxial articulations.

Skull base and vertebrae: No acute fracture. No primary bone lesion
or focal pathologic process.

Soft tissues and spinal canal: No pre or paravertebral fluid or
swelling. No visible canal hematoma. Mild pannus formation posterior
to the dens.

Disc levels: Multilevel intervertebral disc height loss with
spondylitic endplate changes. Diffuse facet hypertrophic changes and
uncinate spurring are present as well. Features maximal at C5-6 and
C6-7 with at least mild-to-moderate canal stenoses and moderate to
severe bilateral neural foraminal narrowing at these levels.

Upper chest: Atelectatic and hypoventilatory changes noted in the
upper chest. No acute worse some abnormality. Aortic arch
calcifications are noted.

Other: Normal thyroid gland.
IMPRESSION: 1. No acute intracranial abnormality.
2. Minimal parenchymal volume loss and chronic white matter
angiopathy changes.
3. Midline frontal scalp swelling/contusion with small hematoma
measuring 4 mm in maximal thickness. No calvarial fracture.
4. No acute cervical spine fracture or traumatic listhesis.
5. Multilevel degenerative changes maximal at C5-6 and C6-7 with at
least mild-to-moderate canal stenoses and moderate to severe
bilateral neural foraminal narrowing at these levels.
6.  Aortic Atherosclerosis (GB454-NYV.V).

## 2020-12-08 IMAGING — DX DG HIP (WITH OR WITHOUT PELVIS) 2-3V*R*
3 series · 3 of 3 positions shown · non-contrast
Comparison: None.

CLINICAL DATA: Status post fall, groin pain

EXAM:
DG HIP (WITH OR WITHOUT PELVIS) 2-3V RIGHT

[t pelvis ap]
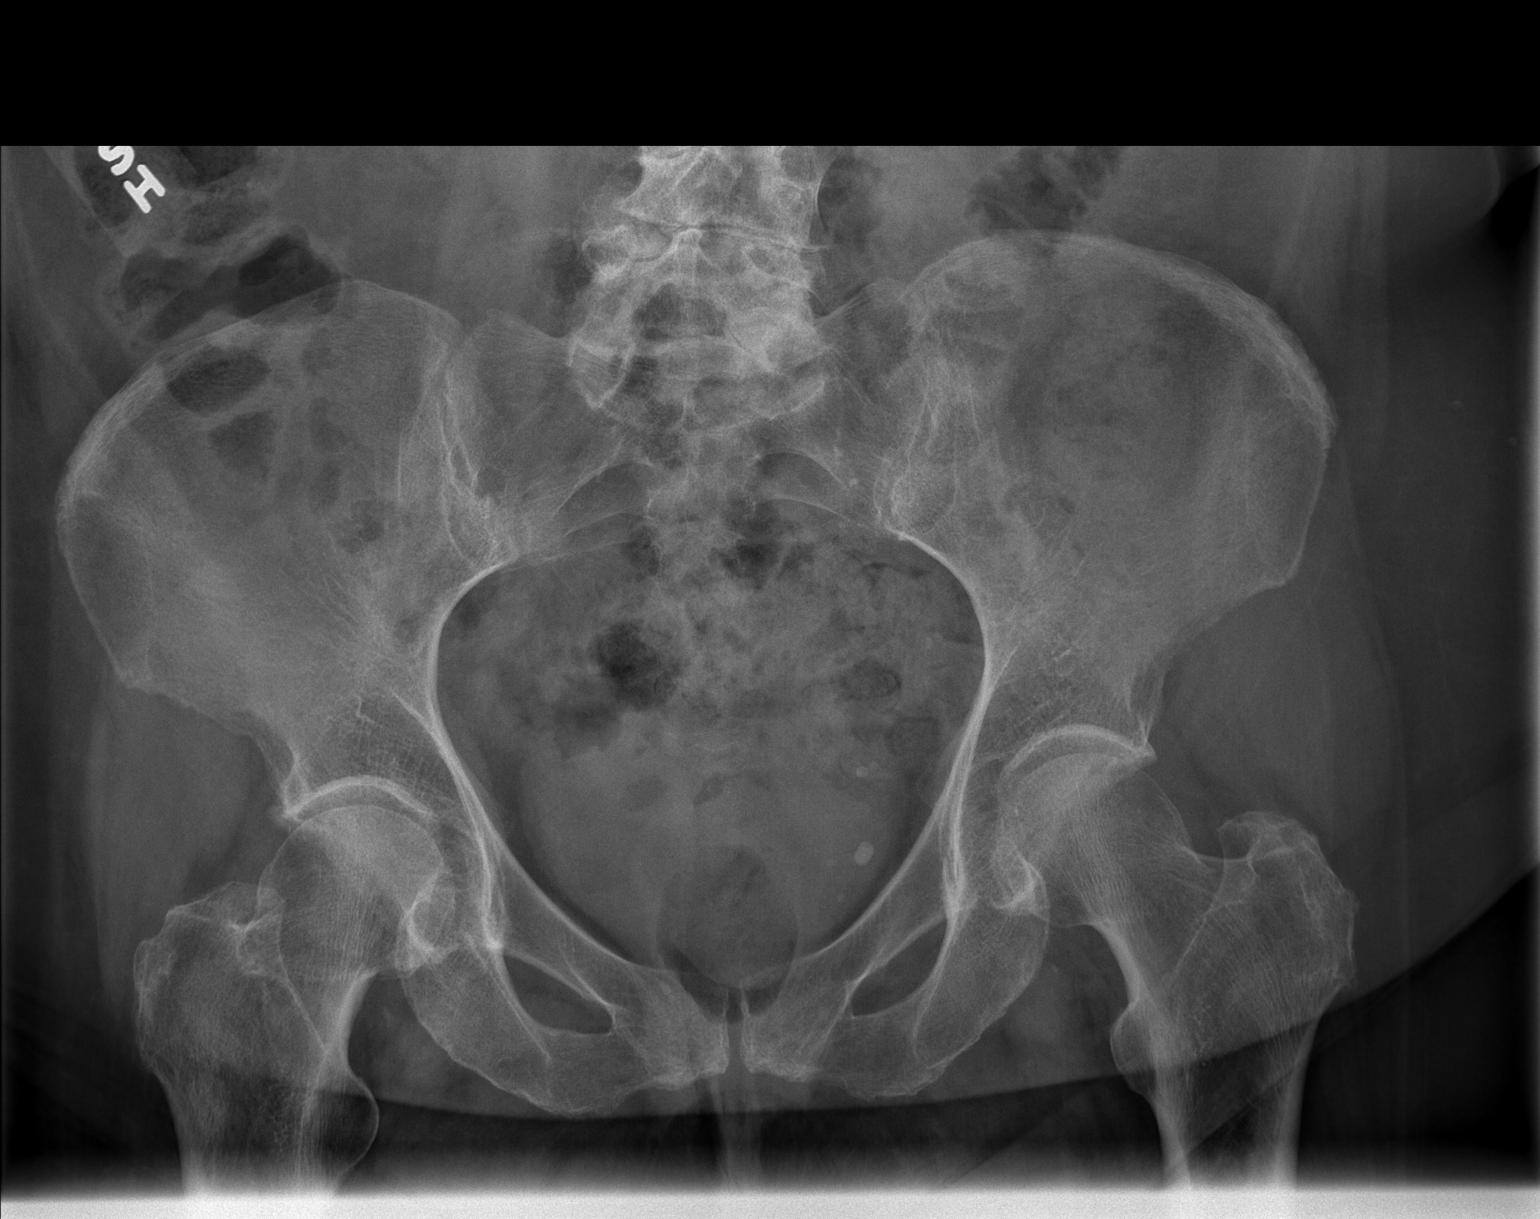

[t hip ap right]
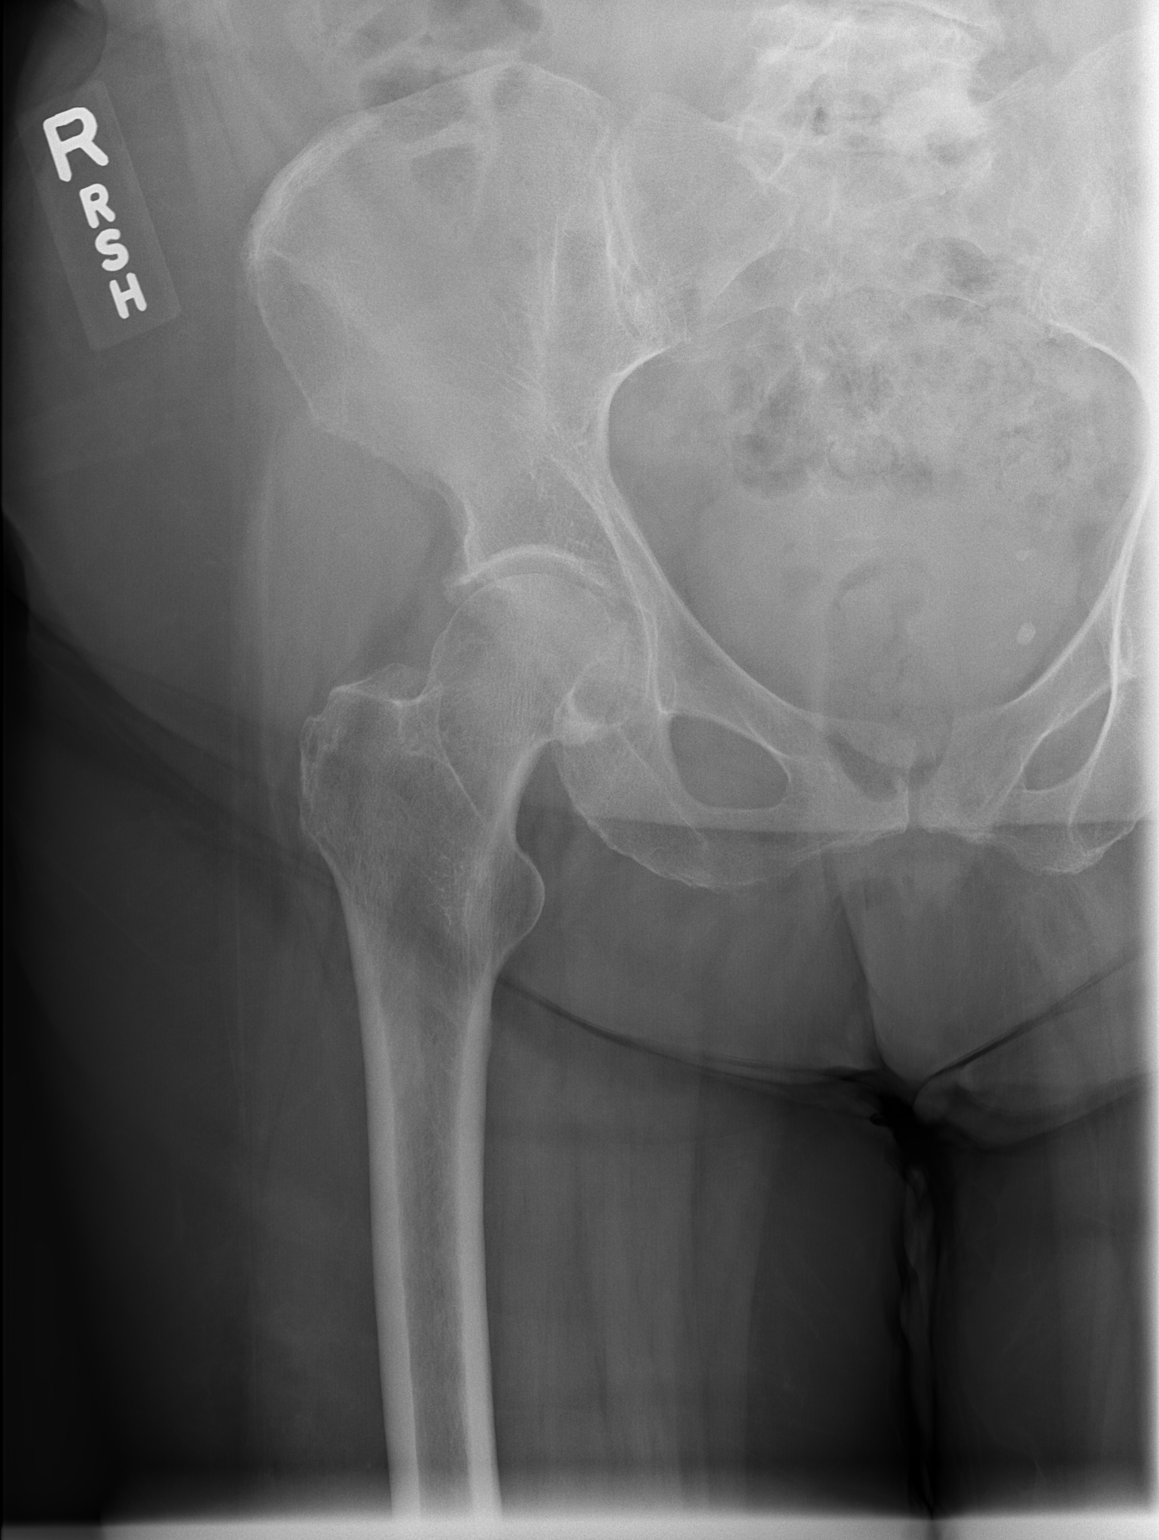

[t hip frog leg right]
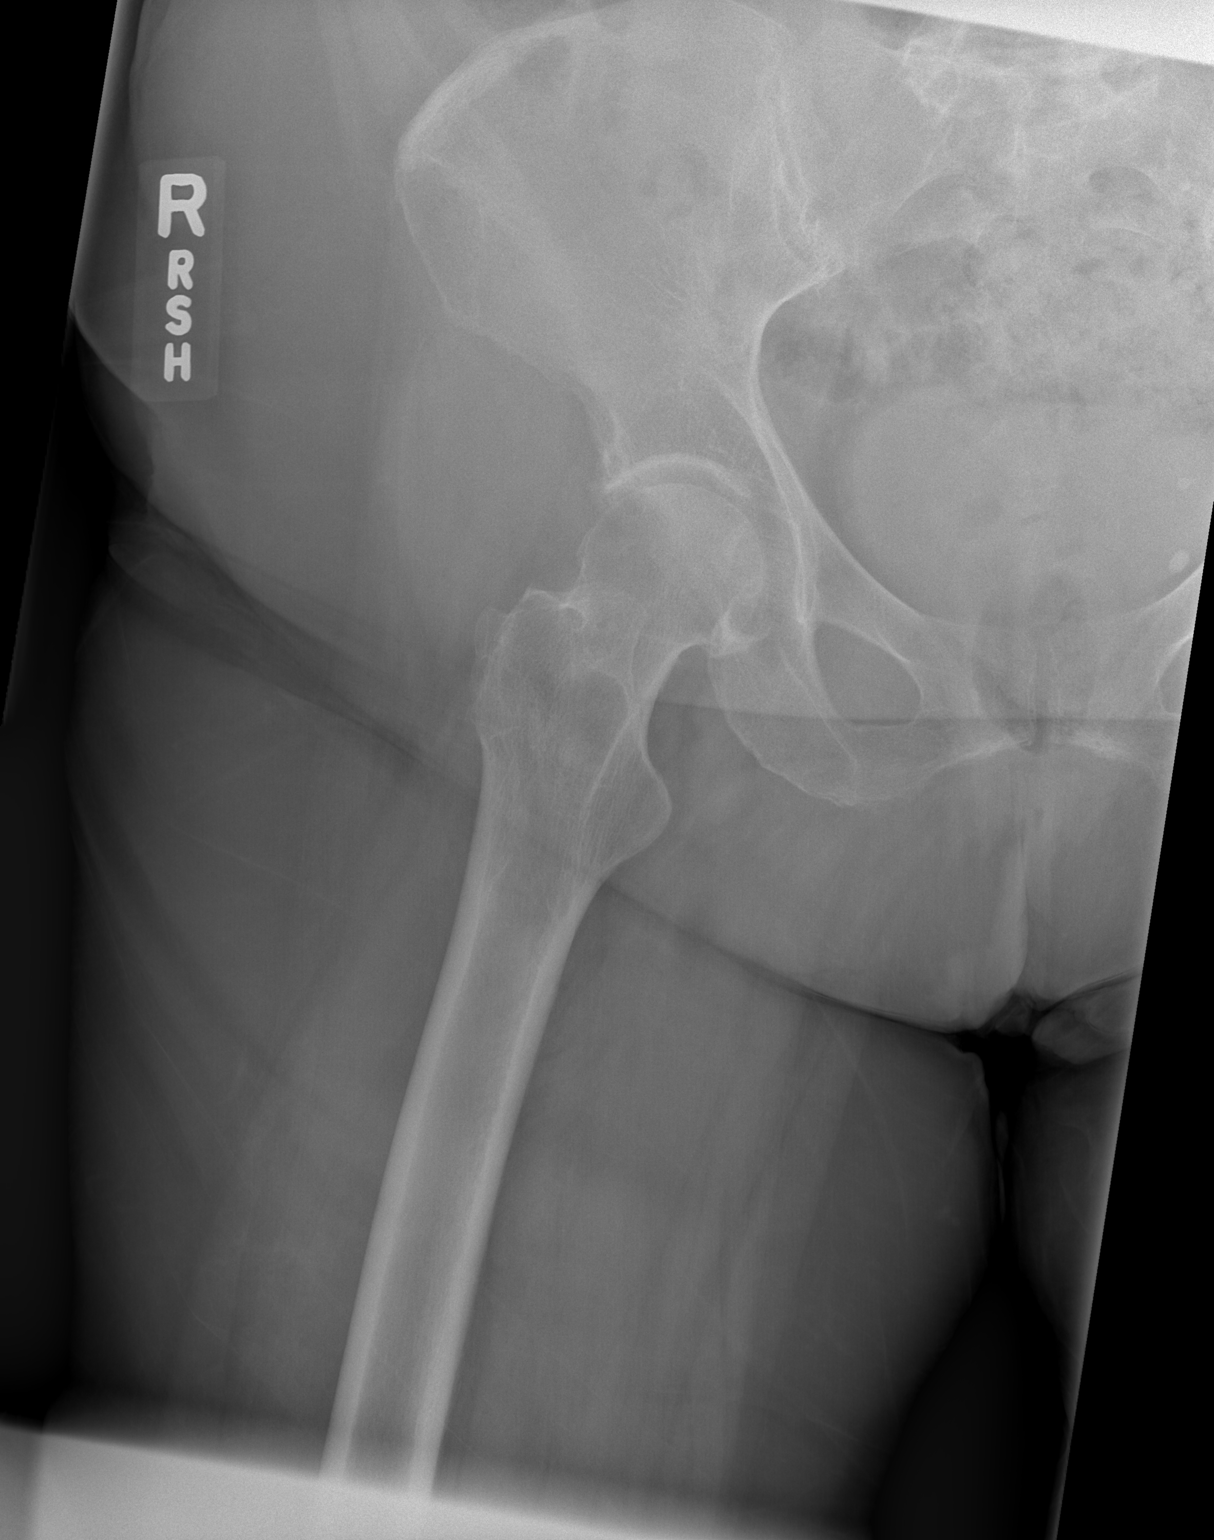

[3 of 3 positions shown; findings below may reference images not displayed]

FINDINGS: Generalized osteopenia. No acute fracture or dislocation. Mild
osteoarthritis of right hip. Lower lumbar spine spondylosis.
IMPRESSION: No acute osseous injury of the right hip. Given the patient's age
and osteopenia, if there is persistent clinical concern for an
occult hip fracture, a MRI of the hip is recommended for increased
sensitivity.
